# Patient Record
Sex: Male | Born: 1940 | Race: Black or African American | Hispanic: No | Marital: Married | Smoking: Former smoker
Health system: Southern US, Community
[De-identification: ages and names within clinical notes are randomized; demographics above are authoritative.]

## PROBLEM LIST (undated history)

## (undated) DIAGNOSIS — M1990 Unspecified osteoarthritis, unspecified site: Secondary | ICD-10-CM

## (undated) DIAGNOSIS — J189 Pneumonia, unspecified organism: Secondary | ICD-10-CM

## (undated) DIAGNOSIS — I10 Essential (primary) hypertension: Secondary | ICD-10-CM

## (undated) DIAGNOSIS — Z973 Presence of spectacles and contact lenses: Secondary | ICD-10-CM

## (undated) DIAGNOSIS — K219 Gastro-esophageal reflux disease without esophagitis: Secondary | ICD-10-CM

## (undated) DIAGNOSIS — E119 Type 2 diabetes mellitus without complications: Secondary | ICD-10-CM

## (undated) DIAGNOSIS — J342 Deviated nasal septum: Secondary | ICD-10-CM

## (undated) DIAGNOSIS — C801 Malignant (primary) neoplasm, unspecified: Secondary | ICD-10-CM

## (undated) DIAGNOSIS — S83206A Unspecified tear of unspecified meniscus, current injury, right knee, initial encounter: Secondary | ICD-10-CM

## (undated) DIAGNOSIS — G4730 Sleep apnea, unspecified: Secondary | ICD-10-CM

## (undated) HISTORY — PX: OTHER SURGICAL HISTORY: SHX169

## (undated) HISTORY — PX: BACK SURGERY: SHX140

---

## 1999-02-12 ENCOUNTER — Ambulatory Visit (HOSPITAL_COMMUNITY): Admission: RE | Admit: 1999-02-12 | Discharge: 1999-02-12 | Payer: Self-pay | Admitting: Gastroenterology

## 1999-02-12 ENCOUNTER — Encounter (INDEPENDENT_AMBULATORY_CARE_PROVIDER_SITE_OTHER): Payer: Self-pay | Admitting: Specialist

## 2001-04-21 ENCOUNTER — Ambulatory Visit (HOSPITAL_BASED_OUTPATIENT_CLINIC_OR_DEPARTMENT_OTHER): Admission: RE | Admit: 2001-04-21 | Discharge: 2001-04-21 | Payer: Self-pay | Admitting: Geriatric Medicine

## 2001-12-28 ENCOUNTER — Encounter: Payer: Self-pay | Admitting: Geriatric Medicine

## 2001-12-28 ENCOUNTER — Encounter: Admission: RE | Admit: 2001-12-28 | Discharge: 2001-12-28 | Payer: Self-pay | Admitting: Geriatric Medicine

## 2002-10-24 ENCOUNTER — Encounter (INDEPENDENT_AMBULATORY_CARE_PROVIDER_SITE_OTHER): Payer: Self-pay | Admitting: *Deleted

## 2002-10-24 ENCOUNTER — Ambulatory Visit (HOSPITAL_BASED_OUTPATIENT_CLINIC_OR_DEPARTMENT_OTHER): Admission: RE | Admit: 2002-10-24 | Discharge: 2002-10-24 | Payer: Self-pay | Admitting: Otolaryngology

## 2005-12-22 ENCOUNTER — Encounter: Admission: RE | Admit: 2005-12-22 | Discharge: 2005-12-22 | Payer: Self-pay | Admitting: Geriatric Medicine

## 2006-02-22 ENCOUNTER — Encounter: Admission: RE | Admit: 2006-02-22 | Discharge: 2006-02-22 | Payer: Self-pay | Admitting: Geriatric Medicine

## 2008-07-20 DIAGNOSIS — C801 Malignant (primary) neoplasm, unspecified: Secondary | ICD-10-CM

## 2008-07-20 HISTORY — PX: PROSTATECTOMY: SHX69

## 2008-07-20 HISTORY — DX: Malignant (primary) neoplasm, unspecified: C80.1

## 2008-10-29 ENCOUNTER — Encounter (INDEPENDENT_AMBULATORY_CARE_PROVIDER_SITE_OTHER): Payer: Self-pay | Admitting: Urology

## 2008-10-29 ENCOUNTER — Inpatient Hospital Stay (HOSPITAL_COMMUNITY): Admission: RE | Admit: 2008-10-29 | Discharge: 2008-10-30 | Payer: Self-pay | Admitting: Urology

## 2010-03-24 ENCOUNTER — Emergency Department (HOSPITAL_COMMUNITY): Admission: EM | Admit: 2010-03-24 | Discharge: 2010-03-25 | Payer: Self-pay | Admitting: Emergency Medicine

## 2010-03-27 ENCOUNTER — Encounter: Admission: RE | Admit: 2010-03-27 | Discharge: 2010-03-27 | Payer: Self-pay | Admitting: Orthopedic Surgery

## 2010-09-29 ENCOUNTER — Other Ambulatory Visit: Payer: Self-pay | Admitting: Urology

## 2010-09-29 ENCOUNTER — Encounter (HOSPITAL_COMMUNITY): Payer: Medicare Other

## 2010-09-29 ENCOUNTER — Other Ambulatory Visit (HOSPITAL_COMMUNITY): Payer: Self-pay | Admitting: Urology

## 2010-09-29 ENCOUNTER — Ambulatory Visit (HOSPITAL_COMMUNITY)
Admission: RE | Admit: 2010-09-29 | Discharge: 2010-09-29 | Disposition: A | Discharge status: Home / Self Care | Payer: Medicare Other | Source: Ambulatory Visit | Attending: Urology | Admitting: Urology

## 2010-09-29 DIAGNOSIS — Z01818 Encounter for other preprocedural examination: Secondary | ICD-10-CM

## 2010-09-29 DIAGNOSIS — R9431 Abnormal electrocardiogram [ECG] [EKG]: Secondary | ICD-10-CM | POA: Insufficient documentation

## 2010-09-29 DIAGNOSIS — Z01810 Encounter for preprocedural cardiovascular examination: Secondary | ICD-10-CM | POA: Insufficient documentation

## 2010-09-29 DIAGNOSIS — Z01812 Encounter for preprocedural laboratory examination: Secondary | ICD-10-CM | POA: Insufficient documentation

## 2010-09-29 LAB — BASIC METABOLIC PANEL
CO2: 27 mEq/L (ref 19–32)
Chloride: 103 mEq/L (ref 96–112)
Creatinine, Ser: 1.24 mg/dL (ref 0.4–1.5)
GFR calc Af Amer: >60 mL/min (ref >60)
Glucose, Bld: 121 mg/dL — ABNORMAL HIGH (ref 70–99)
Sodium: 138 mEq/L (ref 135–145)

## 2010-09-29 LAB — CBC
MCH: 31.3 pg (ref 26.0–34.0)
MCHC: 34 g/dL (ref 30.0–36.0)
MCV: 91.9 fL (ref 78.0–100.0)
Platelets: 143 10*3/uL — ABNORMAL LOW (ref 150–400)
RBC: 4.8 MIL/uL (ref 4.22–5.81)

## 2010-09-29 LAB — SURGICAL PCR SCREEN: MRSA, PCR: NEGATIVE

## 2010-10-07 ENCOUNTER — Ambulatory Visit: Admission: RE | Admit: 2010-10-07 | Payer: Self-pay | Source: Home / Self Care | Admitting: Urology

## 2010-10-07 ENCOUNTER — Observation Stay (HOSPITAL_COMMUNITY)
Admission: RE | Admit: 2010-10-07 | Discharge: 2010-10-08 | Disposition: A | Discharge status: Home / Self Care | Payer: Medicare Other | Source: Ambulatory Visit | Attending: Urology | Admitting: Urology

## 2010-10-07 DIAGNOSIS — Z01811 Encounter for preprocedural respiratory examination: Secondary | ICD-10-CM | POA: Insufficient documentation

## 2010-10-07 DIAGNOSIS — Z79899 Other long term (current) drug therapy: Secondary | ICD-10-CM | POA: Insufficient documentation

## 2010-10-07 DIAGNOSIS — I10 Essential (primary) hypertension: Secondary | ICD-10-CM | POA: Insufficient documentation

## 2010-10-07 DIAGNOSIS — E119 Type 2 diabetes mellitus without complications: Secondary | ICD-10-CM | POA: Insufficient documentation

## 2010-10-07 DIAGNOSIS — G4733 Obstructive sleep apnea (adult) (pediatric): Secondary | ICD-10-CM | POA: Insufficient documentation

## 2010-10-07 DIAGNOSIS — N393 Stress incontinence (female) (male): Principal | ICD-10-CM | POA: Insufficient documentation

## 2010-10-07 DIAGNOSIS — Z01812 Encounter for preprocedural laboratory examination: Secondary | ICD-10-CM | POA: Insufficient documentation

## 2010-10-07 LAB — TYPE AND SCREEN: ABO/RH(D): B POS

## 2010-10-07 LAB — BASIC METABOLIC PANEL
BUN: 14 mg/dL (ref 6–23)
GFR calc non Af Amer: >60 mL/min (ref >60)
Glucose, Bld: 128 mg/dL — ABNORMAL HIGH (ref 70–99)
Potassium: 3.9 mEq/L (ref 3.5–5.1)

## 2010-10-07 LAB — GLUCOSE, CAPILLARY: Glucose-Capillary: 147 mg/dL — ABNORMAL HIGH (ref 70–99)

## 2010-10-07 LAB — HEMOGLOBIN AND HEMATOCRIT, BLOOD: Hemoglobin: 13.4 g/dL (ref 13.0–17.0)

## 2010-10-08 LAB — GLUCOSE, CAPILLARY
Glucose-Capillary: 171 mg/dL — ABNORMAL HIGH (ref 70–99)
Glucose-Capillary: 165 mg/dL — ABNORMAL HIGH (ref 70–99)

## 2010-10-08 LAB — BASIC METABOLIC PANEL
Calcium: 8.7 mg/dL (ref 8.4–10.5)
Chloride: 102 mEq/L (ref 96–112)
Creatinine, Ser: 1.21 mg/dL (ref 0.4–1.5)
GFR calc Af Amer: >60 mL/min (ref >60)
GFR calc non Af Amer: 59 mL/min — ABNORMAL LOW (ref >60)

## 2010-10-08 LAB — HEMOGLOBIN AND HEMATOCRIT, BLOOD
HCT: 36.7 % — ABNORMAL LOW (ref 39.0–52.0)
Hemoglobin: 12.5 g/dL — ABNORMAL LOW (ref 13.0–17.0)

## 2010-10-14 NOTE — Op Note (Signed)
NAME:  SHAFIQ, LARCH NO.:  [id number]  MEDICAL RECORD NO.:  [id number]           PATIENT TYPE:  O  LOCATION:  DAYL                         FACILITY:  [facility]  PHYSICIAN:  Martina Sinner, MD DATE OF BIRTH:  November 01, 1940  DATE OF PROCEDURE:  10/07/2010 DATE OF DISCHARGE: Surgical Assistant: Delia Chimes                              OPERATIVE REPORT   PREOPERATIVE DIAGNOSIS:  Stress urinary incontinence.  POSTOPERATIVE DIAGNOSIS:  Stress urinary incontinence.  SURGERY:  Male sling plus cystoscopy.  INDICATIONS FOR PROCEDURE:  Mr. Sofranko has the above diagnosis.  He consented to the above procedure.  DESCRIPTION OF PROCEDURE:  The patient is prepped and draped in usual fashion.  Preoperative laboratory tests were normal.  Preoperative antibiotics were given.  Extra care was taken with leg positioning to minimize the risk of compartment syndrome, neuropathy, and DVT.  He had a short perineum.  I felt his adductor tendon easily, especially on the left and planned my incisions.  I made approximately 6 cm perineal incision that was extended 2 cm into the scrotal area due to the short perineum.  A 14-French Foley catheter had been inserted.  I dissected sharply and with cautery down through the subcutaneous tissue, mobilized the soft tissue from the underlying bulbous spongiosis muscle. The usual retractors were utilized.  I split the bulbous spongiosis muscle in the midline and mobilized it modestly from the overlying muscle and could feel the tendon and the perineum.  I was very diligent in marking the perineal body with the 3-0 Vicryl relative to the rectum. Appropriate attachments were taken down.  A 3-0 Vicryl was placed through the tendon at this point.  I then spent a lot of time marking the upper aspect of the obturator foramen below the adductor tendon.  I could feel the tendon bilaterally. I made an appropriate incision after using marking pen.   I used a trocar needle to hit the bone medially and passed it straight through the obturator foramen.  Visually, I placed the pulp my left index finger in the perineum at the triangle between the inferior rami and the urethra.  I then passed the AdVance needle with a double pop through the obturator foramen staying at an angle to stay away from the adductor tendon.  I rotated medially on to the pulp of my index finger.  This went very nicely.  I passed the sling in an anatomic position and brought it up through some of its length.  I did the exact same procedure on the other side.  I then brought the 3-0 Vicryl suture through the middle aspect of the sling.  I used my folding technique to place the sling right down on the perineal tendon and gently pulled on both straps causing cephalad rotation of the bulbar urethra.  This went very nicely.  I had cystoscoped the patient after marking the perineal tendon early in the case and with the appropriate maneuver, there was nice winking of the bladder neck and sphincter.  The rest of the penile urethra was normal.  Bladder mucosa and trigone were also normal.  After gently pulling down on  the sling, I re-cystoscoped the patient. There was nice coaptation of the sphincter.  There is no question the sling was acting at the level of the sphincter and not distally.  There was appropriate relative straightening of the bulbar urethra.  Copious irrigation was utilized.  I did an anatomic closure with 3 layers of 3-0 Vicryl followed by 4-0 subcuticularly.  I cut the sling straps distal to the blue dot and then brought them out through separate stab incisions made 2 cm posterior to the initial incisions.  With a hemostat, I brought the straps through the second incision.  I cut below the blue dots and removed the sheath.  I cut the sling at the skin level.  Four inguinal incisions were closed with interrupted 4-0 Vicryl.  Dermabond was applied  to all incisions.  Perineal fluff dressing was applied to maximize hemostasis post procedure.  Leg position was good at the end of the case.  Foley catheter was draining clear urine at the end of the case.  I was very pleased with Mr. Moravek operation, and hopefully he reaches his treatment goal.          ______________________________ Martina Sinner, MD     SAM/MEDQ  D:  10/07/2010  T:  10/07/2010  Job:  [id number]  Electronically Signed by Alfredo Martinez MD on 10/14/2010 12:55:58 PM

## 2010-10-29 LAB — COMPREHENSIVE METABOLIC PANEL
ALT: 28 U/L (ref 0–53)
AST: 22 U/L (ref 0–37)
Albumin: 4 g/dL (ref 3.5–5.2)
Alkaline Phosphatase: 61 U/L (ref 39–117)
BUN: 10 mg/dL (ref 6–23)
CO2: 27 mEq/L (ref 19–32)
Calcium: 9.7 mg/dL (ref 8.4–10.5)
Chloride: 110 mEq/L (ref 96–112)
Creatinine, Ser: 1.13 mg/dL (ref 0.4–1.5)
GFR calc Af Amer: >60 mL/min (ref >60)
GFR calc non Af Amer: >60 mL/min (ref >60)
Glucose, Bld: 142 mg/dL — ABNORMAL HIGH (ref 70–99)
Potassium: 4.3 mEq/L (ref 3.5–5.1)
Sodium: 143 mEq/L (ref 135–145)
Total Bilirubin: 0.8 mg/dL (ref 0.3–1.2)
Total Protein: 6.9 g/dL (ref 6.0–8.3)

## 2010-10-29 LAB — DIFFERENTIAL
Basophils Absolute: 0 10*3/uL (ref 0.0–0.1)
Basophils Absolute: 0 10*3/uL (ref 0.0–0.1)
Basophils Absolute: 0.1 10*3/uL (ref 0.0–0.1)
Basophils Relative: 0 % (ref 0–1)
Basophils Relative: 0 % (ref 0–1)
Basophils Relative: 1 % (ref 0–1)
Eosinophils Absolute: 0 10*3/uL (ref 0.0–0.7)
Eosinophils Absolute: 0 10*3/uL (ref 0.0–0.7)
Eosinophils Absolute: 0.1 10*3/uL (ref 0.0–0.7)
Eosinophils Relative: 0 % (ref 0–5)
Eosinophils Relative: 0 % (ref 0–5)
Eosinophils Relative: 2 % (ref 0–5)
Lymphocytes Relative: 17 % (ref 12–46)
Lymphocytes Relative: 27 % (ref 12–46)
Lymphocytes Relative: 8 % — ABNORMAL LOW (ref 12–46)
Lymphs Abs: 1.1 10*3/uL (ref 0.7–4.0)
Lymphs Abs: 1.5 10*3/uL (ref 0.7–4.0)
Lymphs Abs: 1.7 10*3/uL (ref 0.7–4.0)
Monocytes Absolute: 0.5 10*3/uL (ref 0.1–1.0)
Monocytes Absolute: 0.9 10*3/uL (ref 0.1–1.0)
Monocytes Absolute: 1.2 10*3/uL — ABNORMAL HIGH (ref 0.1–1.0)
Monocytes Relative: 13 % — ABNORMAL HIGH (ref 3–12)
Monocytes Relative: 6 % (ref 3–12)
Monocytes Relative: 8 % (ref 3–12)
Neutro Abs: 12.4 10*3/uL — ABNORMAL HIGH (ref 1.7–7.7)
Neutro Abs: 3.8 10*3/uL (ref 1.7–7.7)
Neutro Abs: 6.2 10*3/uL (ref 1.7–7.7)
Neutrophils Relative %: 62 % (ref 43–77)
Neutrophils Relative %: 70 % (ref 43–77)
Neutrophils Relative %: 86 % — ABNORMAL HIGH (ref 43–77)

## 2010-10-29 LAB — CBC
HCT: 38.4 % — ABNORMAL LOW (ref 39.0–52.0)
HCT: 41.5 % (ref 39.0–52.0)
HCT: 42.2 % (ref 39.0–52.0)
Hemoglobin: 13.1 g/dL (ref 13.0–17.0)
Hemoglobin: 13.9 g/dL (ref 13.0–17.0)
Hemoglobin: 14.1 g/dL (ref 13.0–17.0)
MCHC: 33.3 g/dL (ref 30.0–36.0)
MCHC: 33.5 g/dL (ref 30.0–36.0)
MCHC: 34.2 g/dL (ref 30.0–36.0)
MCV: 95.5 fL (ref 78.0–100.0)
MCV: 96.4 fL (ref 78.0–100.0)
MCV: 97.5 fL (ref 78.0–100.0)
Platelets: 119 10*3/uL — ABNORMAL LOW (ref 150–400)
Platelets: 132 10*3/uL — ABNORMAL LOW (ref 150–400)
Platelets: 136 10*3/uL — ABNORMAL LOW (ref 150–400)
RBC: 3.98 MIL/uL — ABNORMAL LOW (ref 4.22–5.81)
RBC: 4.33 MIL/uL (ref 4.22–5.81)
RBC: 4.35 MIL/uL (ref 4.22–5.81)
RDW: 14.1 % (ref 11.5–15.5)
RDW: 14.1 % (ref 11.5–15.5)
RDW: 14.8 % (ref 11.5–15.5)
WBC: 14.5 10*3/uL — ABNORMAL HIGH (ref 4.0–10.5)
WBC: 6.2 10*3/uL (ref 4.0–10.5)
WBC: 8.9 10*3/uL (ref 4.0–10.5)

## 2010-10-29 LAB — BASIC METABOLIC PANEL
BUN: 5 mg/dL — ABNORMAL LOW (ref 6–23)
BUN: 8 mg/dL (ref 6–23)
CO2: 27 mEq/L (ref 19–32)
CO2: 27 mEq/L (ref 19–32)
Calcium: 8 mg/dL — ABNORMAL LOW (ref 8.4–10.5)
Calcium: 8.5 mg/dL (ref 8.4–10.5)
Chloride: 109 mEq/L (ref 96–112)
Chloride: 109 mEq/L (ref 96–112)
Creatinine, Ser: 1.05 mg/dL (ref 0.4–1.5)
Creatinine, Ser: 1.07 mg/dL (ref 0.4–1.5)
GFR calc Af Amer: >60 mL/min (ref >60)
GFR calc Af Amer: >60 mL/min (ref >60)
GFR calc non Af Amer: >60 mL/min (ref >60)
GFR calc non Af Amer: >60 mL/min (ref >60)
Glucose, Bld: 168 mg/dL — ABNORMAL HIGH (ref 70–99)
Glucose, Bld: 182 mg/dL — ABNORMAL HIGH (ref 70–99)
Potassium: 4 mEq/L (ref 3.5–5.1)
Potassium: 4.4 mEq/L (ref 3.5–5.1)
Sodium: 139 mEq/L (ref 135–145)
Sodium: 140 mEq/L (ref 135–145)

## 2010-10-29 LAB — TYPE AND SCREEN
ABO/RH(D): B POS
Antibody Screen: NEGATIVE

## 2010-10-29 LAB — ABO/RH: ABO/RH(D): B POS

## 2010-12-02 NOTE — Op Note (Signed)
[name], [name]           ACCOUNT NO.:  [id number]   MEDICAL RECORD NO.:  [id number]          PATIENT TYPE:  INP   LOCATION:  1443                         [facility]ACILITY:  [facility]   PHYSICIAN:  Valetta Fuller, M.D.  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   PROCEDURE PERFORMED:  Robotic-assisted laparoscopic radical retropubic  prostatectomy with bilateral pelvic lymph node dissections.   SURGEON:  Valetta Fuller, M.D.   ASSISTANT:  Delia Chimes, nurse practitioner.   ANESTHESIA:  General endotracheal   INDICATIONS:  [name] is currently 70 years of age.  The patient  was seen and evaluated several times over the last year or two with a  family history of prostate cancer.  Prior PSA has been within normal  limits.  Recently, the patient's PSA increased.  His PSA went up to 5.4.  Ultrasound revealed a 30-g prostate.  All left-sided biopsies were  negative, but on the right, the patient had every core involved with  Gleason's  6-8 adenocarcinoma of the prostate.  Primary Gleason  component was score 3 with secondary components of the 3 through 5.  The  patient underwent extensive counseling with regard to treatment options.  The patient elected to proceed with a robotic prostatectomy.  The  patient appeared to understand the advantages and disadvantages of this  procedure as well as the complications.  We specifically spent  considerable time discussing incontinence and sexual dysfunction issues.  He was felt to be a candidate only for unilateral nerve spare given the  situation.  The patient now presents for the procedure.  He has done a  mechanical bowel prep and has received perioperative  Unasyn with  placement of compression boots.   TECHNIQUE AND FINDINGS:  The patient was brought to the operating room.  There, he was placed in the mid-lithotomy position.  All extremities  were carefully padded.  The patient had successful induction of general  endotracheal anesthesia.  He was then secured carefully to the operative  table, placed in steep Trendelenburg position and prepped and draped in  the usual manner.  Appropriate surgical time-out was then performed.   Foley catheter was placed sterilely on the field.  A camera port  incision was chosen 18 cm above the pubic symphysis just to the left of  the umbilicus.  A standard open Hassan technique was utilized and  abdominal entry as well as initial insufflation occurred without  incident.  All other trocars were placed with direct visual guidance  including three 8-mm robotic trocars and a 12 and 5-mm assist port.  Once all trocars were positioned, the surgical cart was docked.  The  bladder was filled to help identify it, and the space of Retzius was  developed with electrocautery scissors and blunt dissecting technique.  Superficial fascia over the bladder neck and endopelvic fascia was taken  with electrocautery scissors.  Endopelvic fascia was then incised from  base to apex.  Levator musculature was swept off the apex of the  prostate, isolating the dorsal venous complex which was then stapled.  Hemostasis was excellent.  Anterior bladder neck was identified with the  aid of a Foley balloon and transected with electrocautery scissors.  Indigo carmine was given, and we were well away from the ureteral  orifices.  There was no evidence of middle lobe and the posterior  bladder neck was then transected.  The vas deferens and seminal vesicles  were then found posteriorly and individually dissected free.  The plane  between the rectum and prostate was then developed with a combination of  sharp and blunt dissecting  technique.   The patient was felt to be a candidate for unilateral nerve spare.  Superficial fascia on the prostate was incised anterior laterally and  then swept posteriorly.  The neurovascular plane was easily established  and then developed from apex to the base of the prostate isolating the  neurovascular bundle on that side.  The pedicle of prostate was then  taken down with locking clips.  On the right side of the prostate, we  took the tissue widely incorporating the pedicle along with the  neurovascular bundle tissue as laterally as possible up to the apex of  the prostate.  The anterior urethra was then transected leaving a nice  urethral stump, and the posterior urethra was then transected with cold  scissors.  A small amount of recti urethralis tissue was incised, and  the prostate specimen was removed from the pelvis.  Hemostasis remained  excellent.  The pelvis was irrigated and rectal insufflation revealed no  evidence of rectal injury.   Bilateral pelvic lymph node dissection was then performed.  The  obturator packets were taken up to the bifurcation of the iliac artery.  Obturator nerves were identified bilaterally and preserved.  Of note, we  also sent some of the superficial fat off the prostate and bladder neck  for analysis for any possible lymph node tissue.  Clips were used for  small lymphatic channels and small venous vessels.   Attention was then turned towards reconstruction.  The posterior bladder  neck and posterior urethra were reapproximated with interrupted Vicryl  suture to reapproximate those structures and to reduce tension.  The  bladder neck did not require any reconstruction.  The rest of the  anastomosis was done with a double-armed 3-0 Monocryl suture and in a  360-degree running manner.  New catheter was placed, and irrigation  revealed clear blue urine without evidence of extravasation.  A pelvic  drain was placed through one of the trocar  ports and secured to the  skin.  This was placed in the retropubic space in the area of  anastomosis.  The specimen was placed an Endopouch retrieval bag.  The  12-mm trocar site was closed with a Vicryl suture with the aid of a  suture passer.  All other trocars were taken with direct visual  guidance.  The camera port incision was extended slightly to allow for  removal of the specimen.  That fascia was then closed with a running #1  Vicryl suture.  All incisions were infiltrated with Marcaine and closed  with clips.  Estimated blood loss 100 mL.  The patient tolerated things  well.  There were no obvious complications or difficulties, and he was  brought to the recovery room in stable condition.  Valetta Fuller, M.D.  Electronically Signed     DSG/MEDQ  D:  10/29/2008  T:  10/29/2008  Job:  [id number]

## 2010-12-02 NOTE — Discharge Summary (Signed)
[name], [name]           ACCOUNT NO.:  [id number]   MEDICAL RECORD NO.:  [id number]          PATIENT TYPE:  INP   LOCATION:  1443                         [facility]ACILITY:  [facility]   PHYSICIAN:  Valetta Fuller, M.D.  DATE OF BIRTH:  1940-09-04   DATE OF ADMISSION:  10/29/2008  DATE OF DISCHARGE:  10/30/2008                               DISCHARGE SUMMARY   ADMISSION DIAGNOSIS:  Intermediate risk clinical T1c adenocarcinoma of  the prostate.   DISCHARGE DIAGNOSIS:  Intermediate risk clinical T1c adenocarcinoma of  the prostate.   PROCEDURES:  1. Robotic-assisted laparoscopic radical prostatectomy (left side      nerve sparing).  2. Bilateral pelvic lymphadenectomy.   HISTORY AND PHYSICAL:  For full details, please see admission history  and physical.  Briefly, [name] is a 70 year old gentleman who  was found to have clinically localized adenocarcinoma of the prostate.  After careful consideration regarding management options for treatment,  he elected to proceed with surgical therapy and a robotic-assisted  radical prostatectomy.   HOSPITAL COURSE:  On October 29, 2008, he was taken to the operating room  and underwent the above-named procedure which he tolerated well and  without complications.  Postoperatively, he was able to be transferred  to a regular hospital room following recovery from anesthesia.  He was  able to begin ambulation that evening.  He remained hemodynamically  stable.  His postoperative hematocrit was 42.2.  On the morning of  postoperative day #1, his hematocrit was also found to be stable at  38.4.  he maintained excellent urine output with minimal output from his  pelvic drain, therefore, the pelvic drain was removed.  He was placed on  a clear liquid diet and continued to ambulate.  Upon reevaluation in the  afternoon of postoperative day #1, his urine output, blood pressure and  pulse all remained stable.  He was also able to tolerate his  clear  liquid diet.  Therefore, he was felt to be stable for discharge as he  met all discharge criteria.   DISPOSITION:  Home.   DISCHARGE MEDICATIONS:  He was instructed to resume his regular home  medications including amlodipine, Lipitor, fosinopril, and metoprolol.  He was instructed to hold all multivitamins, aspirins and herbal  supplements for 10 days postoperatively.  In addition, he was provided a  prescription for Vicodin to take as needed for pain and told to use  Colace as a stool softener.  He was also given a prescription for Cipro  to begin 2 days prior to his return for removal of a Foley catheter.   DISCHARGE INSTRUCTIONS:  He was instructed to be ambulatory but  specifically told to refrain from any heavy lifting, strenuous activity  or driving.  He was instructed on routine Foley catheter care and told  to gradually advance his diet over the course of the next few days.   FOLLOW UP:  He will follow up in 7 to 10 days for Foley catheter removal  and skin staple removal.      Delia Chimes, NP      Valetta Fuller, M.D.  Electronically Signed    MA/MEDQ  D:  10/30/2008  T:  10/30/2008  Job:  [id number]

## 2010-12-05 NOTE — Op Note (Signed)
   NAME:  [name], [name]                     ACCOUNT NO.:  [id number]   MEDICAL RECORD NO.:  [id number]                   PATIENT TYPE:  AMB   LOCATION:  DSC                                  FACILITY:  [facility]   PHYSICIAN:  Kristine Garbe. Ezzard Standing, M.D.         DATE OF BIRTH:  [birth year]   DATE OF PROCEDURE:  10/24/2002  DATE OF DISCHARGE:                                 OPERATIVE REPORT   PREOPERATIVE DIAGNOSIS:  Left neck node.   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   OPERATION PERFORMED:  Excision of posterior left neck node.   SURGEON:  Kristine Garbe. Ezzard Standing, M.D.   ANESTHESIA:  General endotracheal,   COMPLICATIONS:  None.   INDICATIONS FOR PROCEDURE:  The patient is a 70 year old gentleman who has  noticed a nodule in the left posterior neck for about two months now.  He is  otherwise asymptomatic.  On examination he has an approximately 0.5 to 2 cm  soft node lying just posterior to the sternocleidomastoid muscle of the left  upper neck.  The patient was taken to the operating room at this time for  excisional biopsy.   DESCRIPTION OF PROCEDURE:  The left neck was prepped with Betadine solution,  draped out with sterile towels.  The proposed incision site was marked and  injected with Xylocaine with epinephrine.  An incision was made directly  over the node.  Dissection was carried down through the subcutaneous tissue  down to the posterior aspect of the sternocleidomastoid muscle.  There is a  mass consistent with a probable lipoma within the posterior aspect of the  left sternocleidomastoid muscle.  This was dissected out and sent to  pathology.  There is no other palpable nodules.  Hemostasis was obtained  with the cautery.  The wound was closed with 3-0 chromic suture  subcutaneously and a running 5-0 Vicryl subcuticular stitch.  Steri-Strips  were placed.  The patient was awakened from anesthesia and transferred to  the  recovery room postoperatively doing well.   DISPOSITION:  The patient will be discharged to home later this morning.  Will have him follow up in my office in one week for a recheck and review  pathology.  He was given Tylenol #3 one to two every four hours as needed  for pain.                                               Kristine Garbe. Ezzard Standing, M.D.   CEN/MEDQ  D:  10/24/2002  T:  10/24/2002  Job:  [id number]   cc:   Hal T. Stoneking, M.D.  [address] [place], [place] [address]  Fax: [phone]

## 2010-12-09 ENCOUNTER — Other Ambulatory Visit: Payer: Self-pay | Admitting: Geriatric Medicine

## 2010-12-09 DIAGNOSIS — M6281 Muscle weakness (generalized): Secondary | ICD-10-CM

## 2010-12-17 ENCOUNTER — Ambulatory Visit
Admission: RE | Admit: 2010-12-17 | Discharge: 2010-12-17 | Disposition: A | Discharge status: Home / Self Care | Payer: Medicare Other | Source: Ambulatory Visit | Attending: Geriatric Medicine | Admitting: Geriatric Medicine

## 2010-12-17 DIAGNOSIS — M6281 Muscle weakness (generalized): Secondary | ICD-10-CM

## 2010-12-17 MED ORDER — GADOBENATE DIMEGLUMINE 529 MG/ML IV SOLN
20.0000 mL | Freq: Once | INTRAVENOUS | Status: AC | PRN
  Administered 2010-12-17: 20 mL via INTRAVENOUS

## 2011-02-12 ENCOUNTER — Ambulatory Visit: Payer: Medicare Other | Attending: Geriatric Medicine | Admitting: Rehabilitative and Restorative Service Providers"

## 2011-02-12 DIAGNOSIS — IMO0001 Reserved for inherently not codable concepts without codable children: Secondary | ICD-10-CM | POA: Insufficient documentation

## 2011-02-12 DIAGNOSIS — M6281 Muscle weakness (generalized): Secondary | ICD-10-CM | POA: Insufficient documentation

## 2011-02-25 ENCOUNTER — Ambulatory Visit: Payer: Medicare Other | Attending: Geriatric Medicine | Admitting: Rehabilitative and Restorative Service Providers"

## 2011-02-25 DIAGNOSIS — IMO0001 Reserved for inherently not codable concepts without codable children: Secondary | ICD-10-CM | POA: Insufficient documentation

## 2011-02-25 DIAGNOSIS — M6281 Muscle weakness (generalized): Secondary | ICD-10-CM | POA: Insufficient documentation

## 2011-02-27 ENCOUNTER — Ambulatory Visit: Payer: Medicare Other | Admitting: Rehabilitative and Restorative Service Providers"

## 2011-03-03 ENCOUNTER — Ambulatory Visit: Payer: Medicare Other | Admitting: Rehabilitative and Restorative Service Providers"

## 2011-03-05 ENCOUNTER — Ambulatory Visit: Payer: Medicare Other | Admitting: Rehabilitative and Restorative Service Providers"

## 2011-03-10 ENCOUNTER — Ambulatory Visit: Payer: Medicare Other

## 2011-03-12 ENCOUNTER — Ambulatory Visit: Payer: Medicare Other

## 2011-03-17 ENCOUNTER — Ambulatory Visit: Payer: Medicare Other | Admitting: Rehabilitative and Restorative Service Providers"

## 2011-03-18 ENCOUNTER — Ambulatory Visit: Payer: Medicare Other | Admitting: Rehabilitative and Restorative Service Providers"

## 2011-03-31 ENCOUNTER — Ambulatory Visit: Payer: Medicare Other | Attending: Geriatric Medicine | Admitting: Rehabilitative and Restorative Service Providers"

## 2011-03-31 DIAGNOSIS — M6281 Muscle weakness (generalized): Secondary | ICD-10-CM | POA: Insufficient documentation

## 2011-03-31 DIAGNOSIS — IMO0001 Reserved for inherently not codable concepts without codable children: Secondary | ICD-10-CM | POA: Insufficient documentation

## 2011-04-02 ENCOUNTER — Ambulatory Visit: Payer: Medicare Other | Admitting: Rehabilitative and Restorative Service Providers"

## 2011-04-06 ENCOUNTER — Ambulatory Visit: Payer: Medicare Other

## 2011-04-08 ENCOUNTER — Ambulatory Visit: Payer: Medicare Other

## 2011-04-13 ENCOUNTER — Ambulatory Visit: Payer: Medicare Other | Admitting: Rehabilitative and Restorative Service Providers"

## 2011-04-15 ENCOUNTER — Ambulatory Visit: Payer: Medicare Other | Admitting: Rehabilitative and Restorative Service Providers"

## 2011-04-22 ENCOUNTER — Ambulatory Visit: Payer: Medicare Other | Attending: Geriatric Medicine | Admitting: Rehabilitative and Restorative Service Providers"

## 2011-04-22 DIAGNOSIS — M6281 Muscle weakness (generalized): Secondary | ICD-10-CM | POA: Insufficient documentation

## 2011-04-22 DIAGNOSIS — IMO0001 Reserved for inherently not codable concepts without codable children: Secondary | ICD-10-CM | POA: Insufficient documentation

## 2011-04-24 ENCOUNTER — Ambulatory Visit: Payer: Medicare Other | Admitting: Rehabilitative and Restorative Service Providers"

## 2011-04-27 ENCOUNTER — Ambulatory Visit: Payer: Medicare Other | Admitting: Rehabilitation

## 2011-04-29 ENCOUNTER — Ambulatory Visit: Payer: Medicare Other | Admitting: Rehabilitative and Restorative Service Providers"

## 2011-05-04 ENCOUNTER — Ambulatory Visit: Payer: Medicare Other | Admitting: Rehabilitative and Restorative Service Providers"

## 2011-05-06 ENCOUNTER — Ambulatory Visit: Payer: Medicare Other | Admitting: Rehabilitative and Restorative Service Providers"

## 2011-08-24 DIAGNOSIS — E119 Type 2 diabetes mellitus without complications: Secondary | ICD-10-CM | POA: Diagnosis not present

## 2011-08-24 DIAGNOSIS — I10 Essential (primary) hypertension: Secondary | ICD-10-CM | POA: Diagnosis not present

## 2011-08-24 DIAGNOSIS — E7800 Pure hypercholesterolemia, unspecified: Secondary | ICD-10-CM | POA: Diagnosis not present

## 2011-08-24 DIAGNOSIS — Z79899 Other long term (current) drug therapy: Secondary | ICD-10-CM | POA: Diagnosis not present

## 2011-08-24 DIAGNOSIS — H40019 Open angle with borderline findings, low risk, unspecified eye: Secondary | ICD-10-CM | POA: Diagnosis not present

## 2011-08-25 DIAGNOSIS — I10 Essential (primary) hypertension: Secondary | ICD-10-CM | POA: Diagnosis not present

## 2011-08-25 DIAGNOSIS — E7800 Pure hypercholesterolemia, unspecified: Secondary | ICD-10-CM | POA: Diagnosis not present

## 2011-08-25 DIAGNOSIS — Z79899 Other long term (current) drug therapy: Secondary | ICD-10-CM | POA: Diagnosis not present

## 2011-08-25 DIAGNOSIS — E119 Type 2 diabetes mellitus without complications: Secondary | ICD-10-CM | POA: Diagnosis not present

## 2011-10-12 DIAGNOSIS — C61 Malignant neoplasm of prostate: Secondary | ICD-10-CM | POA: Diagnosis not present

## 2011-10-13 DIAGNOSIS — H4010X0 Unspecified open-angle glaucoma, stage unspecified: Secondary | ICD-10-CM | POA: Diagnosis not present

## 2011-10-13 DIAGNOSIS — H409 Unspecified glaucoma: Secondary | ICD-10-CM | POA: Diagnosis not present

## 2011-10-15 DIAGNOSIS — C61 Malignant neoplasm of prostate: Secondary | ICD-10-CM | POA: Diagnosis not present

## 2011-10-15 DIAGNOSIS — N529 Male erectile dysfunction, unspecified: Secondary | ICD-10-CM | POA: Diagnosis not present

## 2011-10-15 DIAGNOSIS — N393 Stress incontinence (female) (male): Secondary | ICD-10-CM | POA: Diagnosis not present

## 2012-02-23 DIAGNOSIS — Z79899 Other long term (current) drug therapy: Secondary | ICD-10-CM | POA: Diagnosis not present

## 2012-02-23 DIAGNOSIS — Z0000 Encounter for general adult medical examination without abnormal findings: Secondary | ICD-10-CM | POA: Diagnosis not present

## 2012-02-23 DIAGNOSIS — Z23 Encounter for immunization: Secondary | ICD-10-CM | POA: Diagnosis not present

## 2012-02-23 DIAGNOSIS — I10 Essential (primary) hypertension: Secondary | ICD-10-CM | POA: Diagnosis not present

## 2012-02-23 DIAGNOSIS — E7800 Pure hypercholesterolemia, unspecified: Secondary | ICD-10-CM | POA: Diagnosis not present

## 2012-02-23 DIAGNOSIS — Z1331 Encounter for screening for depression: Secondary | ICD-10-CM | POA: Diagnosis not present

## 2012-02-23 DIAGNOSIS — E119 Type 2 diabetes mellitus without complications: Secondary | ICD-10-CM | POA: Diagnosis not present

## 2012-02-24 DIAGNOSIS — M722 Plantar fascial fibromatosis: Secondary | ICD-10-CM | POA: Diagnosis not present

## 2012-03-03 DIAGNOSIS — M722 Plantar fascial fibromatosis: Secondary | ICD-10-CM | POA: Diagnosis not present

## 2012-03-10 ENCOUNTER — Other Ambulatory Visit: Payer: Self-pay | Admitting: Gastroenterology

## 2012-03-10 DIAGNOSIS — D128 Benign neoplasm of rectum: Secondary | ICD-10-CM | POA: Diagnosis not present

## 2012-03-10 DIAGNOSIS — D129 Benign neoplasm of anus and anal canal: Secondary | ICD-10-CM | POA: Diagnosis not present

## 2012-03-10 DIAGNOSIS — D126 Benign neoplasm of colon, unspecified: Secondary | ICD-10-CM | POA: Diagnosis not present

## 2012-03-10 DIAGNOSIS — Z09 Encounter for follow-up examination after completed treatment for conditions other than malignant neoplasm: Secondary | ICD-10-CM | POA: Diagnosis not present

## 2012-03-10 DIAGNOSIS — Z86010 Personal history of colonic polyps: Secondary | ICD-10-CM | POA: Diagnosis not present

## 2012-03-31 DIAGNOSIS — M722 Plantar fascial fibromatosis: Secondary | ICD-10-CM | POA: Diagnosis not present

## 2012-04-07 DIAGNOSIS — C61 Malignant neoplasm of prostate: Secondary | ICD-10-CM | POA: Diagnosis not present

## 2012-04-12 DIAGNOSIS — H409 Unspecified glaucoma: Secondary | ICD-10-CM | POA: Diagnosis not present

## 2012-04-12 DIAGNOSIS — H4011X0 Primary open-angle glaucoma, stage unspecified: Secondary | ICD-10-CM | POA: Diagnosis not present

## 2012-04-14 DIAGNOSIS — N529 Male erectile dysfunction, unspecified: Secondary | ICD-10-CM | POA: Diagnosis not present

## 2012-04-14 DIAGNOSIS — C61 Malignant neoplasm of prostate: Secondary | ICD-10-CM | POA: Diagnosis not present

## 2012-06-02 DIAGNOSIS — N529 Male erectile dysfunction, unspecified: Secondary | ICD-10-CM | POA: Diagnosis not present

## 2012-06-02 DIAGNOSIS — C61 Malignant neoplasm of prostate: Secondary | ICD-10-CM | POA: Diagnosis not present

## 2012-08-04 DIAGNOSIS — M722 Plantar fascial fibromatosis: Secondary | ICD-10-CM | POA: Diagnosis not present

## 2012-08-26 DIAGNOSIS — B354 Tinea corporis: Secondary | ICD-10-CM | POA: Diagnosis not present

## 2012-08-26 DIAGNOSIS — I10 Essential (primary) hypertension: Secondary | ICD-10-CM | POA: Diagnosis not present

## 2012-08-26 DIAGNOSIS — L989 Disorder of the skin and subcutaneous tissue, unspecified: Secondary | ICD-10-CM | POA: Diagnosis not present

## 2012-08-26 DIAGNOSIS — E119 Type 2 diabetes mellitus without complications: Secondary | ICD-10-CM | POA: Diagnosis not present

## 2012-08-26 DIAGNOSIS — Z79899 Other long term (current) drug therapy: Secondary | ICD-10-CM | POA: Diagnosis not present

## 2012-10-06 DIAGNOSIS — M722 Plantar fascial fibromatosis: Secondary | ICD-10-CM | POA: Diagnosis not present

## 2012-10-07 DIAGNOSIS — G479 Sleep disorder, unspecified: Secondary | ICD-10-CM | POA: Diagnosis not present

## 2012-10-07 DIAGNOSIS — G4730 Sleep apnea, unspecified: Secondary | ICD-10-CM | POA: Diagnosis not present

## 2012-10-13 DIAGNOSIS — H25019 Cortical age-related cataract, unspecified eye: Secondary | ICD-10-CM | POA: Diagnosis not present

## 2012-10-13 DIAGNOSIS — H4011X0 Primary open-angle glaucoma, stage unspecified: Secondary | ICD-10-CM | POA: Diagnosis not present

## 2012-10-13 DIAGNOSIS — H409 Unspecified glaucoma: Secondary | ICD-10-CM | POA: Diagnosis not present

## 2012-10-13 DIAGNOSIS — E119 Type 2 diabetes mellitus without complications: Secondary | ICD-10-CM | POA: Diagnosis not present

## 2012-11-14 DIAGNOSIS — G4733 Obstructive sleep apnea (adult) (pediatric): Secondary | ICD-10-CM | POA: Diagnosis not present

## 2013-01-19 DIAGNOSIS — C61 Malignant neoplasm of prostate: Secondary | ICD-10-CM | POA: Diagnosis not present

## 2013-01-23 DIAGNOSIS — E119 Type 2 diabetes mellitus without complications: Secondary | ICD-10-CM | POA: Diagnosis not present

## 2013-01-23 DIAGNOSIS — I10 Essential (primary) hypertension: Secondary | ICD-10-CM | POA: Diagnosis not present

## 2013-01-23 DIAGNOSIS — G4730 Sleep apnea, unspecified: Secondary | ICD-10-CM | POA: Diagnosis not present

## 2013-01-24 DIAGNOSIS — N529 Male erectile dysfunction, unspecified: Secondary | ICD-10-CM | POA: Diagnosis not present

## 2013-01-24 DIAGNOSIS — C61 Malignant neoplasm of prostate: Secondary | ICD-10-CM | POA: Diagnosis not present

## 2013-03-06 DIAGNOSIS — E119 Type 2 diabetes mellitus without complications: Secondary | ICD-10-CM | POA: Diagnosis not present

## 2013-03-06 DIAGNOSIS — Z79899 Other long term (current) drug therapy: Secondary | ICD-10-CM | POA: Diagnosis not present

## 2013-03-06 DIAGNOSIS — Z1331 Encounter for screening for depression: Secondary | ICD-10-CM | POA: Diagnosis not present

## 2013-03-06 DIAGNOSIS — I10 Essential (primary) hypertension: Secondary | ICD-10-CM | POA: Diagnosis not present

## 2013-03-06 DIAGNOSIS — G5600 Carpal tunnel syndrome, unspecified upper limb: Secondary | ICD-10-CM | POA: Diagnosis not present

## 2013-03-06 DIAGNOSIS — Z0000 Encounter for general adult medical examination without abnormal findings: Secondary | ICD-10-CM | POA: Diagnosis not present

## 2013-04-11 DIAGNOSIS — H4011X0 Primary open-angle glaucoma, stage unspecified: Secondary | ICD-10-CM | POA: Diagnosis not present

## 2013-04-11 DIAGNOSIS — H409 Unspecified glaucoma: Secondary | ICD-10-CM | POA: Diagnosis not present

## 2013-04-11 DIAGNOSIS — H25019 Cortical age-related cataract, unspecified eye: Secondary | ICD-10-CM | POA: Diagnosis not present

## 2013-04-11 DIAGNOSIS — E119 Type 2 diabetes mellitus without complications: Secondary | ICD-10-CM | POA: Diagnosis not present

## 2013-10-09 DIAGNOSIS — I10 Essential (primary) hypertension: Secondary | ICD-10-CM | POA: Diagnosis not present

## 2013-10-09 DIAGNOSIS — E119 Type 2 diabetes mellitus without complications: Secondary | ICD-10-CM | POA: Diagnosis not present

## 2013-10-09 DIAGNOSIS — E7800 Pure hypercholesterolemia, unspecified: Secondary | ICD-10-CM | POA: Diagnosis not present

## 2013-10-09 DIAGNOSIS — Z79899 Other long term (current) drug therapy: Secondary | ICD-10-CM | POA: Diagnosis not present

## 2013-10-09 DIAGNOSIS — G479 Sleep disorder, unspecified: Secondary | ICD-10-CM | POA: Diagnosis not present

## 2013-10-10 DIAGNOSIS — H25019 Cortical age-related cataract, unspecified eye: Secondary | ICD-10-CM | POA: Diagnosis not present

## 2013-10-10 DIAGNOSIS — E119 Type 2 diabetes mellitus without complications: Secondary | ICD-10-CM | POA: Diagnosis not present

## 2013-10-10 DIAGNOSIS — H2510 Age-related nuclear cataract, unspecified eye: Secondary | ICD-10-CM | POA: Diagnosis not present

## 2013-11-06 DIAGNOSIS — C61 Malignant neoplasm of prostate: Secondary | ICD-10-CM | POA: Diagnosis not present

## 2013-11-06 DIAGNOSIS — R972 Elevated prostate specific antigen [PSA]: Secondary | ICD-10-CM | POA: Diagnosis not present

## 2013-11-09 DIAGNOSIS — N529 Male erectile dysfunction, unspecified: Secondary | ICD-10-CM | POA: Diagnosis not present

## 2013-11-09 DIAGNOSIS — C61 Malignant neoplasm of prostate: Secondary | ICD-10-CM | POA: Diagnosis not present

## 2013-11-09 DIAGNOSIS — N393 Stress incontinence (female) (male): Secondary | ICD-10-CM | POA: Diagnosis not present

## 2013-11-10 DIAGNOSIS — H04539 Neonatal obstruction of unspecified nasolacrimal duct: Secondary | ICD-10-CM | POA: Diagnosis not present

## 2013-11-10 DIAGNOSIS — H04229 Epiphora due to insufficient drainage, unspecified lacrimal gland: Secondary | ICD-10-CM | POA: Diagnosis not present

## 2013-12-29 DIAGNOSIS — IMO0002 Reserved for concepts with insufficient information to code with codable children: Secondary | ICD-10-CM | POA: Diagnosis not present

## 2014-01-08 ENCOUNTER — Other Ambulatory Visit: Payer: Self-pay | Admitting: Internal Medicine

## 2014-01-08 ENCOUNTER — Other Ambulatory Visit: Payer: Self-pay | Admitting: Geriatric Medicine

## 2014-01-08 DIAGNOSIS — N5089 Other specified disorders of the male genital organs: Secondary | ICD-10-CM

## 2014-01-08 DIAGNOSIS — N508 Other specified disorders of male genital organs: Secondary | ICD-10-CM | POA: Diagnosis not present

## 2014-01-08 DIAGNOSIS — R1031 Right lower quadrant pain: Secondary | ICD-10-CM

## 2014-01-08 DIAGNOSIS — R109 Unspecified abdominal pain: Secondary | ICD-10-CM | POA: Diagnosis not present

## 2014-01-10 ENCOUNTER — Ambulatory Visit
Admission: RE | Admit: 2014-01-10 | Discharge: 2014-01-10 | Disposition: A | Discharge status: Home / Self Care | Payer: Medicare Other | Source: Ambulatory Visit | Attending: Internal Medicine | Admitting: Internal Medicine

## 2014-01-10 DIAGNOSIS — N433 Hydrocele, unspecified: Secondary | ICD-10-CM | POA: Diagnosis not present

## 2014-01-10 DIAGNOSIS — N5089 Other specified disorders of the male genital organs: Secondary | ICD-10-CM

## 2014-01-10 DIAGNOSIS — N508 Other specified disorders of male genital organs: Secondary | ICD-10-CM | POA: Diagnosis not present

## 2014-01-13 ENCOUNTER — Ambulatory Visit
Admission: RE | Admit: 2014-01-13 | Discharge: 2014-01-13 | Disposition: A | Discharge status: Home / Self Care | Payer: Medicare Other | Source: Ambulatory Visit | Attending: Geriatric Medicine | Admitting: Geriatric Medicine

## 2014-01-13 DIAGNOSIS — M5126 Other intervertebral disc displacement, lumbar region: Secondary | ICD-10-CM | POA: Diagnosis not present

## 2014-01-13 DIAGNOSIS — R1031 Right lower quadrant pain: Secondary | ICD-10-CM

## 2014-01-13 DIAGNOSIS — M48061 Spinal stenosis, lumbar region without neurogenic claudication: Secondary | ICD-10-CM | POA: Diagnosis not present

## 2014-01-15 DIAGNOSIS — M549 Dorsalgia, unspecified: Secondary | ICD-10-CM | POA: Diagnosis not present

## 2014-01-31 DIAGNOSIS — Z6832 Body mass index (BMI) 32.0-32.9, adult: Secondary | ICD-10-CM | POA: Diagnosis not present

## 2014-01-31 DIAGNOSIS — M5126 Other intervertebral disc displacement, lumbar region: Secondary | ICD-10-CM | POA: Diagnosis not present

## 2014-01-31 DIAGNOSIS — I10 Essential (primary) hypertension: Secondary | ICD-10-CM | POA: Diagnosis not present

## 2014-02-22 DIAGNOSIS — M5126 Other intervertebral disc displacement, lumbar region: Secondary | ICD-10-CM | POA: Diagnosis not present

## 2014-02-22 DIAGNOSIS — Z6832 Body mass index (BMI) 32.0-32.9, adult: Secondary | ICD-10-CM | POA: Diagnosis not present

## 2014-03-19 DIAGNOSIS — H5000 Unspecified esotropia: Secondary | ICD-10-CM | POA: Diagnosis not present

## 2014-03-19 DIAGNOSIS — H25019 Cortical age-related cataract, unspecified eye: Secondary | ICD-10-CM | POA: Diagnosis not present

## 2014-03-19 DIAGNOSIS — H02839 Dermatochalasis of unspecified eye, unspecified eyelid: Secondary | ICD-10-CM | POA: Diagnosis not present

## 2014-03-20 DIAGNOSIS — E119 Type 2 diabetes mellitus without complications: Secondary | ICD-10-CM | POA: Diagnosis not present

## 2014-03-20 DIAGNOSIS — I10 Essential (primary) hypertension: Secondary | ICD-10-CM | POA: Diagnosis not present

## 2014-03-20 DIAGNOSIS — Z79899 Other long term (current) drug therapy: Secondary | ICD-10-CM | POA: Diagnosis not present

## 2014-03-20 DIAGNOSIS — Z0000 Encounter for general adult medical examination without abnormal findings: Secondary | ICD-10-CM | POA: Diagnosis not present

## 2014-03-20 DIAGNOSIS — H4920 Sixth [abducent] nerve palsy, unspecified eye: Secondary | ICD-10-CM | POA: Diagnosis not present

## 2014-03-20 DIAGNOSIS — E7800 Pure hypercholesterolemia, unspecified: Secondary | ICD-10-CM | POA: Diagnosis not present

## 2014-03-20 DIAGNOSIS — Z23 Encounter for immunization: Secondary | ICD-10-CM | POA: Diagnosis not present

## 2014-03-20 DIAGNOSIS — Z1331 Encounter for screening for depression: Secondary | ICD-10-CM | POA: Diagnosis not present

## 2014-03-20 DIAGNOSIS — H5000 Unspecified esotropia: Secondary | ICD-10-CM | POA: Diagnosis not present

## 2014-03-30 ENCOUNTER — Other Ambulatory Visit: Payer: Self-pay | Admitting: Dermatology

## 2014-03-30 DIAGNOSIS — C8589 Other specified types of non-Hodgkin lymphoma, extranodal and solid organ sites: Secondary | ICD-10-CM | POA: Diagnosis not present

## 2014-03-30 DIAGNOSIS — D485 Neoplasm of uncertain behavior of skin: Secondary | ICD-10-CM | POA: Diagnosis not present

## 2014-04-10 DIAGNOSIS — L819 Disorder of pigmentation, unspecified: Secondary | ICD-10-CM | POA: Diagnosis not present

## 2014-04-10 DIAGNOSIS — D1801 Hemangioma of skin and subcutaneous tissue: Secondary | ICD-10-CM | POA: Diagnosis not present

## 2014-04-10 DIAGNOSIS — C8589 Other specified types of non-Hodgkin lymphoma, extranodal and solid organ sites: Secondary | ICD-10-CM | POA: Diagnosis not present

## 2014-04-10 DIAGNOSIS — L821 Other seborrheic keratosis: Secondary | ICD-10-CM | POA: Diagnosis not present

## 2014-04-12 ENCOUNTER — Telehealth: Payer: Self-pay | Admitting: Oncology

## 2014-04-12 NOTE — Telephone Encounter (Signed)
CALLED REFERRING OFFICE IN REF TO NP APPT. JESSICA WILL CALL PT IN REF TO APPT. [place]

## 2014-04-13 ENCOUNTER — Telehealth: Payer: Self-pay | Admitting: Oncology

## 2014-04-13 NOTE — Telephone Encounter (Signed)
C/D 04/13/14 for appt. 04/30/14

## 2014-04-18 DIAGNOSIS — H4920 Sixth [abducent] nerve palsy, unspecified eye: Secondary | ICD-10-CM | POA: Diagnosis not present

## 2014-04-18 DIAGNOSIS — H532 Diplopia: Secondary | ICD-10-CM | POA: Diagnosis not present

## 2014-04-18 DIAGNOSIS — H25019 Cortical age-related cataract, unspecified eye: Secondary | ICD-10-CM | POA: Diagnosis not present

## 2014-04-30 ENCOUNTER — Ambulatory Visit: Payer: Medicare Other

## 2014-04-30 ENCOUNTER — Other Ambulatory Visit (HOSPITAL_BASED_OUTPATIENT_CLINIC_OR_DEPARTMENT_OTHER): Payer: Medicare Other

## 2014-04-30 ENCOUNTER — Encounter (INDEPENDENT_AMBULATORY_CARE_PROVIDER_SITE_OTHER): Payer: Self-pay

## 2014-04-30 ENCOUNTER — Encounter: Payer: Self-pay | Admitting: Oncology

## 2014-04-30 ENCOUNTER — Telehealth: Payer: Self-pay | Admitting: Oncology

## 2014-04-30 ENCOUNTER — Ambulatory Visit (HOSPITAL_BASED_OUTPATIENT_CLINIC_OR_DEPARTMENT_OTHER): Payer: Medicare Other | Admitting: Oncology

## 2014-04-30 VITALS — BP 142/62 | HR 48 | Temp 97.8°F | Resp 18 | Ht 71.0 in | Wt 236.7 lb

## 2014-04-30 DIAGNOSIS — C884 Extranodal marginal zone B-cell lymphoma of mucosa-associated lymphoid tissue [MALT-lymphoma]: Secondary | ICD-10-CM | POA: Insufficient documentation

## 2014-04-30 DIAGNOSIS — C8510 Unspecified B-cell lymphoma, unspecified site: Secondary | ICD-10-CM | POA: Diagnosis not present

## 2014-04-30 DIAGNOSIS — C8380 Other non-follicular lymphoma, unspecified site: Secondary | ICD-10-CM | POA: Diagnosis not present

## 2014-04-30 LAB — CBC WITH DIFFERENTIAL/PLATELET
BASO%: 1 % (ref 0.0–2.0)
BASOS ABS: 0.1 10*3/uL (ref 0.0–0.1)
EOS ABS: 0.2 10*3/uL (ref 0.0–0.5)
EOS%: 2.4 % (ref 0.0–7.0)
HCT: 43.3 % (ref 38.4–49.9)
HGB: 14.4 g/dL (ref 13.0–17.1)
LYMPH#: 2.7 10*3/uL (ref 0.9–3.3)
LYMPH%: 33 % (ref 14.0–49.0)
MCH: 30.5 pg (ref 27.2–33.4)
MCHC: 33.2 g/dL (ref 32.0–36.0)
MCV: 91.8 fL (ref 79.3–98.0)
MONO#: 0.7 10*3/uL (ref 0.1–0.9)
MONO%: 8.8 % (ref 0.0–14.0)
NEUT%: 54.8 % (ref 39.0–75.0)
NEUTROS ABS: 4.4 10*3/uL (ref 1.5–6.5)
Platelets: 160 10*3/uL (ref 140–400)
RBC: 4.71 10*6/uL (ref 4.20–5.82)
RDW: 14.7 % — AB (ref 11.0–14.6)
WBC: 8.1 10*3/uL (ref 4.0–10.3)

## 2014-04-30 LAB — COMPREHENSIVE METABOLIC PANEL (CC13)
ALBUMIN: 4.2 g/dL (ref 3.5–5.0)
ALT: 22 U/L (ref 0–55)
AST: 18 U/L (ref 5–34)
Alkaline Phosphatase: 58 U/L (ref 40–150)
Anion Gap: 9 mEq/L (ref 3–11)
BUN: 14.9 mg/dL (ref 7.0–26.0)
CALCIUM: 10.3 mg/dL (ref 8.4–10.4)
CHLORIDE: 106 meq/L (ref 98–109)
CO2: 26 mEq/L (ref 22–29)
Creatinine: 1.3 mg/dL (ref 0.7–1.3)
Glucose: 113 mg/dl (ref 70–140)
POTASSIUM: 4.2 meq/L (ref 3.5–5.1)
Sodium: 141 mEq/L (ref 136–145)
Total Bilirubin: 0.35 mg/dL (ref 0.20–1.20)
Total Protein: 8.3 g/dL (ref 6.4–8.3)

## 2014-04-30 LAB — LACTATE DEHYDROGENASE (CC13): LDH: 180 U/L (ref 125–245)

## 2014-04-30 NOTE — Progress Notes (Signed)
[facility] New Patient Consult   Referring MD: Burnell Hurta 73 y.o.  06-20-1941    Reason for Referral: Non-Hodgkin's lymphoma   HPI: Mr. [name] reports the presence of a cutaneous nodule in the right retroperitoneal or area for approximately 2 years. He saw Dr. Elvera Lennox. A "fleshy "nodule was located in the right retroauricular region. A shave biopsy was performed. The pathology [id number]) confirmed an atypical lymphoproliferative infiltrate consistent with a marginal zone lymphoma. The infiltrate involving the dermis with aggregates of small lymphocytes and expansile areas of plasmacytoid cells. The immunohistochemical profile and histology were felt to be consistent with a marginal zone lymphoma.  He reports feeling completely well. He had a similar lesion excised from the right retroauricular area approximately 9 years ago and reports being told this was a "fatty "lesion.   Past medical history: 1. Hypertension 2. Diabetes 3. Hyperlipidemia 4. spontaneous pneumothorax at age 34 5. prostate cancer in 2009  Past surgical history: 1. prostatectomy in 2009 2. lumbar laminectomy   Medications: Reviewed   Family history: A paternal uncle died of prostate cancer in his 66s. No other family history of cancer.  Social History:   He lives with his wife in [place]. He is retired from [employer]. He worked on automobiles as a hobby. He quit smoking cigarettes 25 years ago. No alcohol use. No transfusion history. No risk factor for HIV or hepatitis.   ROS:   Positives include: None  A complete ROS was otherwise negative.  Physical Exam:  Blood pressure 142/62, pulse 48, temperature 97.8 F (36.6 C), temperature source Oral, resp. rate 18, height 5\' 11"  (1.803 m), weight 236 lb 11.2 oz (107.366 kg), SpO2 100.00%.  HEENT: Oropharynx without visible mass, neck without mass Lungs: Bronchial sounds at the upper  posterior chest bilaterally, no respiratory distress Cardiac: Regular rate and rhythm Abdomen: No hepatosplenomegaly, nontender, no mass GU: Testes without mass, right testicle is slightly larger than the left side  Vascular: No leg edema Lymph nodes: No cervical, supra-clavicular, axillary, or inguinal nodes Neurologic: Alert and oriented, the motor exam appears intact in the upper and lower extremities Skin: Multiple benign appearing moles over the trunk and extremities. Right retroauricular scar without evidence of recurrent tumor Musculoskeletal: No spine tenderness   LAB:  CBC  Lab Results  Component Value Date   WBC 8.1 04/30/2014   HGB 14.4 04/30/2014   HCT 43.3 04/30/2014   MCV 91.8 04/30/2014   PLT 160 04/30/2014   NEUTROABS 4.4 04/30/2014        Assessment/Plan:   1. 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 Prostate cancer diagnosed in 2009, status post prostatectomy  3.   Diabetes  4.   Hypertension  5.   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 The mass has been grossly excised. There is no clinical evidence of additional disease. He has no symptoms related to lymphoma.  I discussed the diagnosis and treatment options with [name] and is wife. He will undergo laboratory studies to look for evidence of systemic disease including a chemistry panel, LDH, serum protein electrophoresis, and beta-2 microglobulin level. We discussed the indication for staging CT scans. He is asymptomatic and the presence of a low-grade lymphoma involving other sites would likely not change our current management. He is comfortable with observation and no  CT scans for now.  [name] understands there is a chance of developing recurrent marginal zone lymphoma in the right retroauricular region or other sites.  I discussed the case with radiation oncology and involved field radiation is not recommended at present given the gross excision.  We decided to follow him with observation. He will return for an office visit in 6 months. He will contact us sooner if he develops regrowth of the retroauricular lesion or new lesions.  [place], [place] 04/30/2014, 5:25 PM

## 2014-04-30 NOTE — Telephone Encounter (Signed)
Pt confirmed labs/ov per 10/12 POF, gave pt AVS.... KJ °

## 2014-04-30 NOTE — Progress Notes (Signed)
Checked in new patient with no financial issues prior to seeing the dr. He has appt card and has not been out of the country. He has primar and second insurance.

## 2014-05-02 LAB — BETA 2 MICROGLOBULIN, SERUM: Beta-2 Microglobulin: 2.16 mg/L (ref <=2.51)

## 2014-05-03 LAB — PROTEIN ELECTROPHORESIS, SERUM, WITH REFLEX
ALPHA-2-GLOBULIN: 9.1 % (ref 7.1–11.8)
Albumin ELP: 55.9 % (ref 55.8–66.1)
Alpha-1-Globulin: 3.9 % (ref 2.9–4.9)
BETA 2: 7.9 % — AB (ref 3.2–6.5)
BETA GLOBULIN: 6.6 % (ref 4.7–7.2)
GAMMA GLOBULIN: 16.6 % (ref 11.1–18.8)
Total Protein, Serum Electrophoresis: 7.9 g/dL (ref 6.0–8.3)

## 2014-05-04 ENCOUNTER — Other Ambulatory Visit: Payer: Self-pay

## 2014-05-23 DIAGNOSIS — R202 Paresthesia of skin: Secondary | ICD-10-CM | POA: Diagnosis not present

## 2014-05-23 DIAGNOSIS — C8590 Non-Hodgkin lymphoma, unspecified, unspecified site: Secondary | ICD-10-CM | POA: Diagnosis not present

## 2014-05-23 DIAGNOSIS — I10 Essential (primary) hypertension: Secondary | ICD-10-CM | POA: Diagnosis not present

## 2014-05-23 DIAGNOSIS — G4733 Obstructive sleep apnea (adult) (pediatric): Secondary | ICD-10-CM | POA: Diagnosis not present

## 2014-06-08 DIAGNOSIS — Z23 Encounter for immunization: Secondary | ICD-10-CM | POA: Diagnosis not present

## 2014-06-08 DIAGNOSIS — M5412 Radiculopathy, cervical region: Secondary | ICD-10-CM | POA: Diagnosis not present

## 2014-09-19 DIAGNOSIS — C8590 Non-Hodgkin lymphoma, unspecified, unspecified site: Secondary | ICD-10-CM | POA: Diagnosis not present

## 2014-09-19 DIAGNOSIS — E669 Obesity, unspecified: Secondary | ICD-10-CM | POA: Diagnosis not present

## 2014-09-19 DIAGNOSIS — E780 Pure hypercholesterolemia: Secondary | ICD-10-CM | POA: Diagnosis not present

## 2014-09-19 DIAGNOSIS — Z6832 Body mass index (BMI) 32.0-32.9, adult: Secondary | ICD-10-CM | POA: Diagnosis not present

## 2014-09-19 DIAGNOSIS — K219 Gastro-esophageal reflux disease without esophagitis: Secondary | ICD-10-CM | POA: Diagnosis not present

## 2014-09-19 DIAGNOSIS — I10 Essential (primary) hypertension: Secondary | ICD-10-CM | POA: Diagnosis not present

## 2014-09-19 DIAGNOSIS — Z79899 Other long term (current) drug therapy: Secondary | ICD-10-CM | POA: Diagnosis not present

## 2014-10-19 DIAGNOSIS — H4011X1 Primary open-angle glaucoma, mild stage: Secondary | ICD-10-CM | POA: Diagnosis not present

## 2014-10-30 ENCOUNTER — Telehealth: Payer: Self-pay | Admitting: Oncology

## 2014-10-30 ENCOUNTER — Ambulatory Visit (HOSPITAL_BASED_OUTPATIENT_CLINIC_OR_DEPARTMENT_OTHER): Payer: Medicare Other | Admitting: Oncology

## 2014-10-30 VITALS — BP 137/57 | HR 56 | Temp 98.4°F | Resp 18 | Ht 71.0 in | Wt 241.5 lb

## 2014-10-30 DIAGNOSIS — C884 Extranodal marginal zone B-cell lymphoma of mucosa-associated lymphoid tissue [MALT-lymphoma]: Secondary | ICD-10-CM

## 2014-10-30 DIAGNOSIS — C8589 Other specified types of non-Hodgkin lymphoma, extranodal and solid organ sites: Secondary | ICD-10-CM | POA: Diagnosis not present

## 2014-10-30 NOTE — Telephone Encounter (Signed)
Pt lft msg to schedule for next visit, sent pt msg through my chart with updated schedule also called pt to confirm.... KJ

## 2014-10-30 NOTE — Progress Notes (Signed)
  [facility] OFFICE PROGRESS NOTE   Diagnosis: Non-Hodgkin's lymphoma  INTERVAL HISTORY:   [name] returns as scheduled. He feels well. No palpable skin nodules. Good appetite and energy level. His only complaint is stiffness in the joints.  Objective:  Vital signs in last 24 hours:  Blood pressure 137/57, pulse 56, temperature 98.4 F (36.9 C), temperature source Oral, resp. rate 18, height 5\' 11"  (1.803 m), weight 241 lb 8 oz (109.544 kg), SpO2 100 %.    HEENT: Oropharynx without visible mass, neck without mass Lymphatics: No cervical, supraclavicular, axillary, or inguinal nodes Resp: Lungs clear bilaterally Cardio: Regular rate and rhythm GI: No hepatosplenomegaly Vascular: No leg edema  Skin: Right retroauricular scar is without evidence of recurrent tumor    Lab Results:  Lab Results  Component Value Date   WBC 8.1 04/30/2014   HGB 14.4 04/30/2014   HCT 43.3 04/30/2014   MCV 91.8 04/30/2014   PLT 160 04/30/2014   NEUTROABS 4.4 04/30/2014    04/30/2014: LDH 180, beta-2 microglobulin 2.16, serum M spike not detected  Medications: I have reviewed the patient's current medications.  Assessment/Plan: 1. 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 Prostate cancer diagnosed in 2009, status post prostatectomy  3. Diabetes  4. Hypertension  5. hyperlipidemia    Disposition:  [name] remains in clinical remission from the marginal zone lymphoma. He will return for an office visit in 8 months.  Betsy Coder, MD  10/30/2014  9:50 AM

## 2014-10-30 NOTE — Telephone Encounter (Signed)
Patient confirmed appointment for Dec. Did not want calendar mailed.

## 2014-11-05 DIAGNOSIS — C61 Malignant neoplasm of prostate: Secondary | ICD-10-CM | POA: Diagnosis not present

## 2014-11-13 DIAGNOSIS — Z8546 Personal history of malignant neoplasm of prostate: Secondary | ICD-10-CM | POA: Diagnosis not present

## 2014-11-13 DIAGNOSIS — N5201 Erectile dysfunction due to arterial insufficiency: Secondary | ICD-10-CM | POA: Diagnosis not present

## 2014-11-26 DIAGNOSIS — L309 Dermatitis, unspecified: Secondary | ICD-10-CM | POA: Diagnosis not present

## 2014-11-26 DIAGNOSIS — D225 Melanocytic nevi of trunk: Secondary | ICD-10-CM | POA: Diagnosis not present

## 2014-11-26 DIAGNOSIS — L720 Epidermal cyst: Secondary | ICD-10-CM | POA: Diagnosis not present

## 2014-11-26 DIAGNOSIS — D224 Melanocytic nevi of scalp and neck: Secondary | ICD-10-CM | POA: Diagnosis not present

## 2014-12-14 DIAGNOSIS — M25461 Effusion, right knee: Secondary | ICD-10-CM | POA: Diagnosis not present

## 2014-12-14 DIAGNOSIS — I10 Essential (primary) hypertension: Secondary | ICD-10-CM | POA: Diagnosis not present

## 2015-01-08 DIAGNOSIS — M1711 Unilateral primary osteoarthritis, right knee: Secondary | ICD-10-CM | POA: Diagnosis not present

## 2015-01-14 ENCOUNTER — Other Ambulatory Visit: Payer: Self-pay

## 2015-02-08 DIAGNOSIS — M1711 Unilateral primary osteoarthritis, right knee: Secondary | ICD-10-CM | POA: Diagnosis not present

## 2015-02-11 DIAGNOSIS — I10 Essential (primary) hypertension: Secondary | ICD-10-CM | POA: Diagnosis not present

## 2015-02-11 DIAGNOSIS — E119 Type 2 diabetes mellitus without complications: Secondary | ICD-10-CM | POA: Diagnosis not present

## 2015-02-11 DIAGNOSIS — J309 Allergic rhinitis, unspecified: Secondary | ICD-10-CM | POA: Diagnosis not present

## 2015-02-11 DIAGNOSIS — M5412 Radiculopathy, cervical region: Secondary | ICD-10-CM | POA: Diagnosis not present

## 2015-02-12 DIAGNOSIS — H40053 Ocular hypertension, bilateral: Secondary | ICD-10-CM | POA: Diagnosis not present

## 2015-02-21 DIAGNOSIS — M23351 Other meniscus derangements, posterior horn of lateral meniscus, right knee: Secondary | ICD-10-CM | POA: Diagnosis not present

## 2015-02-21 DIAGNOSIS — M1711 Unilateral primary osteoarthritis, right knee: Secondary | ICD-10-CM | POA: Diagnosis not present

## 2015-02-25 DIAGNOSIS — M5412 Radiculopathy, cervical region: Secondary | ICD-10-CM | POA: Diagnosis not present

## 2015-02-26 ENCOUNTER — Other Ambulatory Visit: Payer: Self-pay | Admitting: Physical Medicine and Rehabilitation

## 2015-02-26 DIAGNOSIS — M5412 Radiculopathy, cervical region: Secondary | ICD-10-CM

## 2015-02-28 ENCOUNTER — Ambulatory Visit
Admission: RE | Admit: 2015-02-28 | Discharge: 2015-02-28 | Disposition: A | Discharge status: Home / Self Care | Payer: Medicare Other | Source: Ambulatory Visit | Attending: Physical Medicine and Rehabilitation | Admitting: Physical Medicine and Rehabilitation

## 2015-02-28 DIAGNOSIS — M5021 Other cervical disc displacement,  high cervical region: Secondary | ICD-10-CM | POA: Diagnosis not present

## 2015-02-28 DIAGNOSIS — M5412 Radiculopathy, cervical region: Secondary | ICD-10-CM

## 2015-02-28 DIAGNOSIS — M9971 Connective tissue and disc stenosis of intervertebral foramina of cervical region: Secondary | ICD-10-CM | POA: Diagnosis not present

## 2015-02-28 DIAGNOSIS — M47813 Spondylosis without myelopathy or radiculopathy, cervicothoracic region: Secondary | ICD-10-CM | POA: Diagnosis not present

## 2015-03-12 ENCOUNTER — Ambulatory Visit: Payer: Self-pay | Admitting: Orthopedic Surgery

## 2015-03-12 NOTE — Progress Notes (Signed)
Preoperative surgical orders have been place into the Epic hospital system for [name] on 03/12/2015, 12:21 PM  by Mickel Crow for surgery on 03/27/2015.  Preop Knee Scope orders including IV Tylenol and IV Decadron as long as there are no contraindications to the above medications. Arlee Muslim, PA-C

## 2015-03-19 ENCOUNTER — Other Ambulatory Visit (HOSPITAL_COMMUNITY): Payer: Self-pay | Admitting: *Deleted

## 2015-03-19 NOTE — Patient Instructions (Addendum)
[name]  03/19/2015   Your procedure is scheduled on: Wednesday September 7th, 2016  Report to [facility] Main  Entrance take [place]  elevators to 3rd floor to  [place] at 830 AM.  Call this number if you have problems the morning of surgery [phone]   Remember: ONLY 1 PERSON MAY GO WITH YOU TO SHORT STAY TO GET  READY MORNING OF [place].  Do not eat food or drink liquids :After Midnight.  BRING CPAP MASK AND TUBING   Take these medicines the morning of surgery with A SIP OF WATER:  Amlodipine (norvasc), Atenolol (tenormin)                               You may not have any metal on your body including hair pins and              piercings  Do not wear jewelry, make-up, lotions, powders or perfumes, deodorant             Do not wear nail polish.  Do not shave  48 hours prior to surgery.              Men may shave face and neck.   Do not bring valuables to the hospital. [place].  Contacts, dentures or bridgework may not be worn into surgery.  Leave suitcase in the car. After surgery it may be brought to your room.     Patients discharged the day of surgery will not be allowed to drive home.  Name and phone number of your driver: [name] CELL [phone]  Special Instructions: N/A              Please read over the following fact sheets you were given: _____________________________________________________________________             [facility] - Preparing for Surgery Before surgery, you can play an important role.  Because skin is not sterile, your skin needs to be as free of germs as possible.  You can reduce the number of germs on your skin by washing with CHG (chlorahexidine gluconate) soap before surgery.  CHG is an antiseptic cleaner which kills germs and bonds with the skin to continue killing germs even after washing. Please DO NOT use if you have an allergy to CHG or  antibacterial soaps.  If your skin becomes reddened/irritated stop using the CHG and inform your nurse when you arrive at Short Stay. Do not shave (including legs and underarms) for at least 48 hours prior to the first CHG shower.  You may shave your face/neck. Please follow these instructions carefully:  1.  Shower with CHG Soap the night before surgery and the  morning of Surgery.  2.  If you choose to wash your hair, wash your hair first as usual with your  normal  shampoo.  3.  After you shampoo, rinse your hair and body thoroughly to remove the  shampoo.                           4.  Use CHG as you would any other liquid soap.  You can apply chg directly  to the skin and  wash                       Gently with a scrungie or clean washcloth.  5.  Apply the CHG Soap to your body ONLY FROM THE NECK DOWN.   Do not use on face/ open                           Wound or open sores. Avoid contact with eyes, ears mouth and genitals (private parts).                       Wash face,  Genitals (private parts) with your normal soap.             6.  Wash thoroughly, paying special attention to the area where your surgery  will be performed.  7.  Thoroughly rinse your body with warm water from the neck down.  8.  DO NOT shower/wash with your normal soap after using and rinsing off  the CHG Soap.                9.  Pat yourself dry with a clean towel.            10.  Wear clean pajamas.            11.  Place clean sheets on your bed the night of your first shower and do not  sleep with pets. Day of Surgery : Do not apply any lotions/deodorants the morning of surgery.  Please wear clean clothes to the hospital/surgery center.  FAILURE TO FOLLOW THESE INSTRUCTIONS MAY RESULT IN THE CANCELLATION OF YOUR SURGERY PATIENT SIGNATURE_________________________________  NURSE SIGNATURE__________________________________  ________________________________________________________________________   [name]  An incentive spirometer is a tool that can help keep your lungs clear and active. This tool measures how well you are filling your lungs with each breath. Taking long deep breaths may help reverse or decrease the chance of developing breathing (pulmonary) problems (especially infection) following:  A long period of time when you are unable to move or be active. BEFORE THE PROCEDURE   If the spirometer includes an indicator to show your best effort, your nurse or respiratory therapist will set it to a desired goal.  If possible, sit up straight or lean slightly forward. Try not to slouch.  Hold the incentive spirometer in an upright position. INSTRUCTIONS FOR USE  1. Sit on the edge of your bed if possible, or sit up as far as you can in bed or on a chair. 2. Hold the incentive spirometer in an upright position. 3. Breathe out normally. 4. Place the mouthpiece in your mouth and seal your lips tightly around it. 5. Breathe in slowly and as deeply as possible, raising the piston or the ball toward the top of the column. 6. Hold your breath for 3-5 seconds or for as long as possible. Allow the piston or ball to fall to the bottom of the column. 7. Remove the mouthpiece from your mouth and breathe out normally. 8. Rest for a few seconds and repeat Steps 1 through 7 at least 10 times every 1-2 hours when you are awake. Take your time and take a few normal breaths between deep breaths. 9. The spirometer may include an indicator to show your best effort. Use the indicator as a goal to work toward during each repetition. 10. After each set of 10 deep  breaths, practice coughing to be sure your lungs are clear. If you have an incision (the cut made at the time of surgery), support your incision when coughing by placing a pillow or rolled up towels firmly against it. Once you are able to get out of bed, walk around indoors and cough well. You may stop using the incentive spirometer when  instructed by your caregiver.  RISKS AND COMPLICATIONS  Take your time so you do not get dizzy or light-headed.  If you are in pain, you may need to take or ask for pain medication before doing incentive spirometry. It is harder to take a deep breath if you are having pain. AFTER USE  Rest and breathe slowly and easily.  It can be helpful to keep track of a log of your progress. Your caregiver can provide you with a simple table to help with this. If you are using the spirometer at home, follow these instructions: [place] IF:   You are having difficultly using the spirometer.  You have trouble using the spirometer as often as instructed.  Your pain medication is not giving enough relief while using the spirometer.  You develop fever of 100.5 F (38.1 C) or higher. SEEK IMMEDIATE MEDICAL CARE IF:   You cough up bloody sputum that had not been present before.  You develop fever of 102 F (38.9 C) or greater.  You develop worsening pain at or near the incision site. MAKE SURE YOU:   Understand these instructions.  Will watch your condition.  Will get help right away if you are not doing well or get worse. Document Released: 11/16/2006 Document Revised: 09/28/2011 Document Reviewed: 01/17/2007 [facility] Patient Information 2014 [place], [place].   ________________________________________________________________________

## 2015-03-20 ENCOUNTER — Encounter (HOSPITAL_COMMUNITY): Payer: Self-pay

## 2015-03-20 ENCOUNTER — Encounter (HOSPITAL_COMMUNITY)
Admission: RE | Admit: 2015-03-20 | Discharge: 2015-03-20 | Disposition: A | Discharge status: Home / Self Care | Payer: Medicare Other | Source: Ambulatory Visit | Attending: Orthopedic Surgery | Admitting: Orthopedic Surgery

## 2015-03-20 DIAGNOSIS — S83289D Other tear of lateral meniscus, current injury, unspecified knee, subsequent encounter: Secondary | ICD-10-CM | POA: Insufficient documentation

## 2015-03-20 DIAGNOSIS — Z01818 Encounter for other preprocedural examination: Secondary | ICD-10-CM | POA: Insufficient documentation

## 2015-03-20 HISTORY — DX: Gastro-esophageal reflux disease without esophagitis: K21.9

## 2015-03-20 HISTORY — DX: Malignant (primary) neoplasm, unspecified: C80.1

## 2015-03-20 HISTORY — DX: Type 2 diabetes mellitus without complications: E11.9

## 2015-03-20 HISTORY — DX: Unspecified osteoarthritis, unspecified site: M19.90

## 2015-03-20 HISTORY — DX: Unspecified tear of unspecified meniscus, current injury, right knee, initial encounter: S83.206A

## 2015-03-20 HISTORY — DX: Essential (primary) hypertension: I10

## 2015-03-20 HISTORY — DX: Sleep apnea, unspecified: G47.30

## 2015-03-20 LAB — CBC
HCT: 39.8 % (ref 39.0–52.0)
HEMOGLOBIN: 13.7 g/dL (ref 13.0–17.0)
MCH: 30.9 pg (ref 26.0–34.0)
MCHC: 34.4 g/dL (ref 30.0–36.0)
MCV: 89.8 fL (ref 78.0–100.0)
Platelets: 163 10*3/uL (ref 150–400)
RBC: 4.43 MIL/uL (ref 4.22–5.81)
RDW: 14.8 % (ref 11.5–15.5)
WBC: 7.5 10*3/uL (ref 4.0–10.5)

## 2015-03-20 LAB — BASIC METABOLIC PANEL
ANION GAP: 8 (ref 5–15)
BUN: 20 mg/dL (ref 6–20)
CALCIUM: 9.8 mg/dL (ref 8.9–10.3)
CHLORIDE: 104 mmol/L (ref 101–111)
CO2: 26 mmol/L (ref 22–32)
Creatinine, Ser: 1.42 mg/dL — ABNORMAL HIGH (ref 0.61–1.24)
GFR calc non Af Amer: 47 mL/min — ABNORMAL LOW (ref >60)
GFR, EST AFRICAN AMERICAN: 54 mL/min — AB (ref >60)
GLUCOSE: 193 mg/dL — AB (ref 65–99)
Potassium: 3.9 mmol/L (ref 3.5–5.1)
Sodium: 138 mmol/L (ref 135–145)

## 2015-03-20 LAB — PROTIME-INR
INR: 1.01 (ref 0.00–1.49)
PROTHROMBIN TIME: 13.5 s (ref 11.6–15.2)

## 2015-03-20 LAB — APTT: aPTT: 31 seconds (ref 24–37)

## 2015-03-20 NOTE — Progress Notes (Signed)
bmet results faxed to dr aluisio by epic 

## 2015-03-21 NOTE — Progress Notes (Signed)
Fax received and placed on chart, no action on cmet results faxed per dr Wynelle Link

## 2015-03-22 DIAGNOSIS — M5412 Radiculopathy, cervical region: Secondary | ICD-10-CM | POA: Diagnosis not present

## 2015-03-22 DIAGNOSIS — Z6833 Body mass index (BMI) 33.0-33.9, adult: Secondary | ICD-10-CM | POA: Diagnosis not present

## 2015-03-26 NOTE — Anesthesia Preprocedure Evaluation (Addendum)
Anesthesia Evaluation  Patient identified by MRN, date of birth, ID band Patient awake    Reviewed: Allergy & Precautions, NPO status , Patient's Chart, lab work & pertinent test results  History of Anesthesia Complications Negative for: history of anesthetic complications  Airway Mallampati: III  TM Distance: >3 FB Neck ROM: Full    Dental no notable dental hx. (+) Dental Advisory Given   Pulmonary sleep apnea and Continuous Positive Airway Pressure Ventilation , former smoker,  breath sounds clear to auscultation  Pulmonary exam normal       Cardiovascular hypertension, Pt. on medications and Pt. on home beta blockers Normal cardiovascular examRhythm:Regular Rate:Normal     Neuro/Psych negative neurological ROS  negative psych ROS   GI/Hepatic Neg liver ROS, GERD-  Medicated and Controlled,  Endo/Other  negative endocrine ROSdiabetes  Renal/GU negative Renal ROS  negative genitourinary   Musculoskeletal  (+) Arthritis -,   Abdominal   Peds negative pediatric ROS (+)  Hematology negative hematology ROS (+)   Anesthesia Other Findings   Reproductive/Obstetrics negative OB ROS                            Anesthesia Physical Anesthesia Plan  ASA: II  Anesthesia Plan: General   Post-op Pain Management:    Induction: Intravenous  Airway Management Planned: LMA  Additional Equipment:   Intra-op Plan:   Post-operative Plan: Extubation in OR  Informed Consent: I have reviewed the patients History and Physical, chart, labs and discussed the procedure including the risks, benefits and alternatives for the proposed anesthesia with the patient or authorized representative who has indicated his/her understanding and acceptance.   Dental advisory given  Plan Discussed with:   Anesthesia Plan Comments:        Anesthesia Quick Evaluation

## 2015-03-27 ENCOUNTER — Encounter (HOSPITAL_COMMUNITY): Payer: Self-pay | Admitting: *Deleted

## 2015-03-27 ENCOUNTER — Ambulatory Visit (HOSPITAL_COMMUNITY): Payer: Medicare Other | Admitting: Anesthesiology

## 2015-03-27 ENCOUNTER — Encounter (HOSPITAL_COMMUNITY)
Admission: RE | Disposition: A | Discharge status: Home / Self Care | Payer: Self-pay | Source: Ambulatory Visit | Attending: Orthopedic Surgery

## 2015-03-27 ENCOUNTER — Ambulatory Visit (HOSPITAL_COMMUNITY)
Admission: RE | Admit: 2015-03-27 | Discharge: 2015-03-27 | Disposition: A | Discharge status: Home / Self Care | Payer: Medicare Other | Source: Ambulatory Visit | Attending: Orthopedic Surgery | Admitting: Orthopedic Surgery

## 2015-03-27 DIAGNOSIS — Z7982 Long term (current) use of aspirin: Secondary | ICD-10-CM | POA: Diagnosis not present

## 2015-03-27 DIAGNOSIS — Z8572 Personal history of non-Hodgkin lymphomas: Secondary | ICD-10-CM | POA: Diagnosis not present

## 2015-03-27 DIAGNOSIS — G4730 Sleep apnea, unspecified: Secondary | ICD-10-CM | POA: Diagnosis not present

## 2015-03-27 DIAGNOSIS — K219 Gastro-esophageal reflux disease without esophagitis: Secondary | ICD-10-CM | POA: Diagnosis not present

## 2015-03-27 DIAGNOSIS — I10 Essential (primary) hypertension: Secondary | ICD-10-CM | POA: Diagnosis not present

## 2015-03-27 DIAGNOSIS — Z8546 Personal history of malignant neoplasm of prostate: Secondary | ICD-10-CM | POA: Diagnosis not present

## 2015-03-27 DIAGNOSIS — S83289A Other tear of lateral meniscus, current injury, unspecified knee, initial encounter: Secondary | ICD-10-CM | POA: Diagnosis present

## 2015-03-27 DIAGNOSIS — E119 Type 2 diabetes mellitus without complications: Secondary | ICD-10-CM | POA: Diagnosis not present

## 2015-03-27 DIAGNOSIS — M1990 Unspecified osteoarthritis, unspecified site: Secondary | ICD-10-CM | POA: Insufficient documentation

## 2015-03-27 DIAGNOSIS — M23261 Derangement of other lateral meniscus due to old tear or injury, right knee: Secondary | ICD-10-CM | POA: Insufficient documentation

## 2015-03-27 DIAGNOSIS — M94261 Chondromalacia, right knee: Secondary | ICD-10-CM | POA: Insufficient documentation

## 2015-03-27 DIAGNOSIS — S83281A Other tear of lateral meniscus, current injury, right knee, initial encounter: Secondary | ICD-10-CM | POA: Diagnosis not present

## 2015-03-27 DIAGNOSIS — M23351 Other meniscus derangements, posterior horn of lateral meniscus, right knee: Secondary | ICD-10-CM | POA: Diagnosis not present

## 2015-03-27 HISTORY — PX: KNEE ARTHROSCOPY: SHX127

## 2015-03-27 LAB — GLUCOSE, CAPILLARY
Glucose-Capillary: 165 mg/dL — ABNORMAL HIGH (ref 65–99)
Glucose-Capillary: 170 mg/dL — ABNORMAL HIGH (ref 65–99)

## 2015-03-27 SURGERY — ARTHROSCOPY, KNEE
Anesthesia: General | Site: Knee | Laterality: Right

## 2015-03-27 MED ORDER — CEFAZOLIN SODIUM-DEXTROSE 2-3 GM-% IV SOLR
2.0000 g | INTRAVENOUS | Status: AC
  Administered 2015-03-27: 2 g via INTRAVENOUS

## 2015-03-27 MED ORDER — MIDAZOLAM HCL 2 MG/2ML IJ SOLN
INTRAMUSCULAR | Status: AC
  Filled 2015-03-27: qty 4

## 2015-03-27 MED ORDER — PROPOFOL 10 MG/ML IV BOLUS
INTRAVENOUS | Status: AC
  Filled 2015-03-27: qty 20

## 2015-03-27 MED ORDER — HYDROMORPHONE HCL 1 MG/ML IJ SOLN
INTRAMUSCULAR | Status: AC
  Administered 2015-03-27: 0.5 mg via INTRAVENOUS
  Filled 2015-03-27: qty 1

## 2015-03-27 MED ORDER — OXYCODONE HCL 5 MG PO TABS
ORAL_TABLET | ORAL | Status: AC
  Administered 2015-03-27: 5 mg
  Filled 2015-03-27: qty 1

## 2015-03-27 MED ORDER — FENTANYL CITRATE (PF) 100 MCG/2ML IJ SOLN
INTRAMUSCULAR | Status: AC
  Filled 2015-03-27: qty 4

## 2015-03-27 MED ORDER — HYDROCODONE-ACETAMINOPHEN 5-325 MG PO TABS
1.0000 | ORAL_TABLET | ORAL | Status: DC | PRN
  Administered 2015-03-27: 1 via ORAL
  Filled 2015-03-27: qty 1

## 2015-03-27 MED ORDER — LACTATED RINGERS IV SOLN
INTRAVENOUS | Status: DC
  Administered 2015-03-27: 1000 mL via INTRAVENOUS
  Administered 2015-03-27: 12:00:00 via INTRAVENOUS

## 2015-03-27 MED ORDER — LIDOCAINE HCL (CARDIAC) 20 MG/ML IV SOLN
INTRAVENOUS | Status: AC
  Filled 2015-03-27: qty 5

## 2015-03-27 MED ORDER — BUPIVACAINE-EPINEPHRINE (PF) 0.25% -1:200000 IJ SOLN
INTRAMUSCULAR | Status: AC
  Filled 2015-03-27: qty 30

## 2015-03-27 MED ORDER — HYDROCODONE-ACETAMINOPHEN 5-325 MG PO TABS: 1.0000 | ORAL_TABLET | Freq: Four times a day (QID) | ORAL | Status: DC | PRN

## 2015-03-27 MED ORDER — MIDAZOLAM HCL 5 MG/5ML IJ SOLN
INTRAMUSCULAR | Status: DC | PRN
  Administered 2015-03-27 (×2): 1 mg via INTRAVENOUS

## 2015-03-27 MED ORDER — DEXAMETHASONE SODIUM PHOSPHATE 10 MG/ML IJ SOLN
INTRAMUSCULAR | Status: DC | PRN
  Administered 2015-03-27: 10 mg via INTRAVENOUS

## 2015-03-27 MED ORDER — ACETAMINOPHEN 10 MG/ML IV SOLN
1000.0000 mg | Freq: Once | INTRAVENOUS | Status: AC
  Administered 2015-03-27: 1000 mg via INTRAVENOUS
  Filled 2015-03-27: qty 100

## 2015-03-27 MED ORDER — ONDANSETRON HCL 4 MG/2ML IJ SOLN
INTRAMUSCULAR | Status: AC
  Filled 2015-03-27: qty 2

## 2015-03-27 MED ORDER — HYDROMORPHONE HCL 1 MG/ML IJ SOLN
0.2500 mg | INTRAMUSCULAR | Status: DC | PRN
  Administered 2015-03-27 (×4): 0.5 mg via INTRAVENOUS

## 2015-03-27 MED ORDER — SODIUM CHLORIDE 0.9 % IV SOLN: INTRAVENOUS | Status: DC

## 2015-03-27 MED ORDER — ONDANSETRON HCL 4 MG/2ML IJ SOLN
4.0000 mg | Freq: Once | INTRAMUSCULAR | Status: DC | PRN
Start: 2015-03-27 — End: 2015-03-27

## 2015-03-27 MED ORDER — DEXAMETHASONE SODIUM PHOSPHATE 10 MG/ML IJ SOLN
INTRAMUSCULAR | Status: AC
  Filled 2015-03-27: qty 1

## 2015-03-27 MED ORDER — ACETAMINOPHEN 10 MG/ML IV SOLN
INTRAVENOUS | Status: AC
  Filled 2015-03-27: qty 100

## 2015-03-27 MED ORDER — FENTANYL CITRATE (PF) 250 MCG/5ML IJ SOLN
INTRAMUSCULAR | Status: AC
  Filled 2015-03-27: qty 25

## 2015-03-27 MED ORDER — BUPIVACAINE-EPINEPHRINE 0.25% -1:200000 IJ SOLN
INTRAMUSCULAR | Status: DC | PRN
  Administered 2015-03-27: 20 mL

## 2015-03-27 MED ORDER — CEFAZOLIN SODIUM-DEXTROSE 2-3 GM-% IV SOLR
INTRAVENOUS | Status: AC
  Filled 2015-03-27: qty 50

## 2015-03-27 MED ORDER — OXYCODONE HCL 5 MG PO TABS: 5.0000 mg | ORAL_TABLET | Freq: Once | ORAL | Status: DC

## 2015-03-27 MED ORDER — LIDOCAINE HCL 1 % IJ SOLN
INTRAMUSCULAR | Status: DC | PRN
  Administered 2015-03-27: 75 mg via INTRADERMAL

## 2015-03-27 MED ORDER — LACTATED RINGERS IR SOLN
Status: DC | PRN
Start: 2015-03-27 — End: 2015-03-27
  Administered 2015-03-27: 9000 mL

## 2015-03-27 MED ORDER — LIDOCAINE HCL (CARDIAC) 20 MG/ML IV SOLN
INTRAVENOUS | Status: AC
Start: 2015-03-27 — End: 2015-03-27
  Filled 2015-03-27: qty 5

## 2015-03-27 MED ORDER — PROPOFOL 10 MG/ML IV BOLUS
INTRAVENOUS | Status: DC | PRN
  Administered 2015-03-27: 180 mg via INTRAVENOUS

## 2015-03-27 MED ORDER — FENTANYL CITRATE (PF) 100 MCG/2ML IJ SOLN
INTRAMUSCULAR | Status: DC | PRN
  Administered 2015-03-27 (×2): 50 ug via INTRAVENOUS

## 2015-03-27 MED ORDER — DEXAMETHASONE SODIUM PHOSPHATE 10 MG/ML IJ SOLN: 10.0000 mg | Freq: Once | INTRAMUSCULAR | Status: DC

## 2015-03-27 MED ORDER — METHOCARBAMOL 500 MG PO TABS: 500.0000 mg | ORAL_TABLET | Freq: Four times a day (QID) | ORAL | Status: DC

## 2015-03-27 MED ORDER — CHLORHEXIDINE GLUCONATE 4 % EX LIQD: 60.0000 mL | Freq: Once | CUTANEOUS | Status: DC

## 2015-03-27 MED ORDER — ONDANSETRON HCL 4 MG/2ML IJ SOLN
INTRAMUSCULAR | Status: DC | PRN
  Administered 2015-03-27: 4 mg via INTRAVENOUS

## 2015-03-27 SURGICAL SUPPLY — 29 items
BANDAGE ELASTIC 6 VELCRO ST LF (GAUZE/BANDAGES/DRESSINGS) ×3 IMPLANT
BLADE 4.2CUDA (BLADE) ×3 IMPLANT
CUFF TOURN SGL QUICK 34 (TOURNIQUET CUFF) ×3
CUFF TRNQT CYL 34X4X40X1 (TOURNIQUET CUFF) ×1 IMPLANT
DRAPE U-SHAPE 47X51 STRL (DRAPES) ×3 IMPLANT
DRSG EMULSION OIL 3X3 NADH (GAUZE/BANDAGES/DRESSINGS) ×3 IMPLANT
DRSG PAD ABDOMINAL 8X10 ST (GAUZE/BANDAGES/DRESSINGS) ×3 IMPLANT
DURAPREP 26ML APPLICATOR (WOUND CARE) ×3 IMPLANT
GAUZE SPONGE 4X4 12PLY STRL (GAUZE/BANDAGES/DRESSINGS) ×3 IMPLANT
GLOVE BIO SURGEON STRL SZ7 (GLOVE) ×2 IMPLANT
GLOVE BIO SURGEON STRL SZ8 (GLOVE) ×3 IMPLANT
GLOVE BIOGEL PI IND STRL 7.5 (GLOVE) IMPLANT
GLOVE BIOGEL PI IND STRL 8 (GLOVE) ×1 IMPLANT
GLOVE BIOGEL PI INDICATOR 7.5 (GLOVE) ×2
GLOVE BIOGEL PI INDICATOR 8 (GLOVE) ×2
GOWN STRL REUS W/TWL LRG LVL3 (GOWN DISPOSABLE) ×5 IMPLANT
KIT BASIN OR (CUSTOM PROCEDURE TRAY) ×3 IMPLANT
MANIFOLD NEPTUNE II (INSTRUMENTS) ×3 IMPLANT
PACK ARTHROSCOPY WL (CUSTOM PROCEDURE TRAY) ×3 IMPLANT
PACK ICE MAXI GEL EZY WRAP (MISCELLANEOUS) ×9 IMPLANT
PADDING CAST COTTON 6X4 STRL (CAST SUPPLIES) ×4 IMPLANT
PEN SKIN MARKING BROAD (MISCELLANEOUS) ×3 IMPLANT
POSITIONER SURGICAL ARM (MISCELLANEOUS) ×3 IMPLANT
SET ARTHROSCOPY TUBING (MISCELLANEOUS) ×3
SET ARTHROSCOPY TUBING LN (MISCELLANEOUS) ×1 IMPLANT
SUT ETHILON 4 0 PS 2 18 (SUTURE) ×3 IMPLANT
TOWEL OR 17X26 10 PK STRL BLUE (TOWEL DISPOSABLE) ×3 IMPLANT
WAND 90 DEG TURBOVAC W/CORD (SURGICAL WAND) ×2 IMPLANT
WRAP KNEE MAXI GEL POST OP (GAUZE/BANDAGES/DRESSINGS) ×3 IMPLANT

## 2015-03-27 NOTE — Anesthesia Procedure Notes (Signed)
Procedure Name: LMA Insertion Date/Time: 03/27/2015 10:11 AM Performed by: Lajuana Carry E Pre-anesthesia Checklist: Patient identified, Emergency Drugs available, Suction available and Patient being monitored Patient Re-evaluated:Patient Re-evaluated prior to inductionOxygen Delivery Method: Circle system utilized Preoxygenation: Pre-oxygenation with 100% oxygen Intubation Type: IV induction LMA: LMA inserted LMA Size: 4.0 Number of attempts: 1 Placement Confirmation: positive ETCO2 and breath sounds checked- equal and bilateral Tube secured with: Tape Dental Injury: Teeth and Oropharynx as per pre-operative assessment

## 2015-03-27 NOTE — H&P (Signed)
  CC- [name] is a 74 y.o. male who presents with right knee pain.  HPI- . Knee Pain: Patient presents with knee pain involving the  right knee. Onset of the symptoms was several months ago. Inciting event: none known. Current symptoms include giving out, pain located laterally and stiffness. Pain is aggravated by lateral movements, pivoting, rising after sitting and walking.  Patient has had no prior knee problems. Evaluation to date: MRI: abnormal lateral meniscal tear. Treatment to date: rest.  Past Medical History  Diagnosis Date  . Hypertension   . Sleep apnea   . Diabetes mellitus without complication     on metformin  . GERD (gastroesophageal reflux disease)   . Arthritis   . Acute meniscal tear of right knee   . 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 Cancer 2010    prostate    Past Surgical History  Procedure Laterality Date  . Back surgery      lower back  . Back injection      to lower back  . Surgery for collapsed lung Left 35 to 40 years ago  . Prostatectomy  2010    Prior to Admission medications   Medication Sig Start Date End Date Taking? Authorizing Provider  amLODipine (NORVASC) 10 MG tablet Take 10 mg by mouth every morning.    Yes Historical Provider, MD  aspirin 81 MG tablet Take 81 mg by mouth every morning.    Yes Historical Provider, MD  atenolol (TENORMIN) 50 MG tablet Take 25 mg by mouth every morning.    Yes Historical Provider, MD  hydrochlorothiazide (HYDRODIURIL) 25 MG tablet Take 12.5 mg by mouth every morning.    Yes Historical Provider, MD  lisinopril (PRINIVIL,ZESTRIL) 20 MG tablet Take 20 mg by mouth every morning.    Yes Historical Provider, MD  metFORMIN (GLUCOPHAGE) 500 MG tablet Take 500 mg by mouth every morning.    Yes Historical Provider, MD  pantoprazole (PROTONIX) 40 MG tablet Take 40 mg by mouth daily as needed (heart burn).  10/09/14  Yes Historical Provider, MD  rosuvastatin (CRESTOR) 10 MG tablet  Take 10 mg by mouth at bedtime.   Yes Historical Provider, MD  traMADol (ULTRAM) 50 MG tablet Take 50 mg by mouth every 6 (six) hours as needed for moderate pain.  02/13/15  Yes Historical Provider, MD  travoprost, benzalkonium, (TRAVATAN) 0.004 % ophthalmic solution Place 1 drop into both eyes at bedtime.   Yes Historical Provider, MD   KNEE EXAM antalgic gait, soft tissue tenderness over lateral joint line, no  effusion, negative pivot-shift, collateral ligaments intact  Physical Examination: General appearance - alert, well appearing, and in no distress Mental status - alert, oriented to person, place, and time Chest - clear to auscultation, no wheezes, rales or rhonchi, symmetric air entry Heart - normal rate, regular rhythm, normal S1, S2, no murmurs, rubs, clicks or gallops Abdomen - soft, nontender, nondistended, no masses or organomegaly Neurological - alert, oriented, normal speech, no focal findings or movement disorder noted    Asessment/Plan--- Right knee lateral meniscal tear- - Plan right knee arthroscopy with meniscal debridement. Procedure risks and potential comps discussed with patient who elects to proceed. Goals are decreased pain and increased function with a high likelihood of achieving both

## 2015-03-27 NOTE — Discharge Instructions (Signed)
° °Dr. Shatia Sindoni °Total Joint Specialist °[facility] °[address] °[place], [place] [address] °[phone] ° ° °Arthroscopic Procedure, Knee °An arthroscopic procedure can find what is wrong with your knee. °PROCEDURE °Arthroscopy is a surgical technique that allows your orthopedic surgeon to diagnose and treat your knee injury with accuracy. They will look into your knee through a small instrument. This is almost like a small (pencil sized) telescope. Because arthroscopy affects your knee less than open knee surgery, you can anticipate a more rapid recovery. Taking an active role by following your caregiver's instructions will help with rapid and complete recovery. Use crutches, rest, elevation, ice, and knee exercises as instructed. The length of recovery depends on various factors including type of injury, age, physical condition, medical conditions, and your rehabilitation. °Your knee is the joint between the large bones (femur and tibia) in your leg. Cartilage covers these bone ends which are smooth and slippery and allow your knee to bend and move smoothly. Two menisci, thick, semi-lunar shaped pads of cartilage which form a rim inside the joint, help absorb shock and stabilize your knee. Ligaments bind the bones together and support your knee joint. Muscles move the joint, help support your knee, and take stress off the joint itself. Because of this all programs and physical therapy to rehabilitate an injured or repaired knee require rebuilding and strengthening your muscles. °AFTER THE PROCEDURE °· After the procedure, you will be moved to a recovery area until most of the effects of the medication have worn off. Your caregiver will discuss the test results with you.  °· Only take over-the-counter or prescription medicines for pain, discomfort, or fever as directed by your caregiver.  °SEEK MEDICAL CARE IF:  °· You have increased bleeding from your wounds.  °· You see  redness, swelling, or have increasing pain in your wounds.  °· You have pus coming from your wound.  °· You have an oral temperature above 102° F (38.9° C).  °· You notice a bad smell coming from the wound or dressing.  °· You have severe pain with any motion of your knee.  °SEEK IMMEDIATE MEDICAL CARE IF:  °· You develop a rash.  °· You have difficulty breathing.  °· You have any allergic problems.  °FURTHER INSTRUCTIONS:  °· ICE to the affected knee every three hours for 30 minutes at a time and then as needed for pain and swelling.  Continue to use ice on the knee for pain and swelling from surgery. You may notice swelling that will progress down to the foot and ankle.  This is normal after surgery.  Elevate the leg when you are not up walking on it.   ° °DIET °You may resume your previous home diet once your are discharged from the hospital. ° °DRESSING / WOUND CARE / SHOWERING °You may start showering two days after being discharged home but do not submerge the incisions under water.  °Change dressing 48 hours after the procedure and then cover the small incisions with band aids until your follow up visit. °Change the surgical dressings daily and reapply a dry dressing each time.  ° °ACTIVITY °Walk with your walker as instructed. °Use walker as long as suggested by your caregivers. °Avoid periods of inactivity such as sitting longer than an hour when not asleep. This helps prevent blood clots.  °You may resume a sexual relationship in one month or when given the OK by your doctor.  °You may return to   to work once you are cleared by your doctor.  °Do not drive a car for 6 weeks or until released by you surgeon.  °Do not drive while taking narcotics. ° ° °POSTOPERATIVE CONSTIPATION PROTOCOL °Constipation - defined medically as fewer than three stools per week and severe constipation as less than one stool per week. ° °One of the most common issues patients have following surgery is constipation.  Even if you have  a regular bowel pattern at home, your normal regimen is likely to be disrupted due to multiple reasons following surgery.  Combination of anesthesia, postoperative narcotics, change in appetite and fluid intake all can affect your bowels.  In order to avoid complications following surgery, here are some recommendations in order to help you during your recovery period. ° °Colace (docusate) - Pick up an over-the-counter form of Colace or another stool softener and take twice a day as long as you are requiring postoperative pain medications.  Take with a full glass of water daily.  If you experience loose stools or diarrhea, hold the colace until you stool forms back up.  If your symptoms do not get better within 1 week or if they get worse, check with your doctor. ° °Dulcolax (bisacodyl) - Pick up over-the-counter and take as directed by the product packaging as needed to assist with the movement of your bowels.  Take with a full glass of water.  Use this product as needed if not relieved by Colace only.  ° °MiraLax (polyethylene glycol) - Pick up over-the-counter to have on hand.  MiraLax is a solution that will increase the amount of water in your bowels to assist with bowel movements.  Take as directed and can mix with a glass of water, juice, soda, coffee, or tea.  Take if you go more than two days without a movement. °Do not use MiraLax more than once per day. Call your doctor if you are still constipated or irregular after using this medication for 7 days in a row. ° °If you continue to have problems with postoperative constipation, please contact the office for further assistance and recommendations.  If you experience "the worst abdominal pain ever" or develop nausea or vomiting, please contact the office immediatly for further recommendations for treatment. ° °ITCHING ° If you experience itching with your medications, try taking only a single pain pill, or even half a pain pill at a time.  You can also use  Benadryl over the counter for itching or also to help with sleep.  ° °TED HOSE STOCKINGS °Wear the elastic stockings on both legs for three weeks following surgery during the day but you may remove then at night for sleeping. ° °MEDICATIONS °See your medication summary on the “After Visit Summary” that the nursing staff will review with you prior to discharge.  You may have some home medications which will be placed on hold until you complete the course of blood thinner medication.  It is important for you to complete the blood thinner medication as prescribed by your surgeon.  Continue your approved medications as instructed at time of discharge. °Do not drive while taking narcotics.  ° °PRECAUTIONS °If you experience chest pain or shortness of breath - call 911 immediately for transfer to the hospital emergency department.  °If you develop a fever greater that 101 F, purulent drainage from wound, increased redness or drainage from wound, foul odor from the wound/dressing, or calf pain - CONTACT YOUR SURGEON.   °                                                °  FOLLOW-UP APPOINTMENTS °Make sure you keep all of your appointments after your operation with your surgeon and caregivers. You should call the office at [phone]  and make an appointment for approximately one week after the date of your surgery or on the date instructed by your surgeon outlined in the "After Visit Summary". ° °RANGE OF MOTION AND STRENGTHENING EXERCISES  °Rehabilitation of the knee is important following a knee injury or an operation. After just a few days of immobilization, the muscles of the thigh which control the knee become weakened and shrink (atrophy). Knee exercises are designed to build up the tone and strength of the thigh muscles and to improve knee motion. Often times heat used for twenty to thirty minutes before working out will loosen up your tissues and help with improving the range of motion but do not use heat for the  first two weeks following surgery. These exercises can be done on a training (exercise) mat, on the floor, on a table or on a bed. Use what ever works the best and is most comfortable for you Knee exercises include: ° °QUAD STRENGTHENING EXERCISES °Strengthening Quadriceps Sets ° °Tighten muscles on top of thigh by pushing knees down into floor or table. °Hold for 20 seconds. Repeat 10 times. °Do 2 sessions per day. ° ° ° ° °Strengthening Terminal Knee Extension ° °With knee bent over bolster, straighten knee by tightening muscle on top of thigh. Be sure to keep bottom of knee on bolster. °Hold for 20 seconds. Repeat 10 times. °Do 2 sessions per day. ° ° °Straight Leg with Bent Knee ° °Lie on back with opposite leg bent. Keep involved knee slightly bent at knee and raise leg 4-6". Hold for 10 seconds. °Repeat 20 times per set. °Do 2 sets per session. °Do 2 sessions per day. ° °

## 2015-03-27 NOTE — Op Note (Signed)
Preoperative diagnosis-  Right knee lateral meniscal tear  Postoperative diagnosis Right- knee lateral meniscal tear   Procedure- Right knee arthroscopy with lateral  meniscal debridement    Surgeon- Dione Plover. Karenna Romanoff, MD  Anesthesia-General  EBL-  Minimal  Complications- None  Condition- PACU - hemodynamically stable.  Brief clinical note- -[name] is a 74 y.o.  male with a several month history of right knee pain and mechanical symptoms. Exam and history suggested lateral meniscal tear confirmed by MRI. The patient presents now for arthroscopy and debridement  Procedure in detail -       After successful administration of General anesthetic, a tourmiquet is placed high on the Right  thigh and the Right lower extremity is prepped and draped in the usual sterile fashion. Time out is performed by the surgical team. Standard superomedial and inferolateral portal sites are marked and incisions made with an 11 blade. The inflow cannula is passed through the superomedial portal and camera through the inferolateral portal and inflow is initiated. Arthroscopic visualization proceeds.      The undersurface of the patella and trochlea are visualized and they appear normal. The medial and lateral gutters are visualized and there are  no loose bodies. Flexion and valgus force is applied to the knee and the medial compartment is entered. A spinal needle is passed into the joint through the site marked for the inferomedial portal. A small incision is made and the dilator passed into the joint. The findings for the medial compartment are normal .     The intercondylar notch is visualized and the ACL appears normal . The lateral compartment is entered and the findings are grossly unstable tear body and posterior horn lateral meniscus with grade II chondromalacia but no unstable chondral defect. . The tear is debrided to a stable base with baskets and a shaver and sealed off with the Arthrocare.The  meniscal remnant is probed and found to be stable.     The joint is again inspected and there are no other tears, defects or loose bodies identified. The arthroscopic equipment is then removed from the inferior portals which are closed with interrupted 4-0 nylon. 20 ml of .25% Marcaine with epinephrine are injected through the inflow cannula and the cannula is then removed and the portal closed with nylon. The incisions are cleaned and dried and a bulky sterile dressing is applied. The patient is then awakened and transported to recovery in stable condition.   03/27/2015, 10:59 AM

## 2015-03-27 NOTE — Transfer of Care (Signed)
Immediate Anesthesia Transfer of Care Note  Patient: [name]  Procedure(s) Performed: Procedure(s): RIGHT ARTHROSCOPY KNEE WITH DEBRIDEMENT, PARTIAL LATERAL MENISCECTOMY (Right)  Patient Location: PACU  Anesthesia Type:General  Level of Consciousness:  sedated, patient cooperative and responds to stimulation  Airway & Oxygen Therapy:Patient Spontanous Breathing and Patient connected to face mask oxgen  Post-op Assessment:  Report given to PACU RN and Post -op Vital signs reviewed and stable  Post vital signs:  Reviewed and stable  Last Vitals:  Filed Vitals:   03/27/15 0818  BP: 130/64  Pulse: 50  Temp: 36.8 C  Resp: 18    Complications: No apparent anesthesia complications

## 2015-03-27 NOTE — Anesthesia Postprocedure Evaluation (Signed)
  Anesthesia Post-op Note  Patient: [name]  Procedure(s) Performed: Procedure(s) (LRB): RIGHT ARTHROSCOPY KNEE WITH DEBRIDEMENT, PARTIAL LATERAL MENISCECTOMY (Right)  Patient Location: PACU  Anesthesia Type: General  Level of Consciousness: awake and alert   Airway and Oxygen Therapy: Patient Spontanous Breathing  Post-op Pain: mild  Post-op Assessment: Post-op Vital signs reviewed, Patient's Cardiovascular Status Stable, Respiratory Function Stable, Patent Airway and No signs of Nausea or vomiting  Last Vitals:  Filed Vitals:   03/27/15 1237  BP: 138/80  Pulse: 63  Temp: 36.4 C  Resp: 16    Post-op Vital Signs: stable   Complications: No apparent anesthesia complications

## 2015-03-27 NOTE — Interval H&P Note (Signed)
History and Physical Interval Note:  03/27/2015 9:45 AM  [name]  has presented today for surgery, with the diagnosis of RIGHT KNEE LATERAL MENISCUS TEAR  The various methods of treatment have been discussed with the patient and family. After consideration of risks, benefits and other options for treatment, the patient has consented to  Procedure(s): RIGHT ARTHROSCOPY KNEE WITH DEBRIDEMENT (Right) as a surgical intervention .  The patient's history has been reviewed, patient examined, no change in status, stable for surgery.  I have reviewed the patient's chart and labs.  Questions were answered to the patient's satisfaction.     Gearlean Alf

## 2015-03-28 ENCOUNTER — Encounter (HOSPITAL_COMMUNITY): Payer: Self-pay | Admitting: Orthopedic Surgery

## 2015-04-02 DIAGNOSIS — M23351 Other meniscus derangements, posterior horn of lateral meniscus, right knee: Secondary | ICD-10-CM | POA: Diagnosis not present

## 2015-04-02 DIAGNOSIS — Z4789 Encounter for other orthopedic aftercare: Secondary | ICD-10-CM | POA: Diagnosis not present

## 2015-04-15 DIAGNOSIS — M5412 Radiculopathy, cervical region: Secondary | ICD-10-CM | POA: Diagnosis not present

## 2015-04-15 DIAGNOSIS — M9981 Other biomechanical lesions of cervical region: Secondary | ICD-10-CM | POA: Diagnosis not present

## 2015-04-15 DIAGNOSIS — M4802 Spinal stenosis, cervical region: Secondary | ICD-10-CM | POA: Insufficient documentation

## 2015-04-15 DIAGNOSIS — E119 Type 2 diabetes mellitus without complications: Secondary | ICD-10-CM | POA: Diagnosis not present

## 2015-04-15 DIAGNOSIS — Z6833 Body mass index (BMI) 33.0-33.9, adult: Secondary | ICD-10-CM | POA: Diagnosis not present

## 2015-04-17 DIAGNOSIS — M5412 Radiculopathy, cervical region: Secondary | ICD-10-CM | POA: Diagnosis not present

## 2015-04-17 DIAGNOSIS — Z6833 Body mass index (BMI) 33.0-33.9, adult: Secondary | ICD-10-CM | POA: Diagnosis not present

## 2015-04-17 DIAGNOSIS — M9981 Other biomechanical lesions of cervical region: Secondary | ICD-10-CM | POA: Diagnosis not present

## 2015-04-19 DIAGNOSIS — E669 Obesity, unspecified: Secondary | ICD-10-CM | POA: Diagnosis not present

## 2015-04-19 DIAGNOSIS — E119 Type 2 diabetes mellitus without complications: Secondary | ICD-10-CM | POA: Diagnosis not present

## 2015-04-19 DIAGNOSIS — Z0000 Encounter for general adult medical examination without abnormal findings: Secondary | ICD-10-CM | POA: Diagnosis not present

## 2015-04-19 DIAGNOSIS — Z1389 Encounter for screening for other disorder: Secondary | ICD-10-CM | POA: Diagnosis not present

## 2015-04-19 DIAGNOSIS — Z79899 Other long term (current) drug therapy: Secondary | ICD-10-CM | POA: Diagnosis not present

## 2015-04-19 DIAGNOSIS — Z6833 Body mass index (BMI) 33.0-33.9, adult: Secondary | ICD-10-CM | POA: Diagnosis not present

## 2015-04-19 DIAGNOSIS — I10 Essential (primary) hypertension: Secondary | ICD-10-CM | POA: Diagnosis not present

## 2015-04-19 DIAGNOSIS — D696 Thrombocytopenia, unspecified: Secondary | ICD-10-CM | POA: Diagnosis not present

## 2015-04-23 DIAGNOSIS — M25351 Other instability, right hip: Secondary | ICD-10-CM | POA: Diagnosis not present

## 2015-04-23 DIAGNOSIS — M1711 Unilateral primary osteoarthritis, right knee: Secondary | ICD-10-CM | POA: Diagnosis not present

## 2015-04-23 DIAGNOSIS — Z4789 Encounter for other orthopedic aftercare: Secondary | ICD-10-CM | POA: Diagnosis not present

## 2015-04-29 DIAGNOSIS — H40052 Ocular hypertension, left eye: Secondary | ICD-10-CM | POA: Diagnosis not present

## 2015-05-21 DIAGNOSIS — Z4789 Encounter for other orthopedic aftercare: Secondary | ICD-10-CM | POA: Diagnosis not present

## 2015-06-26 DIAGNOSIS — J329 Chronic sinusitis, unspecified: Secondary | ICD-10-CM | POA: Diagnosis not present

## 2015-06-26 DIAGNOSIS — J309 Allergic rhinitis, unspecified: Secondary | ICD-10-CM | POA: Diagnosis not present

## 2015-06-27 DIAGNOSIS — D225 Melanocytic nevi of trunk: Secondary | ICD-10-CM | POA: Diagnosis not present

## 2015-06-27 DIAGNOSIS — L986 Other infiltrative disorders of the skin and subcutaneous tissue: Secondary | ICD-10-CM | POA: Diagnosis not present

## 2015-06-27 DIAGNOSIS — C8380 Other non-follicular lymphoma, unspecified site: Secondary | ICD-10-CM | POA: Diagnosis not present

## 2015-06-27 DIAGNOSIS — L821 Other seborrheic keratosis: Secondary | ICD-10-CM | POA: Diagnosis not present

## 2015-07-02 ENCOUNTER — Telehealth: Payer: Self-pay | Admitting: *Deleted

## 2015-07-02 ENCOUNTER — Ambulatory Visit (HOSPITAL_BASED_OUTPATIENT_CLINIC_OR_DEPARTMENT_OTHER): Payer: Medicare Other | Admitting: Oncology

## 2015-07-02 ENCOUNTER — Telehealth: Payer: Self-pay | Admitting: Oncology

## 2015-07-02 VITALS — BP 144/57 | HR 50 | Temp 98.4°F | Resp 18 | Ht 71.5 in | Wt 237.9 lb

## 2015-07-02 DIAGNOSIS — C884 Extranodal marginal zone B-cell lymphoma of mucosa-associated lymphoid tissue [MALT-lymphoma]: Secondary | ICD-10-CM

## 2015-07-02 DIAGNOSIS — Z8546 Personal history of malignant neoplasm of prostate: Secondary | ICD-10-CM | POA: Diagnosis not present

## 2015-07-02 DIAGNOSIS — Z8579 Personal history of other malignant neoplasms of lymphoid, hematopoietic and related tissues: Secondary | ICD-10-CM

## 2015-07-02 NOTE — Progress Notes (Signed)
  [place] OFFICE PROGRESS NOTE   Diagnosis: MALT lymphoma  INTERVAL HISTORY:   [name] returns as scheduled. He feels well. Good appetite. No fever or night sweats. He noted the right postauricular scar was slightly raised. He saw Dr. Pablo Moon and had a biopsy last week (the pathology report is not yet available)  Objective:  Vital signs in last 24 hours:  Blood pressure 144/57, pulse 50, temperature 98.4 F (36.9 C), temperature source Oral, resp. rate 18, height 5' 11.5" (1.816 m), weight 237 lb 14.4 oz (107.911 kg), SpO2 99 %.    HEENT: Neck without mass Lymphatics: No cervical, supra-clavicular, axillary, or inguinal nodes Resp: Lungs clear bilaterally  Cardio: Regular rate and rhythm GI: No hepatosplenomegaly, no mass, nontender Vascular: No leg edema  Skin: Healing biopsy site in the right postauricular area, no suspicious nodularity     Lab Results:  Lab Results  Component Value Date   WBC 7.5 03/20/2015   HGB 13.7 03/20/2015   HCT 39.8 03/20/2015   MCV 89.8 03/20/2015   PLT 163 03/20/2015   NEUTROABS 4.4 04/30/2014     Medications: I have reviewed the patient's current medications.  Assessment/Plan: 1. 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 Prostate cancer diagnosed in 2009, status post prostatectomy  3. Diabetes  4. Hypertension  5. hyperlipidemia    Disposition:  Mr. [name] remains in clinical remission from non-Hodgkin's lymphoma. We will follow-up on the skin biopsy from last week.  He will return for an office visit in one year. He will contact us in the interim for new symptoms.  Betsy Coder, MD  07/02/2015  9:50 AM

## 2015-07-02 NOTE — Telephone Encounter (Signed)
Requested derm pathology report from 12/8 biopsy. Spoke with Stanton Kidney at Dr. Juanna Cao office. It is not resulted yet.

## 2015-07-02 NOTE — Telephone Encounter (Signed)
Gave and printed appt sched and avs for pt for DEC 2017 °

## 2015-07-05 NOTE — Telephone Encounter (Signed)
[facility] for path report. Received report, placed on Dr. Gearldine Shown desk for review, sent copy to be scanned.

## 2015-07-08 ENCOUNTER — Other Ambulatory Visit: Payer: Self-pay | Admitting: *Deleted

## 2015-07-08 ENCOUNTER — Telehealth: Payer: Self-pay | Admitting: Oncology

## 2015-07-08 NOTE — Telephone Encounter (Signed)
per pof to sch pt appt-cld & spoke to pt and gave appt time & date-pt understood °

## 2015-07-09 ENCOUNTER — Ambulatory Visit (HOSPITAL_BASED_OUTPATIENT_CLINIC_OR_DEPARTMENT_OTHER): Payer: Medicare Other | Admitting: Oncology

## 2015-07-09 ENCOUNTER — Telehealth: Payer: Self-pay | Admitting: Oncology

## 2015-07-09 VITALS — BP 131/56 | HR 46 | Temp 98.0°F | Resp 18 | Ht 71.5 in | Wt 237.1 lb

## 2015-07-09 DIAGNOSIS — C884 Extranodal marginal zone B-cell lymphoma of mucosa-associated lymphoid tissue [MALT-lymphoma]: Secondary | ICD-10-CM

## 2015-07-09 NOTE — Progress Notes (Signed)
  [facility] OFFICE PROGRESS NOTE   Diagnosis: Marginal zone lymphoma  INTERVAL HISTORY:   [name] returns prior to a scheduled visit. The biopsy from the right retroauricular area 06/27/2015 revealed a lymphoproliferative infiltrate consistent with marginal zone lymphoma. The biopsy appears similar to the biopsy from 2015. He feels well. No complaint.  Objective:  Vital signs in last 24 hours:  Blood pressure 131/56, pulse 46, temperature 98 F (36.7 C), temperature source Oral, resp. rate 18, height 5' 11.5" (1.816 m), weight 237 lb 1.6 oz (107.548 kg), SpO2 100 %.    HEENT: Oropharynx without visible mass, neck without mass Lymphatics: No cervical, supra-clavicular, axillary, or inguinal nodes Resp: Lungs clear bilaterally Cardio: Regular rate and rhythm GI: No hepatosplenomegaly Vascular: No leg edema  Skin: Scar at the right retro-auricular area is slightly raised. A few millimeters inferior to the scar the skin is slightly raised without a discrete mass     Lab Results:  Lab Results  Component Value Date   WBC 7.5 03/20/2015   HGB 13.7 03/20/2015   HCT 39.8 03/20/2015   MCV 89.8 03/20/2015   PLT 163 03/20/2015   NEUTROABS 4.4 04/30/2014     Medications: I have reviewed the patient's current medications.  Assessment/Plan: 1. 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 Prostate cancer diagnosed in 2009, status post prostatectomy  3. Diabetes  4. Hypertension  5. hyperlipidemia    Disposition:  [name] has been diagnosed with recurrent MALT lymphoma at a right retroauricular scar. There is no clinical evidence of systemic disease. I do not recommend staging CT scans.  Treatment options include observation, reexcision of the scar, and radiation. I favor  radiation. I discussed the case with a radiation oncologist and they are in agreement. We made a referral to radiation oncology today. I will discuss the case with Dr. Elvera Lennox to be sure he is in agreement.  [name] will return for an office visit in 6 months. I am available to see him sooner as needed.  Betsy Coder, MD  07/09/2015  10:13 AM

## 2015-07-09 NOTE — Telephone Encounter (Signed)
Gave patient avs report and appointment for 07/24/15 with Dr. Pablo Ledger and June with Dr. Benay Spice.

## 2015-07-24 ENCOUNTER — Ambulatory Visit
Admission: RE | Admit: 2015-07-24 | Discharge: 2015-07-24 | Disposition: A | Discharge status: Home / Self Care | Payer: Medicare Other | Source: Ambulatory Visit | Attending: Radiation Oncology | Admitting: Radiation Oncology

## 2015-07-24 ENCOUNTER — Encounter: Payer: Self-pay | Admitting: Radiation Oncology

## 2015-07-24 VITALS — BP 132/62 | HR 50 | Temp 97.9°F | Ht 71.5 in | Wt 238.2 lb

## 2015-07-24 DIAGNOSIS — Z510 Encounter for antineoplastic radiation therapy: Secondary | ICD-10-CM | POA: Diagnosis not present

## 2015-07-24 DIAGNOSIS — C884 Extranodal marginal zone B-cell lymphoma of mucosa-associated lymphoid tissue [MALT-lymphoma]: Secondary | ICD-10-CM

## 2015-07-24 DIAGNOSIS — C8590 Non-Hodgkin lymphoma, unspecified, unspecified site: Secondary | ICD-10-CM | POA: Insufficient documentation

## 2015-07-24 NOTE — Addendum Note (Signed)
Encounter addended by: Malena Edman, RN on: 07/24/2015  4:29 PM<BR>     Documentation filed: Charges VN

## 2015-07-24 NOTE — Progress Notes (Addendum)
  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 male who presented with a retroauricular lesion 9 years ago which was excised and he was told this was benign. This lesion reappeared and was evaluated by dermatology. Shave biopsy on 03/30/14 showed a marginal zone lymphoma. This was felt to be grossly excised so no adjuvant treatment was recommended. He has been followed by Dr.Sherrill. He was noted to have recurrent disease in December of last year and this showed a similar appearing marginal zone lymphoma. He reports no pain with this mass and no other areas of swelling. No fevers or chills. No weight loss, no night sweats. He is not interested in further surgery and therefore was referred for consideration of definitive radiation in the management of his disease. He has not had any recent imaging related to this area.   PREVIOUS RADIATION THERAPY: No  Past medical, social and family history were reviewed in the electronic chart. Review of symptoms was reviewed in the electronic chart. Medications were reviewed in the electronic chart.   PHYSICAL EXAM:  Filed Vitals:   07/24/15 1314  BP: 132/62  Pulse: 50  Temp: 97.9 F (36.6 C)  .238 lb 3.2 oz (108.047 kg). Small subcutaneous nodule measuring 5 mm in the posterior auricular area of the right ear. No palpable cervical, surpaclavicular or submental/mandibular adenopathy.   IMPRESSION: T1N0 Stage I MALT lymphoma of the right posterior ear.  PLAN: Since he has  failed local excision he would benefit from radiation to the local area with minimal side effects. I plan for a dosage of 30 Gy using electrons. We will set him up for simulation when I return the week of the 16th. He can be simulated on the treatment machine. Informed consent was signed.  Mr. Stromquist received a sim appointment for 1/17 at 2pm.   Alternative treatments including surgical excision and chemotherapy were discussed. Systemic failures were discussed.   I spent 30 minutes  face to face with the patient and more than 50% of that time was spent in counseling and/or coordination of care.   ------------------------------------------------  Thea Silversmith, MD  This document serves as a record of services personally performed by Thea Silversmith, MD. It was created on her behalf by Derek Mound, a trained medical scribe. The creation of this record is based on the scribe's personal observations and the provider's statements to them. This document has been checked and approved by the attending provider.

## 2015-08-02 ENCOUNTER — Encounter: Payer: Self-pay | Admitting: *Deleted

## 2015-08-02 NOTE — Progress Notes (Signed)
[facility] Psychosocial Distress Screening Clinical Social Work  Clinical Social Work was referred by distress screening protocol.  The patient scored a 8 on the Psychosocial Distress Thermometer which indicates severe distress. Clinical Social Worker phoned pt to assess for distress and other psychosocial needs. Pt reports he got all of his questions answered the day of his visit and he feels very reassured about his plan. CSW reviewed resources to assist and explained role of CSW/Pt and Family Support Team. Pt agrees to reach out as needs arise.   ONCBCN DISTRESS SCREENING 07/24/2015  Screening Type Initial Screening  Distress experienced in past week (1-10) 8  Information Concerns Type Lack of info about treatment    Clinical Social Worker follow up needed: No.  If yes, follow up plan: Loren Racer, [place]  [facility] Phone: [phone] Fax: [phone]

## 2015-08-06 ENCOUNTER — Ambulatory Visit: Payer: Medicare Other | Admitting: Radiation Oncology

## 2015-08-07 ENCOUNTER — Ambulatory Visit
Admission: RE | Admit: 2015-08-07 | Discharge: 2015-08-07 | Disposition: A | Discharge status: Home / Self Care | Payer: Medicare Other | Source: Ambulatory Visit | Attending: Radiation Oncology | Admitting: Radiation Oncology

## 2015-08-07 DIAGNOSIS — C8590 Non-Hodgkin lymphoma, unspecified, unspecified site: Secondary | ICD-10-CM | POA: Diagnosis not present

## 2015-08-07 DIAGNOSIS — C884 Extranodal marginal zone B-cell lymphoma of mucosa-associated lymphoid tissue [MALT-lymphoma]: Secondary | ICD-10-CM

## 2015-08-07 DIAGNOSIS — Z510 Encounter for antineoplastic radiation therapy: Secondary | ICD-10-CM | POA: Diagnosis not present

## 2015-08-07 NOTE — Addendum Note (Signed)
Encounter addended by: Thea Silversmith, MD on: 08/07/2015  5:23 PM<BR>     Documentation filed: Notes Section

## 2015-08-07 NOTE — Progress Notes (Signed)
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    NARRATIVE: The patient was brought to the [facility].  Identity was confirmed.  All relevant records and images related to the planned course of therapy were reviewed.  Then, the patient was positioned in a stable reproducible clinical set-up for radiation therapy. I marked out the area to be treated around his lesion. Custom bolus was made to filling the gap between the pinna and the scalp. This was secured. 5 mm bolus was then placed over this and secured to his head. I confirmed that the cone would effectively treat the lesion at depth.  The radiation prescription was entered and confirmed.   TREATMENT PLANNING NOTE:  Treatment planning then occurred. I have requested a special port plan with 2 complex treatment devices used in the form of custom bolus and a block.

## 2015-08-08 ENCOUNTER — Ambulatory Visit
Admission: RE | Admit: 2015-08-08 | Discharge: 2015-08-08 | Disposition: A | Discharge status: Home / Self Care | Payer: Medicare Other | Source: Ambulatory Visit | Attending: Radiation Oncology | Admitting: Radiation Oncology

## 2015-08-08 DIAGNOSIS — C8590 Non-Hodgkin lymphoma, unspecified, unspecified site: Secondary | ICD-10-CM | POA: Diagnosis not present

## 2015-08-08 DIAGNOSIS — Z510 Encounter for antineoplastic radiation therapy: Secondary | ICD-10-CM | POA: Diagnosis not present

## 2015-08-08 DIAGNOSIS — C884 Extranodal marginal zone B-cell lymphoma of mucosa-associated lymphoid tissue [MALT-lymphoma]: Secondary | ICD-10-CM | POA: Diagnosis not present

## 2015-08-09 ENCOUNTER — Ambulatory Visit
Admission: RE | Admit: 2015-08-09 | Discharge: 2015-08-09 | Disposition: A | Discharge status: Home / Self Care | Payer: Medicare Other | Source: Ambulatory Visit | Attending: Radiation Oncology | Admitting: Radiation Oncology

## 2015-08-09 DIAGNOSIS — Z510 Encounter for antineoplastic radiation therapy: Secondary | ICD-10-CM | POA: Diagnosis not present

## 2015-08-09 DIAGNOSIS — C8590 Non-Hodgkin lymphoma, unspecified, unspecified site: Secondary | ICD-10-CM | POA: Diagnosis not present

## 2015-08-12 ENCOUNTER — Ambulatory Visit
Admission: RE | Admit: 2015-08-12 | Discharge: 2015-08-12 | Disposition: A | Discharge status: Home / Self Care | Payer: Medicare Other | Source: Ambulatory Visit | Attending: Radiation Oncology | Admitting: Radiation Oncology

## 2015-08-12 DIAGNOSIS — Z510 Encounter for antineoplastic radiation therapy: Secondary | ICD-10-CM | POA: Diagnosis not present

## 2015-08-12 DIAGNOSIS — C8590 Non-Hodgkin lymphoma, unspecified, unspecified site: Secondary | ICD-10-CM | POA: Diagnosis not present

## 2015-08-13 ENCOUNTER — Ambulatory Visit
Admission: RE | Admit: 2015-08-13 | Discharge: 2015-08-13 | Disposition: A | Discharge status: Home / Self Care | Payer: Medicare Other | Source: Ambulatory Visit | Attending: Radiation Oncology | Admitting: Radiation Oncology

## 2015-08-13 ENCOUNTER — Encounter: Payer: Self-pay | Admitting: Radiation Oncology

## 2015-08-13 VITALS — BP 136/63 | HR 52 | Temp 98.3°F | Ht 71.5 in | Wt 239.3 lb

## 2015-08-13 DIAGNOSIS — Z510 Encounter for antineoplastic radiation therapy: Secondary | ICD-10-CM | POA: Diagnosis not present

## 2015-08-13 DIAGNOSIS — H40053 Ocular hypertension, bilateral: Secondary | ICD-10-CM | POA: Diagnosis not present

## 2015-08-13 DIAGNOSIS — C884 Extranodal marginal zone B-cell lymphoma of mucosa-associated lymphoid tissue [MALT-lymphoma]: Secondary | ICD-10-CM

## 2015-08-13 DIAGNOSIS — C8590 Non-Hodgkin lymphoma, unspecified, unspecified site: Secondary | ICD-10-CM | POA: Diagnosis not present

## 2015-08-13 NOTE — Progress Notes (Signed)
Weekly Management Note Current Dose: 8  Gy  Projected Dose: 30 Gy   Narrative:  The patient presents for routine under treatment assessment.  CBCT/MVCT images/Port film x-rays were reviewed.  The chart was checked. Doing well. No complaints.   Physical Findings: Weight: 239 lb 4.8 oz (108.546 kg). Unchanged  Impression:  The patient is tolerating radiation.  Plan:  Continue treatment as planned.

## 2015-08-13 NOTE — Progress Notes (Signed)
[name] has received 4 fractions to his right ear.  Denies any pain to his right ear.  Skin looks good to the right ear today.  Appetite is good.  Energy level good, goes to the gym to work out. Saw eye doctor for a regular eye exam today. BP 136/63 mmHg  Pulse 52  Temp(Src) 98.3 F (36.8 C) (Oral)  Ht 5' 11.5" (1.816 m)  Wt 239 lb 4.8 oz (108.546 kg)  BMI 32.91 kg/m2  SpO2 100%  Wt Readings from Last 3 Encounters:  08/13/15 239 lb 4.8 oz (108.546 kg)  07/24/15 238 lb 3.2 oz (108.047 kg)  07/09/15 237 lb 1.6 oz (107.548 kg)

## 2015-08-14 ENCOUNTER — Ambulatory Visit
Admission: RE | Admit: 2015-08-14 | Discharge: 2015-08-14 | Disposition: A | Discharge status: Home / Self Care | Payer: Medicare Other | Source: Ambulatory Visit | Attending: Radiation Oncology | Admitting: Radiation Oncology

## 2015-08-14 DIAGNOSIS — Z510 Encounter for antineoplastic radiation therapy: Secondary | ICD-10-CM | POA: Diagnosis not present

## 2015-08-14 DIAGNOSIS — C884 Extranodal marginal zone B-cell lymphoma of mucosa-associated lymphoid tissue [MALT-lymphoma]: Secondary | ICD-10-CM | POA: Diagnosis not present

## 2015-08-14 DIAGNOSIS — C8590 Non-Hodgkin lymphoma, unspecified, unspecified site: Secondary | ICD-10-CM | POA: Diagnosis not present

## 2015-08-15 ENCOUNTER — Ambulatory Visit
Admission: RE | Admit: 2015-08-15 | Discharge: 2015-08-15 | Disposition: A | Discharge status: Home / Self Care | Payer: Medicare Other | Source: Ambulatory Visit | Attending: Radiation Oncology | Admitting: Radiation Oncology

## 2015-08-15 DIAGNOSIS — Z510 Encounter for antineoplastic radiation therapy: Secondary | ICD-10-CM | POA: Diagnosis not present

## 2015-08-15 DIAGNOSIS — C8590 Non-Hodgkin lymphoma, unspecified, unspecified site: Secondary | ICD-10-CM | POA: Diagnosis not present

## 2015-08-16 ENCOUNTER — Ambulatory Visit
Admission: RE | Admit: 2015-08-16 | Discharge: 2015-08-16 | Disposition: A | Discharge status: Home / Self Care | Payer: Medicare Other | Source: Ambulatory Visit | Attending: Radiation Oncology | Admitting: Radiation Oncology

## 2015-08-16 DIAGNOSIS — Z510 Encounter for antineoplastic radiation therapy: Secondary | ICD-10-CM | POA: Diagnosis not present

## 2015-08-16 DIAGNOSIS — C8590 Non-Hodgkin lymphoma, unspecified, unspecified site: Secondary | ICD-10-CM | POA: Diagnosis not present

## 2015-08-19 ENCOUNTER — Ambulatory Visit
Admission: RE | Admit: 2015-08-19 | Discharge: 2015-08-19 | Disposition: A | Discharge status: Home / Self Care | Payer: Medicare Other | Source: Ambulatory Visit | Attending: Radiation Oncology | Admitting: Radiation Oncology

## 2015-08-19 DIAGNOSIS — C8590 Non-Hodgkin lymphoma, unspecified, unspecified site: Secondary | ICD-10-CM | POA: Diagnosis not present

## 2015-08-19 DIAGNOSIS — Z510 Encounter for antineoplastic radiation therapy: Secondary | ICD-10-CM | POA: Diagnosis not present

## 2015-08-20 ENCOUNTER — Ambulatory Visit
Admission: RE | Admit: 2015-08-20 | Discharge: 2015-08-20 | Disposition: A | Discharge status: Home / Self Care | Payer: Medicare Other | Source: Ambulatory Visit | Attending: Radiation Oncology | Admitting: Radiation Oncology

## 2015-08-20 DIAGNOSIS — Z510 Encounter for antineoplastic radiation therapy: Secondary | ICD-10-CM | POA: Diagnosis not present

## 2015-08-20 DIAGNOSIS — C8590 Non-Hodgkin lymphoma, unspecified, unspecified site: Secondary | ICD-10-CM | POA: Diagnosis not present

## 2015-08-21 ENCOUNTER — Ambulatory Visit
Admission: RE | Admit: 2015-08-21 | Discharge: 2015-08-21 | Disposition: A | Discharge status: Home / Self Care | Payer: Medicare Other | Source: Ambulatory Visit | Attending: Radiation Oncology | Admitting: Radiation Oncology

## 2015-08-21 ENCOUNTER — Encounter: Payer: Self-pay | Admitting: Radiation Oncology

## 2015-08-21 VITALS — BP 131/67 | HR 70 | Temp 97.7°F | Ht 71.5 in | Wt 239.2 lb

## 2015-08-21 DIAGNOSIS — Z510 Encounter for antineoplastic radiation therapy: Secondary | ICD-10-CM | POA: Diagnosis not present

## 2015-08-21 DIAGNOSIS — C884 Extranodal marginal zone B-cell lymphoma of mucosa-associated lymphoid tissue [MALT-lymphoma]: Secondary | ICD-10-CM | POA: Diagnosis not present

## 2015-08-21 DIAGNOSIS — C8590 Non-Hodgkin lymphoma, unspecified, unspecified site: Secondary | ICD-10-CM | POA: Diagnosis not present

## 2015-08-21 MED ORDER — RADIAPLEXRX EX GEL
Freq: Once | CUTANEOUS | Status: AC
  Administered 2015-08-21: 11:00:00 via TOPICAL

## 2015-08-21 NOTE — Progress Notes (Signed)
Weekly Management Note Current Dose: 20  Gy  Projected Dose: 30 Gy   Narrative:  The patient presents for routine under treatment assessment.  CBCT/MVCT images/Port film x-rays were reviewed. The chart was checked.  The patient has received 10 fractions. He denies pain, problems with hearing, or headaches. Good appetite. The right ear color has become darker and it itches. Radiaplex was given with instructions. Energy level is normal.  Physical Findings: Weight: 239 lb 3.2 oz (108.5 kg). Lesion behind the right ear is more flat and the skin around it is slightly dark.  Impression:  The patient is tolerating radiation.  Plan:  Continue treatment as planned. She will start radiaplex.   This document serves as a record of services personally performed by Thea Silversmith, MD. It was created on her behalf by Darcus Austin, a trained medical scribe. The creation of this record is based on the scribe's personal observations and the provider's statements to them. This document has been checked and approved by the attending provider.

## 2015-08-21 NOTE — Progress Notes (Signed)
[name] has received 10 fractions.  Denies pain today.  Appetite good.  Skin looks good to right ear color darker has some itching.  Radiaplex given with instructions.  Energy level normal during daily routine without any difficulty. No problems with his hearing or headaches. BP 131/67 mmHg  Pulse 70  Temp(Src) 97.7 F (36.5 C) (Oral)  Ht 5' 11.5" (1.816 m)  Wt 239 lb 3.2 oz (108.5 kg)  BMI 32.90 kg/m2  SpO2 100%

## 2015-08-22 ENCOUNTER — Ambulatory Visit
Admission: RE | Admit: 2015-08-22 | Discharge: 2015-08-22 | Disposition: A | Discharge status: Home / Self Care | Payer: Medicare Other | Source: Ambulatory Visit | Attending: Radiation Oncology | Admitting: Radiation Oncology

## 2015-08-22 DIAGNOSIS — C8590 Non-Hodgkin lymphoma, unspecified, unspecified site: Secondary | ICD-10-CM | POA: Diagnosis not present

## 2015-08-22 DIAGNOSIS — Z510 Encounter for antineoplastic radiation therapy: Secondary | ICD-10-CM | POA: Diagnosis not present

## 2015-08-23 ENCOUNTER — Ambulatory Visit
Admission: RE | Admit: 2015-08-23 | Discharge: 2015-08-23 | Disposition: A | Discharge status: Home / Self Care | Payer: Medicare Other | Source: Ambulatory Visit | Attending: Radiation Oncology | Admitting: Radiation Oncology

## 2015-08-23 DIAGNOSIS — Z510 Encounter for antineoplastic radiation therapy: Secondary | ICD-10-CM | POA: Diagnosis not present

## 2015-08-23 DIAGNOSIS — C8590 Non-Hodgkin lymphoma, unspecified, unspecified site: Secondary | ICD-10-CM | POA: Diagnosis not present

## 2015-08-26 ENCOUNTER — Ambulatory Visit
Admission: RE | Admit: 2015-08-26 | Discharge: 2015-08-26 | Disposition: A | Discharge status: Home / Self Care | Payer: Medicare Other | Source: Ambulatory Visit | Attending: Radiation Oncology | Admitting: Radiation Oncology

## 2015-08-26 DIAGNOSIS — C8590 Non-Hodgkin lymphoma, unspecified, unspecified site: Secondary | ICD-10-CM | POA: Diagnosis not present

## 2015-08-26 DIAGNOSIS — Z510 Encounter for antineoplastic radiation therapy: Secondary | ICD-10-CM | POA: Diagnosis not present

## 2015-08-27 ENCOUNTER — Ambulatory Visit
Admission: RE | Admit: 2015-08-27 | Discharge: 2015-08-27 | Disposition: A | Discharge status: Home / Self Care | Payer: Medicare Other | Source: Ambulatory Visit | Attending: Radiation Oncology | Admitting: Radiation Oncology

## 2015-08-27 DIAGNOSIS — C884 Extranodal marginal zone B-cell lymphoma of mucosa-associated lymphoid tissue [MALT-lymphoma]: Secondary | ICD-10-CM

## 2015-08-27 DIAGNOSIS — C8590 Non-Hodgkin lymphoma, unspecified, unspecified site: Secondary | ICD-10-CM | POA: Diagnosis not present

## 2015-08-27 DIAGNOSIS — Z510 Encounter for antineoplastic radiation therapy: Secondary | ICD-10-CM | POA: Diagnosis not present

## 2015-08-27 NOTE — Progress Notes (Signed)
Weekly Management Note Current Dose: 28  Gy  Projected Dose: 30 Gy   Narrative:  The patient presents for routine under treatment assessment.  CBCT/MVCT images/Port film x-rays were reviewed.  The chart was checked. Doing well. Skin in ear is slightly darker.   Physical Findings: Area behind ear is flat. Skin around ear and on pinna is dark/pink.   Impression:  The patient is tolerating radiation.  Plan:  Continue treatment as planned. Add radiaplex to ear. Follow up in 2 weeks.

## 2015-08-28 ENCOUNTER — Encounter: Payer: Self-pay | Admitting: Radiation Oncology

## 2015-08-28 ENCOUNTER — Ambulatory Visit
Admission: RE | Admit: 2015-08-28 | Discharge: 2015-08-28 | Disposition: A | Discharge status: Home / Self Care | Payer: Medicare Other | Source: Ambulatory Visit | Attending: Radiation Oncology | Admitting: Radiation Oncology

## 2015-08-28 DIAGNOSIS — C884 Extranodal marginal zone B-cell lymphoma of mucosa-associated lymphoid tissue [MALT-lymphoma]: Secondary | ICD-10-CM | POA: Diagnosis not present

## 2015-08-28 DIAGNOSIS — C8590 Non-Hodgkin lymphoma, unspecified, unspecified site: Secondary | ICD-10-CM | POA: Diagnosis not present

## 2015-08-28 DIAGNOSIS — Z510 Encounter for antineoplastic radiation therapy: Secondary | ICD-10-CM | POA: Diagnosis not present

## 2015-09-03 ENCOUNTER — Encounter: Payer: Self-pay | Admitting: Adult Health

## 2015-09-03 DIAGNOSIS — C61 Malignant neoplasm of prostate: Secondary | ICD-10-CM | POA: Insufficient documentation

## 2015-09-09 NOTE — Progress Notes (Signed)
  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[place])   Staging form: Lymphoid Neoplasms, AJCC 6th Edition     Clinical: Stage I - Signed by Ladell Pier, MD on 04/30/2014 Prostate cancer [facility])   Staging form: Prostate, AJCC 7th Edition     Pathologic stage from 10/29/2008: Stage IIB (T2c, N0, cM0) - Unsigned     Clinical: No stage assigned - Unsigned  Indication for treatment:  Curative       Radiation treatment dates:   08/08/2015-08/28/2015  Site/dose:   Right ear/ 30 Gy in 15 fractions  Beams/energy:   En face with 9 MeV electrons  Narrative: The patient tolerated radiation treatment relatively well.   He had minimal skin changes and little fatigue.   Plan: The patient has completed radiation treatment. The patient will return to radiation oncology clinic for routine followup in one month. I advised them to call or return sooner if they have any questions or concerns related to their recovery or treatment.  ------------------------------------------------  Thea Silversmith, MD

## 2015-09-11 NOTE — Progress Notes (Signed)
Fatigue:noneHearing: Skin: well healed, + Hyperpigmentation tight ear Appetite: too good Pain: NOne Swallowing Difficulty:None Weight:237.1lb BP 149/64 mmHg  Pulse 51  Temp(Src) 98 F (36.7 C) (Oral)  Resp 20  Ht 5' 11.5" (1.816 m)  Wt 237 lb 1.6 oz (107.548 kg)  BMI 32.61 kg/m2  SpO2 100%  Wt Readings from Last 3 Encounters:  09/12/15 237 lb 1.6 oz (107.548 kg)  08/21/15 239 lb 3.2 oz (108.5 kg)  08/13/15 239 lb 4.8 oz (108.546 kg)  1:08 PM

## 2015-09-12 ENCOUNTER — Encounter: Payer: Self-pay | Admitting: Radiation Oncology

## 2015-09-12 ENCOUNTER — Ambulatory Visit
Admission: RE | Admit: 2015-09-12 | Discharge: 2015-09-12 | Disposition: A | Discharge status: Home / Self Care | Payer: Medicare Other | Source: Ambulatory Visit | Attending: Radiation Oncology | Admitting: Radiation Oncology

## 2015-09-12 VITALS — BP 149/64 | HR 51 | Temp 98.0°F | Resp 20 | Ht 71.5 in | Wt 237.1 lb

## 2015-09-12 DIAGNOSIS — C884 Extranodal marginal zone B-cell lymphoma of mucosa-associated lymphoid tissue [MALT-lymphoma]: Secondary | ICD-10-CM

## 2015-09-12 NOTE — Progress Notes (Signed)
     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 He feels well, he has recovered well from radiation and with no ill effects. He has not seen Dr. Learta Codding He presents today for me to look at his lesion to see if he needs a few more fractions.   Fatigue:none Hearing: none  Skin: well healed, + Hyperpigmentation tight ear Appetite: too good Pain: None  Swallowing Difficulty:None Weight:237.1lb  Physical Exam:  Filed Vitals:   09/12/15 1305  BP: 149/64  Pulse: 51  Temp: 98 F (36.7 C)  TempSrc: Oral  Resp: 20  Height: 5' 11.5" (1.816 m)  Weight: 237 lb 1.6 oz (107.548 kg)  SpO2: 100%  Hyperpigmentation of the right ear. No palpaple abnormalities on his right ear, his scar is flat .  IMPRESSION: [name] is a 75 y.o. male who is recovering well from the effects of radiation.  PLAN: I think his skin looks great and he will not need additional radiation. He will continue to follow up with Dr.Sherrill.   ------------------------------------------------  Thea Silversmith, MD  This document serves as a record of services personally performed by Thea Silversmith, MD. It was created on her behalf by Derek Mound, a trained medical scribe. The creation of this record is based on the scribe's personal observations and the provider's statements  to them. This document has been checked and approved by the attending provider.

## 2015-10-15 DIAGNOSIS — I10 Essential (primary) hypertension: Secondary | ICD-10-CM | POA: Diagnosis not present

## 2015-10-15 DIAGNOSIS — E669 Obesity, unspecified: Secondary | ICD-10-CM | POA: Diagnosis not present

## 2015-10-15 DIAGNOSIS — Z79899 Other long term (current) drug therapy: Secondary | ICD-10-CM | POA: Diagnosis not present

## 2015-10-15 DIAGNOSIS — J309 Allergic rhinitis, unspecified: Secondary | ICD-10-CM | POA: Diagnosis not present

## 2015-10-15 DIAGNOSIS — C8590 Non-Hodgkin lymphoma, unspecified, unspecified site: Secondary | ICD-10-CM | POA: Diagnosis not present

## 2015-10-15 DIAGNOSIS — E7800 Pure hypercholesterolemia, unspecified: Secondary | ICD-10-CM | POA: Diagnosis not present

## 2015-10-15 DIAGNOSIS — Z7984 Long term (current) use of oral hypoglycemic drugs: Secondary | ICD-10-CM | POA: Diagnosis not present

## 2015-10-15 DIAGNOSIS — E119 Type 2 diabetes mellitus without complications: Secondary | ICD-10-CM | POA: Diagnosis not present

## 2015-10-15 DIAGNOSIS — Z6833 Body mass index (BMI) 33.0-33.9, adult: Secondary | ICD-10-CM | POA: Diagnosis not present

## 2015-10-25 DIAGNOSIS — H40053 Ocular hypertension, bilateral: Secondary | ICD-10-CM | POA: Diagnosis not present

## 2015-10-25 DIAGNOSIS — H25043 Posterior subcapsular polar age-related cataract, bilateral: Secondary | ICD-10-CM | POA: Diagnosis not present

## 2015-11-17 ENCOUNTER — Telehealth: Payer: Self-pay | Admitting: Oncology

## 2015-11-17 NOTE — Telephone Encounter (Signed)
s.w. pt wife and advised on 6.19 moved to 6.16...pt ok and aware

## 2015-11-18 DIAGNOSIS — Z8546 Personal history of malignant neoplasm of prostate: Secondary | ICD-10-CM | POA: Diagnosis not present

## 2015-11-25 DIAGNOSIS — Z85828 Personal history of other malignant neoplasm of skin: Secondary | ICD-10-CM | POA: Diagnosis not present

## 2015-11-25 DIAGNOSIS — L853 Xerosis cutis: Secondary | ICD-10-CM | POA: Diagnosis not present

## 2015-11-25 DIAGNOSIS — D2262 Melanocytic nevi of left upper limb, including shoulder: Secondary | ICD-10-CM | POA: Diagnosis not present

## 2015-11-25 DIAGNOSIS — D225 Melanocytic nevi of trunk: Secondary | ICD-10-CM | POA: Diagnosis not present

## 2015-11-25 DIAGNOSIS — L821 Other seborrheic keratosis: Secondary | ICD-10-CM | POA: Diagnosis not present

## 2015-11-25 DIAGNOSIS — L820 Inflamed seborrheic keratosis: Secondary | ICD-10-CM | POA: Diagnosis not present

## 2015-11-29 DIAGNOSIS — Z0000 Encounter for general adult medical examination without abnormal findings: Secondary | ICD-10-CM | POA: Diagnosis not present

## 2015-11-29 DIAGNOSIS — Z8546 Personal history of malignant neoplasm of prostate: Secondary | ICD-10-CM | POA: Diagnosis not present

## 2016-01-03 ENCOUNTER — Ambulatory Visit (HOSPITAL_BASED_OUTPATIENT_CLINIC_OR_DEPARTMENT_OTHER): Payer: Medicare Other | Admitting: Oncology

## 2016-01-03 ENCOUNTER — Telehealth: Payer: Self-pay | Admitting: Oncology

## 2016-01-03 VITALS — BP 138/63 | HR 54 | Temp 98.0°F | Resp 18 | Ht 71.5 in | Wt 240.2 lb

## 2016-01-03 DIAGNOSIS — Z8579 Personal history of other malignant neoplasms of lymphoid, hematopoietic and related tissues: Secondary | ICD-10-CM | POA: Diagnosis not present

## 2016-01-03 DIAGNOSIS — C884 Extranodal marginal zone B-cell lymphoma of mucosa-associated lymphoid tissue [MALT-lymphoma]: Secondary | ICD-10-CM

## 2016-01-03 DIAGNOSIS — Z8546 Personal history of malignant neoplasm of prostate: Secondary | ICD-10-CM

## 2016-01-03 NOTE — Progress Notes (Signed)
  [name] OFFICE PROGRESS NOTE   Diagnosis:  MALT lymphoma  INTERVAL HISTORY:   [name] returns as scheduled. He completed radiation to the right postauricular area on 08/28/2015. He reports the radiation hyperpigmentation has improved. No recurrent palpable abnormality in this area. No fever or night sweats. He reports sinus drainage for the past 6 months. He has a right sided "sinus "headache for the past week. He has been referred for an ENT evaluation.    Objective:  Vital signs in last 24 hours:  Blood pressure 138/63, pulse 54, temperature 98 F (36.7 C), temperature source Oral, resp. rate 18, height 5' 11.5" (1.816 m), weight 240 lb 3.2 oz (108.954 kg), SpO2 96 %.    HEENT: Oropharynx without visible mass, neck without mass, no sinus tenderness Lymphatics: No cervical, supraclavicular, axillary, or inguinal nodes Resp: Lungs with bronchial sounds on end inspiration at the upper posterior chest bilaterally Cardio: Regular rate and rhythm GI: No hepatosplenomegaly, no mass Vascular: No leg edema  Skin: Hyperpigmentation at the right ear, right post auricular area without a palpable mass     Lab Results:  Lab Results  Component Value Date   WBC 7.5 03/20/2015   HGB 13.7 03/20/2015   HCT 39.8 03/20/2015   MCV 89.8 03/20/2015   PLT 163 03/20/2015   NEUTROABS 4.4 04/30/2014     Medications: I have reviewed the patient's current medications.  Assessment/Plan: 1. 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 Prostate cancer diagnosed in 2009, status post prostatectomy  3. Diabetes  4. Hypertension  5. hyperlipidemia   Disposition:  [name] is in clinical remission from   non-Hodgkin's lymphoma. He saw Dr. Elvera Lennox last month and will see him again in 6 months. He will return for an office visit here in 9 months.  [name] is being referred to ENT to evaluate the sinus symptoms. I doubt the symptoms are related to the diagnosis of non-Hodgkin's lymphoma.  Betsy Coder, MD  01/03/2016  12:37 PM

## 2016-01-03 NOTE — Telephone Encounter (Signed)
Gave and printed appt sched and avs for pt for April 2018 °

## 2016-01-06 ENCOUNTER — Ambulatory Visit: Payer: Medicare Other | Admitting: Oncology

## 2016-02-04 DIAGNOSIS — G4733 Obstructive sleep apnea (adult) (pediatric): Secondary | ICD-10-CM | POA: Diagnosis not present

## 2016-02-04 DIAGNOSIS — J342 Deviated nasal septum: Secondary | ICD-10-CM | POA: Diagnosis not present

## 2016-02-04 DIAGNOSIS — J343 Hypertrophy of nasal turbinates: Secondary | ICD-10-CM | POA: Diagnosis not present

## 2016-02-04 DIAGNOSIS — J324 Chronic pansinusitis: Secondary | ICD-10-CM | POA: Diagnosis not present

## 2016-02-04 DIAGNOSIS — J321 Chronic frontal sinusitis: Secondary | ICD-10-CM | POA: Insufficient documentation

## 2016-03-17 DIAGNOSIS — G4733 Obstructive sleep apnea (adult) (pediatric): Secondary | ICD-10-CM | POA: Diagnosis not present

## 2016-03-17 DIAGNOSIS — J321 Chronic frontal sinusitis: Secondary | ICD-10-CM | POA: Diagnosis not present

## 2016-03-17 DIAGNOSIS — J342 Deviated nasal septum: Secondary | ICD-10-CM | POA: Diagnosis not present

## 2016-03-17 DIAGNOSIS — J324 Chronic pansinusitis: Secondary | ICD-10-CM | POA: Diagnosis not present

## 2016-03-17 DIAGNOSIS — J343 Hypertrophy of nasal turbinates: Secondary | ICD-10-CM | POA: Diagnosis not present

## 2016-03-26 ENCOUNTER — Other Ambulatory Visit: Payer: Self-pay | Admitting: Otolaryngology

## 2016-03-27 NOTE — Addendum Note (Signed)
Addended by: Wilburn Cornelia, Vieno Tarrant on: 03/27/2016 04:28 PM   Modules accepted: Orders

## 2016-03-30 ENCOUNTER — Encounter (HOSPITAL_COMMUNITY): Payer: Self-pay

## 2016-03-30 ENCOUNTER — Encounter (HOSPITAL_COMMUNITY)
Admission: RE | Admit: 2016-03-30 | Discharge: 2016-03-30 | Disposition: A | Discharge status: Home / Self Care | Payer: Medicare Other | Source: Ambulatory Visit | Attending: Otolaryngology | Admitting: Otolaryngology

## 2016-03-30 ENCOUNTER — Other Ambulatory Visit: Payer: Self-pay

## 2016-03-30 DIAGNOSIS — I10 Essential (primary) hypertension: Secondary | ICD-10-CM | POA: Diagnosis not present

## 2016-03-30 DIAGNOSIS — K219 Gastro-esophageal reflux disease without esophagitis: Secondary | ICD-10-CM | POA: Diagnosis not present

## 2016-03-30 DIAGNOSIS — E669 Obesity, unspecified: Secondary | ICD-10-CM | POA: Diagnosis not present

## 2016-03-30 DIAGNOSIS — Z87891 Personal history of nicotine dependence: Secondary | ICD-10-CM | POA: Diagnosis not present

## 2016-03-30 DIAGNOSIS — J988 Other specified respiratory disorders: Secondary | ICD-10-CM | POA: Diagnosis not present

## 2016-03-30 DIAGNOSIS — J343 Hypertrophy of nasal turbinates: Secondary | ICD-10-CM | POA: Diagnosis not present

## 2016-03-30 DIAGNOSIS — Z6833 Body mass index (BMI) 33.0-33.9, adult: Secondary | ICD-10-CM | POA: Diagnosis not present

## 2016-03-30 DIAGNOSIS — J329 Chronic sinusitis, unspecified: Secondary | ICD-10-CM | POA: Diagnosis not present

## 2016-03-30 DIAGNOSIS — G4730 Sleep apnea, unspecified: Secondary | ICD-10-CM | POA: Diagnosis not present

## 2016-03-30 DIAGNOSIS — Z79899 Other long term (current) drug therapy: Secondary | ICD-10-CM | POA: Diagnosis not present

## 2016-03-30 DIAGNOSIS — Z7984 Long term (current) use of oral hypoglycemic drugs: Secondary | ICD-10-CM | POA: Diagnosis not present

## 2016-03-30 DIAGNOSIS — E119 Type 2 diabetes mellitus without complications: Secondary | ICD-10-CM | POA: Diagnosis not present

## 2016-03-30 DIAGNOSIS — J342 Deviated nasal septum: Secondary | ICD-10-CM | POA: Diagnosis not present

## 2016-03-30 HISTORY — DX: Pneumonia, unspecified organism: J18.9

## 2016-03-30 HISTORY — DX: Deviated nasal septum: J34.2

## 2016-03-30 HISTORY — DX: Presence of spectacles and contact lenses: Z97.3

## 2016-03-30 LAB — BASIC METABOLIC PANEL
ANION GAP: 7 (ref 5–15)
BUN: 14 mg/dL (ref 6–20)
CHLORIDE: 103 mmol/L (ref 101–111)
CO2: 26 mmol/L (ref 22–32)
CREATININE: 1.19 mg/dL (ref 0.61–1.24)
Calcium: 9.5 mg/dL (ref 8.9–10.3)
GFR calc non Af Amer: 58 mL/min — ABNORMAL LOW (ref >60)
GLUCOSE: 208 mg/dL — AB (ref 65–99)
Potassium: 4.1 mmol/L (ref 3.5–5.1)
Sodium: 136 mmol/L (ref 135–145)

## 2016-03-30 LAB — CBC
HCT: 41.1 % (ref 39.0–52.0)
HEMOGLOBIN: 14.1 g/dL (ref 13.0–17.0)
MCH: 30.7 pg (ref 26.0–34.0)
MCHC: 34.3 g/dL (ref 30.0–36.0)
MCV: 89.3 fL (ref 78.0–100.0)
Platelets: 154 10*3/uL (ref 150–400)
RBC: 4.6 MIL/uL (ref 4.22–5.81)
RDW: 14.1 % (ref 11.5–15.5)
WBC: 7.2 10*3/uL (ref 4.0–10.5)

## 2016-03-30 LAB — GLUCOSE, CAPILLARY: Glucose-Capillary: 212 mg/dL — ABNORMAL HIGH (ref 65–99)

## 2016-03-30 NOTE — Progress Notes (Signed)
Pt denies SOB, chest pain, and being under the care of a cardiologist. Pt denies having an echo and cardiac cath but stated that a stress test was performed > 10 years ago.  Pt PCP is Dr. Lajean Manes. Pt stated that he does not own a glucometer and does not check his blood glucose. Pt denies having any recent labs or an A1c within the last 2 months. Pt denies having a chest x ray and EKG within the last year. Pt chart forwarded to anesthesia to review EKG. Support staff that performed EKG, denies pt C/O SOB, chest pain or being lightheaded during and after EKG.

## 2016-03-30 NOTE — Pre-Procedure Instructions (Addendum)
LAWERANCE GUTRIDGE  03/30/2016      [id number] [place] [address] [facility] [address] [place] [address] Phone: [phone] Fax: [phone]    Your procedure is scheduled on Wednesday, April 01, 2016  Report to [facility] Admitting at 6:30 A.M.  Call this number if you have problems the morning of surgery:  [phone]   Remember:  Do not eat food or drink liquids after midnight Tuesday, March 31, 2016  Take these medicines the morning of surgery with A SIP OF WATER : Amlodipine ( Norvasc),  Atenolol     (Tenormin),  if needed: Pantoprazole ( Protonix), for heartburn, Tramadol ( Ultram) for pain, Ocean nasal spray for congestion Stop taking Aspirin, vitamins, fish oil, and herbal medications such as Melatonin . Do not take any NSAIDs ie: Ibuprofen, Advil, Naproxen, BC and Goody Powder or any medication containing Aspirin; stop now.    How to Manage Your Diabetes Before and After Surgery  Why is it important to control my blood sugar before and after surgery? . Improving blood sugar levels before and after surgery helps healing and can limit problems. . A way of improving blood sugar control is eating a healthy diet by: o  Eating less sugar and carbohydrates o  Increasing activity/exercise o  Talking with your doctor about reaching your blood sugar goals . High blood sugars (greater than 180 mg/dL) can raise your risk of infections and slow your recovery, so you will need to focus on controlling your diabetes during the weeks before surgery. . Make sure that the doctor who takes care of your diabetes knows about your planned surgery including the date and location.  How do I manage my blood sugar before surgery? . Check your blood sugar at least 4 times a day, starting 2 days before surgery, to make sure that the level is not too high or low. o Check your blood sugar the morning of your surgery  when you wake up and every 2 hours until you get to the Short Stay unit. . If your blood sugar is less than 70 mg/dL, you will need to treat for low blood sugar: o Do not take insulin. o Treat a low blood sugar (less than 70 mg/dL) with  cup of clear juice (cranberry or apple), 4 glucose tablets, OR glucose gel. o Recheck blood sugar in 15 minutes after treatment (to make sure it is greater than 70 mg/dL). If your blood sugar is not greater than 70 mg/dL on recheck, call [phone] for further instructions. . Report your blood sugar to the short stay nurse when you get to Short Stay.  . If you are admitted to the hospital after surgery: o Your blood sugar will be checked by the staff and you will probably be given insulin after surgery (instead of oral diabetes medicines) to make sure you have good blood sugar levels. o The goal for blood sugar control after surgery is 80-180 mg/dL.  WHAT DO I DO ABOUT MY DIABETES MEDICATION?   Marland Kitchen Do not take oral diabetes medicines (pills) the morning of surgery such as Metformin ( Glucophage).  Patient Signature:  Date:   Nurse Signature:  Date:   Reviewed and Endorsed by [facility] Patient Education Committee, August 2015  Do not wear jewelry, make-up or nail polish.  Do not wear lotions, powders, or perfumes, or deoderant.  Do not shave 48 hours prior to  surgery.  Men may shave face and neck.  Do not bring valuables to the hospital.  [facility] is not responsible for any belongings or valuables.  Contacts, dentures or bridgework may not be worn into surgery.  Leave your suitcase in the car.  After surgery it may be brought to your room.  For patients admitted to the hospital, discharge time will be determined by your treatment team.  Patients discharged the day of surgery will not be allowed to drive home.   Name and phone number of your driver:  Special instructions:  [facility] - Preparing for Surgery  Before surgery, you can play an  important role.  Because skin is not sterile, your skin needs to be as free of germs as possible.  You can reduce the number of germs on you skin by washing with CHG (chlorahexidine gluconate) soap before surgery.  CHG is an antiseptic cleaner which kills germs and bonds with the skin to continue killing germs even after washing.  Please DO NOT use if you have an allergy to CHG or antibacterial soaps.  If your skin becomes reddened/irritated stop using the CHG and inform your nurse when you arrive at Short Stay.  Do not shave (including legs and underarms) for at least 48 hours prior to the first CHG shower.  You may shave your face.  Please follow these instructions carefully:   1.  Shower with CHG Soap the night before surgery and the morning of Surgery.  2.  If you choose to wash your hair, wash your hair first as usual with your normal shampoo.  3.  After you shampoo, rinse your hair and body thoroughly to remove the Shampoo.  4.  Use CHG as you would any other liquid soap.  You can apply chg directly  to the skin and wash gently with scrungie or a clean washcloth.  5.  Apply the CHG Soap to your body ONLY FROM THE NECK DOWN.  Do not use on open wounds or open sores.  Avoid contact with your eyes, ears, mouth and genitals (private parts).  Wash genitals (private parts) with your normal soap.  6.  Wash thoroughly, paying special attention to the area where your surgery will be performed.  7.  Thoroughly rinse your body with warm water from the neck down.  8.  DO NOT shower/wash with your normal soap after using and rinsing off the CHG Soap.  9.  Pat yourself dry with a clean towel.            10.  Wear clean pajamas.            11.  Place clean sheets on your bed the night of your first shower and do not sleep with pets.  Day of Surgery  Do not apply any lotions/deoderants the morning of surgery.  Please wear clean clothes to the hospital/surgery center.  Please read over the following  fact sheets that you were given. Pain Booklet, Coughing and Deep Breathing and Surgical Site Infection Prevention

## 2016-03-31 LAB — HEMOGLOBIN A1C
Hgb A1c MFr Bld: 8.2 % — ABNORMAL HIGH (ref 4.8–5.6)
Mean Plasma Glucose: 189 mg/dL

## 2016-03-31 MED ORDER — DEXAMETHASONE SODIUM PHOSPHATE 10 MG/ML IJ SOLN
10.0000 mg | INTRAMUSCULAR | Status: DC
  Filled 2016-03-31: qty 1

## 2016-03-31 NOTE — Progress Notes (Signed)
Anesthesia Note: Pre-operative chart reviewed. Labs acceptable, although DM2 not optimally controlled with A1c 8.2. EKG showed SB. HR on arrival 93. He will get vitals and fasting CBG on arrival. If results acceptable and otherwise no acute changes then I anticipate that he can proceed as planned.  George Hugh [facility] Short Stay Center/Anesthesiology Phone [phone] 03/31/2016 12:59 PM

## 2016-04-01 ENCOUNTER — Encounter (HOSPITAL_COMMUNITY): Payer: Self-pay | Admitting: Certified Registered Nurse Anesthetist

## 2016-04-01 ENCOUNTER — Encounter (HOSPITAL_COMMUNITY)
Admission: RE | Disposition: A | Discharge status: Home / Self Care | Payer: Self-pay | Source: Ambulatory Visit | Attending: Otolaryngology

## 2016-04-01 ENCOUNTER — Ambulatory Visit (HOSPITAL_COMMUNITY): Payer: Medicare Other | Admitting: Certified Registered Nurse Anesthetist

## 2016-04-01 ENCOUNTER — Ambulatory Visit (HOSPITAL_COMMUNITY)
Admission: RE | Admit: 2016-04-01 | Discharge: 2016-04-01 | Disposition: A | Discharge status: Home / Self Care | Payer: Medicare Other | Source: Ambulatory Visit | Attending: Otolaryngology | Admitting: Otolaryngology

## 2016-04-01 DIAGNOSIS — J343 Hypertrophy of nasal turbinates: Secondary | ICD-10-CM | POA: Diagnosis not present

## 2016-04-01 DIAGNOSIS — J342 Deviated nasal septum: Secondary | ICD-10-CM | POA: Diagnosis present

## 2016-04-01 DIAGNOSIS — I10 Essential (primary) hypertension: Secondary | ICD-10-CM | POA: Insufficient documentation

## 2016-04-01 DIAGNOSIS — Z87891 Personal history of nicotine dependence: Secondary | ICD-10-CM | POA: Insufficient documentation

## 2016-04-01 DIAGNOSIS — G4730 Sleep apnea, unspecified: Secondary | ICD-10-CM | POA: Insufficient documentation

## 2016-04-01 DIAGNOSIS — E119 Type 2 diabetes mellitus without complications: Secondary | ICD-10-CM | POA: Insufficient documentation

## 2016-04-01 DIAGNOSIS — K219 Gastro-esophageal reflux disease without esophagitis: Secondary | ICD-10-CM | POA: Insufficient documentation

## 2016-04-01 DIAGNOSIS — E669 Obesity, unspecified: Secondary | ICD-10-CM | POA: Diagnosis not present

## 2016-04-01 DIAGNOSIS — J0110 Acute frontal sinusitis, unspecified: Secondary | ICD-10-CM | POA: Diagnosis not present

## 2016-04-01 DIAGNOSIS — Z7984 Long term (current) use of oral hypoglycemic drugs: Secondary | ICD-10-CM | POA: Diagnosis not present

## 2016-04-01 DIAGNOSIS — J988 Other specified respiratory disorders: Secondary | ICD-10-CM | POA: Diagnosis not present

## 2016-04-01 DIAGNOSIS — J329 Chronic sinusitis, unspecified: Secondary | ICD-10-CM | POA: Diagnosis not present

## 2016-04-01 DIAGNOSIS — J322 Chronic ethmoidal sinusitis: Secondary | ICD-10-CM | POA: Diagnosis not present

## 2016-04-01 DIAGNOSIS — J321 Chronic frontal sinusitis: Secondary | ICD-10-CM | POA: Diagnosis not present

## 2016-04-01 DIAGNOSIS — Z79899 Other long term (current) drug therapy: Secondary | ICD-10-CM | POA: Insufficient documentation

## 2016-04-01 DIAGNOSIS — J320 Chronic maxillary sinusitis: Secondary | ICD-10-CM | POA: Diagnosis not present

## 2016-04-01 DIAGNOSIS — Z6833 Body mass index (BMI) 33.0-33.9, adult: Secondary | ICD-10-CM | POA: Insufficient documentation

## 2016-04-01 HISTORY — PX: SINUS ENDO WITH FUSION: SHX5329

## 2016-04-01 HISTORY — PX: NASAL SEPTOPLASTY W/ TURBINOPLASTY: SHX2070

## 2016-04-01 LAB — GLUCOSE, CAPILLARY
GLUCOSE-CAPILLARY: 184 mg/dL — AB (ref 65–99)
Glucose-Capillary: 244 mg/dL — ABNORMAL HIGH (ref 65–99)

## 2016-04-01 SURGERY — SEPTOPLASTY, NOSE, WITH NASAL TURBINATE REDUCTION
Anesthesia: General | Laterality: Right

## 2016-04-01 MED ORDER — LIDOCAINE-EPINEPHRINE 1 %-1:100000 IJ SOLN
INTRAMUSCULAR | Status: DC | PRN
  Administered 2016-04-01: 7 mL

## 2016-04-01 MED ORDER — FENTANYL CITRATE (PF) 100 MCG/2ML IJ SOLN
INTRAMUSCULAR | Status: AC
  Filled 2016-04-01: qty 4

## 2016-04-01 MED ORDER — ARTIFICIAL TEARS OP OINT
TOPICAL_OINTMENT | OPHTHALMIC | Status: DC | PRN
  Administered 2016-04-01: 1 via OPHTHALMIC

## 2016-04-01 MED ORDER — GLYCOPYRROLATE 0.2 MG/ML IJ SOLN
INTRAMUSCULAR | Status: DC | PRN
  Administered 2016-04-01: 0.2 mg via INTRAVENOUS

## 2016-04-01 MED ORDER — PHENYLEPHRINE HCL 10 MG/ML IJ SOLN
INTRAVENOUS | Status: DC | PRN
  Administered 2016-04-01: 25 ug/min via INTRAVENOUS

## 2016-04-01 MED ORDER — SODIUM CHLORIDE 0.9 % IR SOLN
Status: DC | PRN
  Administered 2016-04-01: 1000 mL

## 2016-04-01 MED ORDER — DEXAMETHASONE SODIUM PHOSPHATE 10 MG/ML IJ SOLN
INTRAMUSCULAR | Status: AC
  Filled 2016-04-01: qty 1

## 2016-04-01 MED ORDER — ROCURONIUM BROMIDE 100 MG/10ML IV SOLN
INTRAVENOUS | Status: DC | PRN
  Administered 2016-04-01: 50 mg via INTRAVENOUS

## 2016-04-01 MED ORDER — MUPIROCIN CALCIUM 2 % EX CREA
TOPICAL_CREAM | CUTANEOUS | Status: AC
  Filled 2016-04-01: qty 15

## 2016-04-01 MED ORDER — FENTANYL CITRATE (PF) 100 MCG/2ML IJ SOLN
INTRAMUSCULAR | Status: DC | PRN
  Administered 2016-04-01: 50 ug via INTRAVENOUS
  Administered 2016-04-01: 100 ug via INTRAVENOUS
  Administered 2016-04-01: 50 ug via INTRAVENOUS

## 2016-04-01 MED ORDER — OXYCODONE HCL 5 MG PO TABS: 5.0000 mg | ORAL_TABLET | Freq: Once | ORAL | Status: DC | PRN

## 2016-04-01 MED ORDER — GLYCOPYRROLATE 0.2 MG/ML IV SOSY
PREFILLED_SYRINGE | INTRAVENOUS | Status: AC
Start: 2016-04-01 — End: 2016-04-01
  Filled 2016-04-01: qty 3

## 2016-04-01 MED ORDER — LACTATED RINGERS IV SOLN
INTRAVENOUS | Status: DC | PRN
  Administered 2016-04-01 (×2): via INTRAVENOUS

## 2016-04-01 MED ORDER — DEXAMETHASONE SODIUM PHOSPHATE 10 MG/ML IJ SOLN
INTRAMUSCULAR | Status: DC | PRN
  Administered 2016-04-01: 10 mg via INTRAVENOUS

## 2016-04-01 MED ORDER — PROPOFOL 10 MG/ML IV BOLUS
INTRAVENOUS | Status: AC
  Filled 2016-04-01: qty 20

## 2016-04-01 MED ORDER — ONDANSETRON HCL 4 MG/2ML IJ SOLN
INTRAMUSCULAR | Status: AC
  Filled 2016-04-01: qty 2

## 2016-04-01 MED ORDER — OXYMETAZOLINE HCL 0.05 % NA SOLN
NASAL | Status: DC | PRN
  Administered 2016-04-01: 1 via TOPICAL

## 2016-04-01 MED ORDER — MIDAZOLAM HCL 2 MG/2ML IJ SOLN
INTRAMUSCULAR | Status: DC | PRN
  Administered 2016-04-01 (×2): 1 mg via INTRAVENOUS

## 2016-04-01 MED ORDER — LIDOCAINE-EPINEPHRINE 1 %-1:100000 IJ SOLN
INTRAMUSCULAR | Status: AC
  Filled 2016-04-01: qty 1

## 2016-04-01 MED ORDER — OXYMETAZOLINE HCL 0.05 % NA SOLN
NASAL | Status: AC
  Filled 2016-04-01: qty 15

## 2016-04-01 MED ORDER — CEFAZOLIN SODIUM-DEXTROSE 2-3 GM-% IV SOLR
INTRAVENOUS | Status: DC | PRN
  Administered 2016-04-01: 2 g via INTRAVENOUS

## 2016-04-01 MED ORDER — ROCURONIUM BROMIDE 10 MG/ML (PF) SYRINGE
PREFILLED_SYRINGE | INTRAVENOUS | Status: AC
Start: 2016-04-01 — End: 2016-04-01
  Filled 2016-04-01: qty 10

## 2016-04-01 MED ORDER — ONDANSETRON HCL 4 MG/2ML IJ SOLN
INTRAMUSCULAR | Status: DC | PRN
  Administered 2016-04-01: 4 mg via INTRAVENOUS

## 2016-04-01 MED ORDER — ONDANSETRON HCL 4 MG/2ML IJ SOLN: 4.0000 mg | Freq: Four times a day (QID) | INTRAMUSCULAR | Status: DC | PRN

## 2016-04-01 MED ORDER — LIDOCAINE 2% (20 MG/ML) 5 ML SYRINGE
INTRAMUSCULAR | Status: AC
  Filled 2016-04-01: qty 5

## 2016-04-01 MED ORDER — SUGAMMADEX SODIUM 200 MG/2ML IV SOLN
INTRAVENOUS | Status: AC
  Filled 2016-04-01: qty 2

## 2016-04-01 MED ORDER — TRIAMCINOLONE ACETONIDE 40 MG/ML IJ SUSP
INTRAMUSCULAR | Status: AC
  Filled 2016-04-01: qty 5

## 2016-04-01 MED ORDER — HYDROMORPHONE HCL 1 MG/ML IJ SOLN
INTRAMUSCULAR | Status: AC
  Filled 2016-04-01: qty 1

## 2016-04-01 MED ORDER — OXYCODONE HCL 5 MG/5ML PO SOLN: 5.0000 mg | Freq: Once | ORAL | Status: DC | PRN

## 2016-04-01 MED ORDER — PROPOFOL 10 MG/ML IV BOLUS
INTRAVENOUS | Status: DC | PRN
  Administered 2016-04-01: 200 mg via INTRAVENOUS

## 2016-04-01 MED ORDER — CEFAZOLIN SODIUM-DEXTROSE 2-4 GM/100ML-% IV SOLN
INTRAVENOUS | Status: AC
  Filled 2016-04-01: qty 100

## 2016-04-01 MED ORDER — 0.9 % SODIUM CHLORIDE (POUR BTL) OPTIME
TOPICAL | Status: DC | PRN
  Administered 2016-04-01: 1000 mL

## 2016-04-01 MED ORDER — MUPIROCIN CALCIUM 2 % EX CREA
TOPICAL_CREAM | CUTANEOUS | Status: DC | PRN
  Administered 2016-04-01: 1 via TOPICAL

## 2016-04-01 MED ORDER — LIDOCAINE HCL (CARDIAC) 20 MG/ML IV SOLN
INTRAVENOUS | Status: DC | PRN
  Administered 2016-04-01: 60 mg via INTRATRACHEAL

## 2016-04-01 MED ORDER — HYDROCODONE-ACETAMINOPHEN 5-325 MG PO TABS: 1.0000 | ORAL_TABLET | Freq: Four times a day (QID) | ORAL | 0 refills | Status: AC | PRN

## 2016-04-01 MED ORDER — MIDAZOLAM HCL 2 MG/2ML IJ SOLN
INTRAMUSCULAR | Status: AC
  Filled 2016-04-01: qty 2

## 2016-04-01 MED ORDER — SUGAMMADEX SODIUM 200 MG/2ML IV SOLN
INTRAVENOUS | Status: DC | PRN
  Administered 2016-04-01: 200 mg via INTRAVENOUS

## 2016-04-01 MED ORDER — AMOXICILLIN-POT CLAVULANATE 500-125 MG PO TABS: 1.0000 | ORAL_TABLET | Freq: Two times a day (BID) | ORAL | 0 refills | Status: DC

## 2016-04-01 MED ORDER — HYDROMORPHONE HCL 1 MG/ML IJ SOLN
0.2500 mg | INTRAMUSCULAR | Status: DC | PRN
  Administered 2016-04-01 (×2): 0.5 mg via INTRAVENOUS

## 2016-04-01 MED ORDER — ARTIFICIAL TEARS OP OINT
TOPICAL_OINTMENT | OPHTHALMIC | Status: AC
  Filled 2016-04-01: qty 3.5

## 2016-04-01 SURGICAL SUPPLY — 42 items
BLADE ROTATE RAD 40 4 M4 (BLADE) ×1 IMPLANT
BLADE ROTATE RAD 40 4MM M4 (BLADE) ×1
BLADE SURG 15 STRL LF DISP TIS (BLADE) ×2 IMPLANT
BLADE SURG 15 STRL SS (BLADE) ×4
CANISTER SUCTION 2500CC (MISCELLANEOUS) ×8 IMPLANT
ELECT COATED BLADE 2.86 ST (ELECTRODE) IMPLANT
ELECT REM PT RETURN 9FT ADLT (ELECTROSURGICAL) ×4
ELECTRODE REM PT RTRN 9FT ADLT (ELECTROSURGICAL) ×2 IMPLANT
FILTER ARTHROSCOPY CONVERTOR (FILTER) ×6 IMPLANT
FLUID NSS /IRRIG 1000 ML XXX (MISCELLANEOUS) ×4 IMPLANT
GAUZE SPONGE 2X2 8PLY STRL LF (GAUZE/BANDAGES/DRESSINGS) ×2 IMPLANT
GLOVE BIOGEL M 7.0 STRL (GLOVE) ×8 IMPLANT
GLOVE BIOGEL PI IND STRL 7.0 (GLOVE) IMPLANT
GLOVE BIOGEL PI INDICATOR 7.0 (GLOVE) ×2
GLOVE SURG SS PI 7.0 STRL IVOR (GLOVE) ×2 IMPLANT
GOWN STRL REUS W/ TWL LRG LVL3 (GOWN DISPOSABLE) ×4 IMPLANT
GOWN STRL REUS W/TWL LRG LVL3 (GOWN DISPOSABLE) ×8
KIT BASIN OR (CUSTOM PROCEDURE TRAY) ×4 IMPLANT
KIT ROOM TURNOVER OR (KITS) ×4 IMPLANT
NDL HYPO 25GX1X1/2 BEV (NEEDLE) ×2 IMPLANT
NEEDLE HYPO 25GX1X1/2 BEV (NEEDLE) ×4 IMPLANT
NS IRRIG 1000ML POUR BTL (IV SOLUTION) ×4 IMPLANT
PAD ARMBOARD 7.5X6 YLW CONV (MISCELLANEOUS) ×8 IMPLANT
PAD ENT ADHESIVE 25PK (MISCELLANEOUS) ×4 IMPLANT
PENCIL BUTTON HOLSTER BLD 10FT (ELECTRODE) ×2 IMPLANT
SPECIMEN JAR SMALL (MISCELLANEOUS) ×4 IMPLANT
SPLINT NASAL DOYLE BI-VL (GAUZE/BANDAGES/DRESSINGS) ×4 IMPLANT
SPONGE GAUZE 2X2 STER 10/PKG (GAUZE/BANDAGES/DRESSINGS)
SPONGE NEURO XRAY DETECT 1X3 (DISPOSABLE) ×4 IMPLANT
SUT ETHILON 3 0 PS 1 (SUTURE) ×4 IMPLANT
SUT PLAIN 4 0 ~~LOC~~ 1 (SUTURE) ×4 IMPLANT
SYR CONTROL 10ML LL (SYRINGE) ×4 IMPLANT
TOWEL OR 17X24 6PK STRL BLUE (TOWEL DISPOSABLE) ×4 IMPLANT
TRACKER ENT INSTRUMENT (MISCELLANEOUS) ×4 IMPLANT
TRACKER ENT PATIENT (MISCELLANEOUS) ×4 IMPLANT
TRAY ENT MC OR (CUSTOM PROCEDURE TRAY) ×4 IMPLANT
TUBE CONNECTING 12'X1/4 (SUCTIONS) ×1
TUBE CONNECTING 12X1/4 (SUCTIONS) ×3 IMPLANT
TUBE SALEM SUMP 16 FR W/ARV (TUBING) ×4 IMPLANT
TUBING EXTENTION W/L.L. (IV SETS) ×4 IMPLANT
TUBING STRAIGHTSHOT EPS 5PK (TUBING) ×4 IMPLANT
WIPE INSTRUMENT VISIWIPE 73X73 (MISCELLANEOUS) ×4 IMPLANT

## 2016-04-01 NOTE — Progress Notes (Signed)
Report given to sookie rn as caregiver 

## 2016-04-01 NOTE — Transfer of Care (Signed)
Immediate Anesthesia Transfer of Care Note  Patient: [name]  Procedure(s) Performed: Procedure(s): NASAL SEPTOPLASTY WITH BILATERAL INFERIOR  TURBINATE REDUCTION (Bilateral) RIGHT ENDOSCOPIC SINUS SURGERY,AND CONSISTING  OF ANTERIOR ETHMOIDECTOMY (Right)  Patient Location: PACU  Anesthesia Type:General  Level of Consciousness: awake, alert , oriented and patient cooperative  Airway & Oxygen Therapy: Patient Spontanous Breathing and Patient connected to face mask oxygen  Post-op Assessment: Report given to RN, Post -op Vital signs reviewed and stable, Patient moving all extremities X 4 and Patient able to stick tongue midline  Post vital signs: Reviewed and stable  Last Vitals:  Vitals:   04/01/16 0655  BP: (!) 148/65  Pulse: (!) 49  Resp: 20  Temp: 37 C    Last Pain:  Vitals:   04/01/16 0655  TempSrc: Oral         Complications: No apparent anesthesia complications

## 2016-04-01 NOTE — Anesthesia Procedure Notes (Signed)
Procedure Name: Awake intubation Date/Time: 04/01/2016 8:44 AM Performed by: Marcie Bal, ADAM Pre-anesthesia Checklist: Patient identified, Emergency Drugs available, Suction available and Patient being monitored Patient Re-evaluated:Patient Re-evaluated prior to inductionOxygen Delivery Method: Circle system utilized Preoxygenation: Pre-oxygenation with 100% oxygen Intubation Type: IV induction Ventilation: Oral airway inserted - appropriate to patient size Laryngoscope Size: Mac, 3 and Glidescope (T4) Grade View: Grade IV Tube type: Oral Tube size: 7.5 mm Number of attempts: 2 (x1 with blade, then with glidescope) Airway Equipment and Method: Stylet and Video-laryngoscopy Placement Confirmation: ETT inserted through vocal cords under direct vision,  positive ETCO2 and breath sounds checked- equal and bilateral Secured at: 23 cm Tube secured with: Tape Dental Injury: Teeth and Oropharynx as per pre-operative assessment  Difficulty Due To: Difficulty was unanticipated

## 2016-04-01 NOTE — Anesthesia Preprocedure Evaluation (Signed)
Anesthesia Evaluation  Patient identified by MRN, date of birth, ID band Patient awake    Reviewed: Allergy & Precautions, H&P , NPO status , Patient's Chart, lab work & pertinent test results  Airway Mallampati: II   Neck ROM: full    Dental   Pulmonary sleep apnea , former smoker,    breath sounds clear to auscultation       Cardiovascular hypertension,  Rhythm:regular Rate:Normal     Neuro/Psych    GI/Hepatic GERD  ,  Endo/Other  diabetes, Type 2obese  Renal/GU      Musculoskeletal  (+) Arthritis ,   Abdominal   Peds  Hematology   Anesthesia Other Findings   Reproductive/Obstetrics                             Anesthesia Physical Anesthesia Plan  ASA: III  Anesthesia Plan: General   Post-op Pain Management:    Induction: Intravenous  Airway Management Planned: Oral ETT  Additional Equipment:   Intra-op Plan:   Post-operative Plan: Extubation in OR  Informed Consent: I have reviewed the patients History and Physical, chart, labs and discussed the procedure including the risks, benefits and alternatives for the proposed anesthesia with the patient or authorized representative who has indicated his/her understanding and acceptance.     Plan Discussed with: CRNA, Anesthesiologist and Surgeon  Anesthesia Plan Comments:         Anesthesia Quick Evaluation

## 2016-04-01 NOTE — H&P (Signed)
[name] is an 75 y.o. male.   Chief Complaint: Nasal obstruction HPI: Prog nasal airway obstruction  Past Medical History:  Diagnosis Date  . Acute meniscal tear of right knee   . Arthritis   . Cancer [facility]) 2015   lymphoma new dx area removed behind right ear  . Cancer [facility]) 2010   prostate  . Deviated septum    chronic sinusitis, nasal turbinate hypertrophy  . Diabetes mellitus without complication ([place])    on metformin  . GERD (gastroesophageal reflux disease)   . Hypertension   . Pneumonia   . Sleep apnea    wears CPAP  . Wears glasses     Past Surgical History:  Procedure Laterality Date  . back injection     to lower back  . BACK SURGERY     lower back  . KNEE ARTHROSCOPY Right 03/27/2015   Procedure: RIGHT ARTHROSCOPY KNEE WITH DEBRIDEMENT, PARTIAL LATERAL MENISCECTOMY;  Surgeon: Dakota Arabian, MD;  Location: [facility] ORS;  Service: Orthopedics;  Laterality: Right;  . PROSTATECTOMY  2010  . surgery for collapsed lung Left 35 to 40 years ago    Family History  Problem Relation Age of Onset  . Heart attack Father   . Heart attack Sister    Social History:  reports that he has quit smoking. His smoking use included Cigarettes. He has a 20.00 pack-year smoking history. He has never used smokeless tobacco. He reports that he does not drink alcohol or use drugs.  Allergies:  Allergies  Allergen Reactions  . No Known Allergies     Medications Prior to Admission  Medication Sig Dispense Refill  . amLODipine (NORVASC) 10 MG tablet Take 10 mg by mouth every morning.     Marland Kitchen aspirin 81 MG tablet Take 81 mg by mouth every morning.     Marland Kitchen atenolol (TENORMIN) 50 MG tablet Take 25 mg by mouth every morning.     . fluticasone (FLONASE) 50 MCG/ACT nasal spray Place 1 spray into both nostrils at bedtime.     . hydrochlorothiazide (HYDRODIURIL) 25 MG tablet Take 12.5 mg by mouth every morning.     . latanoprost (XALATAN) 0.005 % ophthalmic solution Place 1 drop into both  eyes at bedtime.  11  . lisinopril (PRINIVIL,ZESTRIL) 20 MG tablet Take 20 mg by mouth every morning.     . Melatonin 3 MG TABS Take 1 tablet by mouth at bedtime as needed.    . metFORMIN (GLUCOPHAGE) 500 MG tablet Take 500 mg by mouth every morning. Reported on 07/24/2015    . pantoprazole (PROTONIX) 40 MG tablet Take 40 mg by mouth daily as needed (heart burn). Reported on 09/12/2015  6  . rosuvastatin (CRESTOR) 10 MG tablet Take 10 mg by mouth at bedtime.    . sodium chloride (OCEAN) 0.65 % SOLN nasal spray Place 1 spray into both nostrils as needed for congestion.    . traMADol (ULTRAM) 50 MG tablet Take 50 mg by mouth every 6 (six) hours as needed for moderate pain. Reported on 09/12/2015  0    Results for orders placed or performed during the hospital encounter of 04/01/16 (from the past 48 hour(s))  Glucose, capillary     Status: Abnormal   Collection Time: 04/01/16  6:43 AM  Result Value Ref Range   Glucose-Capillary 184 (H) 65 - 99 mg/dL   No results found.  Review of Systems  Constitutional: Negative.   HENT: Negative.   Respiratory: Negative.  Cardiovascular: Negative.     Blood pressure (!) 148/65, pulse (!) 49, temperature 98.6 F (37 C), temperature source Oral, resp. rate 20, height 5\' 11"  (1.803 m), weight 109.1 kg (240 lb 8 oz), SpO2 98 %. Physical Exam  Constitutional: He is oriented to person, place, and time. He appears well-developed.  HENT:  Deviated septum and IT hypertrophy  Neck: Normal range of motion. Neck supple.  Cardiovascular: Normal rate.   Respiratory: Effort normal.  Musculoskeletal: Normal range of motion.  Neurological: He is alert and oriented to person, place, and time.     Assessment/Plan Adm for OP Sino-nasal surgery under GA  Dakota Tappan, MD 04/01/2016, 8:31 AM

## 2016-04-01 NOTE — Brief Op Note (Signed)
04/01/2016  10:22 AM  PATIENT:  Willeen Niece  75 y.o. 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  PROCEDURE:  Procedure(s): NASAL SEPTOPLASTY WITH BILATERAL INFERIOR  TURBINATE REDUCTION (Bilateral) RIGHT ENDOSCOPIC SINUS SURGERY,AND CONSISTING  OF ANTERIOR ETHMOIDECTOMY (Right), MAXILLARY ANTROSTOMY AND NASOFRONTAL RECESS EXPLORATION  SURGEON:  Surgeon(s) and Role:    * Jerrell Belfast, MD - Primary  PHYSICIAN ASSISTANT:   ASSISTANTS: none   ANESTHESIA:   general  EBL:  Total I/O In: 1300 [I.V.:1300] Out: 50 [Blood:50]  100 cc  BLOOD ADMINISTERED:none  DRAINS: none   LOCAL MEDICATIONS USED:  LIDOCAINE  and Amount: 7 ml  SPECIMEN:  Source of Specimen:  Right Sinus contents  DISPOSITION OF SPECIMEN:  PATHOLOGY  COUNTS:  YES  TOURNIQUET:  * No tourniquets in log *  DICTATION: .Other Dictation: Dictation Number [id number]  PLAN OF CARE: Discharge to home after PACU  PATIENT DISPOSITION:  PACU - hemodynamically stable.   Delay start of Pharmacological VTE agent (>24hrs) due to surgical blood loss or risk of bleeding: not applicable

## 2016-04-01 NOTE — Op Note (Signed)
NAMEMarland Kitchen  LOCH, IRIBE           ACCOUNT NO.:  [id number]  MEDICAL RECORD NO.:  [id number]  LOCATION:  [facility]                         FACILITY:  [facility]  PHYSICIAN:  Early Chars. Wilburn Cornelia, M.D.DATE OF BIRTH:  1941-03-26  DATE OF PROCEDURE:  04/01/2016 DATE OF DISCHARGE:                              OPERATIVE REPORT   PREOPERATIVE DIAGNOSES: 1. Severe nasal septal deviation with airway obstruction. 2. Bilateral inferior turbinate hypertrophy. 3. Chronic right sinusitis.  POSTOPERATIVE DIAGNOSES: 1. Severe nasal septal deviation with airway obstruction. 2. Bilateral inferior turbinate hypertrophy. 3. Chronic right sinusitis.  INDICATION FOR SURGERY: 1. Severe nasal septal deviation with airway obstruction. 2. Bilateral inferior turbinate hypertrophy. 3. Chronic right sinusitis.  SURGICAL PROCEDURE: 1. Nasal septoplasty. 2. Right endoscopic sinus surgery with intraoperative computer-     assisted navigation consisting of anterior ethmoidectomy, maxillary     antrostomy, and nasal frontal recess exploration. 3. Bilateral inferior turbinate reduction.  ANESTHESIA:  General endotracheal.  COMPLICATIONS:  None.  ESTIMATED BLOOD LOSS:  Less than 100 mL.  The patient transferred from the operating room to the recovery room in stable condition.  FINDINGS:  Severely deviated nasal septum with airway obstruction and bilateral inferior turbinate hypertrophy.  Moderate mucosal disease involving the anterior ethmoid and frontal sinuses on the right.  No packing was placed.  Bilateral Doyle nasal septal splints were placed at the conclusion of the surgical procedure.  BRIEF HISTORY:  The patient is a 75 year old black male who was referred to our office with a history of progressive nasal airway obstruction. The patient has longstanding sleep apnea and has successfully wore a nasal CPAP.  Over the last several years, he has had increasing difficulty wearing a CPAP because of increasing  nasal blockage. Examination in the office showed a severely deviated nasal septum with greater than 90% airway obstruction and bilateral turbinate hypertrophy. The patient has a history of chronic postnasal discharge and periorbital headaches.  A CT scan was obtained which showed chronic appearing sinus changes on the right-hand side involving the anterior ethmoid, maxillary, and frontal sinuses.  Given the patient's history and findings, I recommended the above surgical procedures.  The risks and benefits were discussed in detail with the patient and his wife and they understood and agreed with our plan for surgery which is scheduled on elective basis at [facility] on April 01, 2016.  DESCRIPTION OF PROCEDURE:  The patient was brought to the operating room, placed in supine position on the operating table.  General endotracheal anesthesia was established without difficulty.  When the patient was adequately anesthetized, he was positioned on the operating table and prepped and draped in a sterile fashion.  A surgical time-out was then performed for correct identification of the patient and the surgical procedure including the right laterality of the sinus component of the operation.  The patient's nose was then injected with 7 mL of 1% lidocaine with 1:100,000 dilution epinephrine which was injected in a submucosal fashion along the nasal septum and inferior turbinates bilaterally as well as the right lateral nasal wall and middle turbinate.  The patient's nose was then packed with Afrin-soaked cottonoid pledgets which were left in place for approximately 10 minutes  to allow for vasoconstriction and hemostasis.  The Xomed fusion navigation head gear was applied and anatomic and surgical landmarks were identified and confirmed.  The navigation tool was used throughout the sinus component of the surgical procedure.  With the patient prepped and draped and prepared for  surgery, a nasal septoplasty was performed.  A left anterior hemitransfixion incision was created.  A mucoperichondrial flap was elevated from anterior to posterior on the left-hand side.  The patient had a very large bony septal spur which was mobilized with a 4-mm osteotome preserving the overlying mucosa.  The anterior cartilaginous septum was crossed and mucoperichondrial flap was elevated on the patient's right.  Anterior septal cartilage was removed, this was later morselized and returned to the mucoperichondrial pocket.  Dissection was then carried from anterior to posterior removing deviated bone and cartilage and creating a midline nasal septum.  At the conclusion of the septal component of the surgical procedure, nasal septum was reconstructed by replacing the anterior septal cartilage which was morselized.  The mucosal flaps were reapproximated with 4-0 gut suture on a Keith needle in a horizontal mattressing fashion and on the conclusion of the entire surgical procedure, bilateral Doyle nasal septal splints were placed after the application of Bactroban ointment and sutured in position with a 3-0 Ethilon suture.  Endoscopic sinus surgery was then begun with the septum brought to midline position, the right nasal passageway could be adequately visualized, began with a 0-degree endoscope.  The middle turbinate was identified and carefully medialized.  The uncinate process reflected anteriorly and resected with Thru-Cutting forceps and a microdebrider. An anterior ethmoidectomy was then performed.  The ethmoid bulla was identified and dissection was carried from inferior to superior resecting the ethmoid bulla as well as diseased hypertrophied mucosa. Dissection was taken to the nasofrontal recess and a 45-degree telescope and a curved microdebrider were then used to enlarge the nasal frontal recess laterally in a supra ethmoid bullar cell.  The sinuses were opacified and thick  mucosa was resected with the microdebrider creating a widely patent nasofrontal recess.  Attention was then turned to the lateral nasal wall where residual uncinate process was resected.  The natural ostium in the maxillary sinus was identified and then was enlarged in a posterior and inferior direction creating a widely patent right maxillary ostium.  There were no infection within the sinus and no polyp material.  The inferior turbinate reduction was then performed with bipolar cautery set at 12 watts.  Two submucosal passes were made in each inferior turbinate.  Small anterior incisions were created overlying soft tissue, elevated and the turbinate bone was resected.  The turbinates were then outfractured creating a more patent nasal cavity.  No nasal packing was placed.  Bilateral Doyle nasal septal splints were placed at the conclusion of the surgical procedure.  The patient's nasal cavity was irrigated and suctioned.  There was no active bleeding.  An orogastric tube was passed.  Stomach contents were aspirated.  The patient was then awakened from his anesthetic.  He was extubated and was transferred safely from the operating room to the recovery room in stable condition.  No complications.  Estimated blood loss approximately 100 mL.          ______________________________ Early Chars. Wilburn Cornelia, M.D.     DLS/MEDQ  D:  [id number]  T:  04/01/2016  Job:  [id number]

## 2016-04-01 NOTE — Anesthesia Postprocedure Evaluation (Signed)
Anesthesia Post Note  Patient: [name]  Procedure(s) Performed: Procedure(s) (LRB): NASAL SEPTOPLASTY WITH BILATERAL INFERIOR  TURBINATE REDUCTION (Bilateral) RIGHT ENDOSCOPIC SINUS SURGERY,AND CONSISTING  OF ANTERIOR ETHMOIDECTOMY (Right)  Patient location during evaluation: PACU Anesthesia Type: General Level of consciousness: awake and alert Pain management: pain level controlled Vital Signs Assessment: post-procedure vital signs reviewed and stable Respiratory status: spontaneous breathing, nonlabored ventilation, respiratory function stable and patient connected to nasal cannula oxygen Cardiovascular status: blood pressure returned to baseline and stable Postop Assessment: no signs of nausea or vomiting Anesthetic complications: no    Last Vitals:  Vitals:   04/01/16 1100 04/01/16 1106  BP: (!) 146/79 137/73  Pulse: (!) 57 62  Resp: 15 15  Temp:      Last Pain:  Vitals:   04/01/16 1106  TempSrc:   PainSc: 5                  Zenaida Deed

## 2016-04-02 ENCOUNTER — Encounter (HOSPITAL_COMMUNITY): Payer: Self-pay | Admitting: Otolaryngology

## 2016-04-24 DIAGNOSIS — H40052 Ocular hypertension, left eye: Secondary | ICD-10-CM | POA: Diagnosis not present

## 2016-04-24 DIAGNOSIS — H40051 Ocular hypertension, right eye: Secondary | ICD-10-CM | POA: Diagnosis not present

## 2016-05-19 DIAGNOSIS — M5412 Radiculopathy, cervical region: Secondary | ICD-10-CM | POA: Diagnosis not present

## 2016-05-19 DIAGNOSIS — I10 Essential (primary) hypertension: Secondary | ICD-10-CM | POA: Diagnosis not present

## 2016-05-19 DIAGNOSIS — M9981 Other biomechanical lesions of cervical region: Secondary | ICD-10-CM | POA: Diagnosis not present

## 2016-05-26 DIAGNOSIS — I10 Essential (primary) hypertension: Secondary | ICD-10-CM | POA: Diagnosis not present

## 2016-05-26 DIAGNOSIS — M5412 Radiculopathy, cervical region: Secondary | ICD-10-CM | POA: Diagnosis not present

## 2016-05-26 DIAGNOSIS — M9981 Other biomechanical lesions of cervical region: Secondary | ICD-10-CM | POA: Diagnosis not present

## 2016-06-02 DIAGNOSIS — D2239 Melanocytic nevi of other parts of face: Secondary | ICD-10-CM | POA: Diagnosis not present

## 2016-06-02 DIAGNOSIS — Z85828 Personal history of other malignant neoplasm of skin: Secondary | ICD-10-CM | POA: Diagnosis not present

## 2016-06-02 DIAGNOSIS — D1801 Hemangioma of skin and subcutaneous tissue: Secondary | ICD-10-CM | POA: Diagnosis not present

## 2016-06-02 DIAGNOSIS — D225 Melanocytic nevi of trunk: Secondary | ICD-10-CM | POA: Diagnosis not present

## 2016-06-02 DIAGNOSIS — D2271 Melanocytic nevi of right lower limb, including hip: Secondary | ICD-10-CM | POA: Diagnosis not present

## 2016-06-02 DIAGNOSIS — L821 Other seborrheic keratosis: Secondary | ICD-10-CM | POA: Diagnosis not present

## 2016-06-30 ENCOUNTER — Ambulatory Visit: Payer: Medicare Other | Admitting: Oncology

## 2016-07-16 DIAGNOSIS — E669 Obesity, unspecified: Secondary | ICD-10-CM | POA: Diagnosis not present

## 2016-07-16 DIAGNOSIS — E119 Type 2 diabetes mellitus without complications: Secondary | ICD-10-CM | POA: Diagnosis not present

## 2016-07-16 DIAGNOSIS — I10 Essential (primary) hypertension: Secondary | ICD-10-CM | POA: Diagnosis not present

## 2016-07-16 DIAGNOSIS — D696 Thrombocytopenia, unspecified: Secondary | ICD-10-CM | POA: Diagnosis not present

## 2016-07-16 DIAGNOSIS — Z1389 Encounter for screening for other disorder: Secondary | ICD-10-CM | POA: Diagnosis not present

## 2016-07-16 DIAGNOSIS — E7800 Pure hypercholesterolemia, unspecified: Secondary | ICD-10-CM | POA: Diagnosis not present

## 2016-07-16 DIAGNOSIS — Z6832 Body mass index (BMI) 32.0-32.9, adult: Secondary | ICD-10-CM | POA: Diagnosis not present

## 2016-07-16 DIAGNOSIS — Z79899 Other long term (current) drug therapy: Secondary | ICD-10-CM | POA: Diagnosis not present

## 2016-07-16 DIAGNOSIS — Z23 Encounter for immunization: Secondary | ICD-10-CM | POA: Diagnosis not present

## 2016-07-16 DIAGNOSIS — Z0000 Encounter for general adult medical examination without abnormal findings: Secondary | ICD-10-CM | POA: Diagnosis not present

## 2016-07-16 DIAGNOSIS — Z7984 Long term (current) use of oral hypoglycemic drugs: Secondary | ICD-10-CM | POA: Diagnosis not present

## 2016-07-29 DIAGNOSIS — Z6833 Body mass index (BMI) 33.0-33.9, adult: Secondary | ICD-10-CM | POA: Diagnosis not present

## 2016-07-29 DIAGNOSIS — M5412 Radiculopathy, cervical region: Secondary | ICD-10-CM | POA: Diagnosis not present

## 2016-07-29 DIAGNOSIS — I10 Essential (primary) hypertension: Secondary | ICD-10-CM | POA: Diagnosis not present

## 2016-09-16 DIAGNOSIS — E1165 Type 2 diabetes mellitus with hyperglycemia: Secondary | ICD-10-CM | POA: Diagnosis not present

## 2016-09-16 DIAGNOSIS — Z7984 Long term (current) use of oral hypoglycemic drugs: Secondary | ICD-10-CM | POA: Diagnosis not present

## 2016-10-15 DIAGNOSIS — B029 Zoster without complications: Secondary | ICD-10-CM | POA: Diagnosis not present

## 2016-10-19 DIAGNOSIS — B0239 Other herpes zoster eye disease: Secondary | ICD-10-CM | POA: Diagnosis not present

## 2016-10-29 ENCOUNTER — Telehealth: Payer: Self-pay | Admitting: Oncology

## 2016-10-29 ENCOUNTER — Ambulatory Visit (HOSPITAL_BASED_OUTPATIENT_CLINIC_OR_DEPARTMENT_OTHER): Payer: Medicare Other | Admitting: Oncology

## 2016-10-29 VITALS — BP 143/73 | HR 52 | Temp 98.7°F | Resp 17 | Ht 71.0 in | Wt 232.2 lb

## 2016-10-29 DIAGNOSIS — Z8572 Personal history of non-Hodgkin lymphomas: Secondary | ICD-10-CM | POA: Diagnosis not present

## 2016-10-29 DIAGNOSIS — Z8546 Personal history of malignant neoplasm of prostate: Secondary | ICD-10-CM | POA: Diagnosis not present

## 2016-10-29 DIAGNOSIS — C884 Extranodal marginal zone B-cell lymphoma of mucosa-associated lymphoid tissue [MALT-lymphoma]: Secondary | ICD-10-CM

## 2016-10-29 NOTE — Telephone Encounter (Signed)
Appointments scheduled per 4.12.18 LOS. Patient given AVS report and calendars with future scheduled appointments. °

## 2016-10-29 NOTE — Progress Notes (Signed)
  [name] OFFICE PROGRESS NOTE   Diagnosis: Non-Hodgkin's lymphoma  INTERVAL HISTORY:   [name] returns as scheduled. He feels well. No palpable abnormality at the right post auricular area. He was recently diagnosed with "shingles "at the right face. This has improved. No other complaint.  Objective:  Vital signs in last 24 hours:  Blood pressure (!) 143/73, pulse (!) 52, temperature 98.7 F (37.1 C), temperature source Oral, resp. rate 17, height 5\' 11"  (1.803 m), weight 232 lb 3.2 oz (105.3 kg), SpO2 100 %.    HEENT: Neck without mass, right post auricular area without evidence of recurrent tumor Lymphatics: No cervical, supra-clavicular, axillary, or inguinal nodes Resp: Lungs clear bilaterally Cardio: Regular rate and rhythm GI: No hepatosplenomegaly, no mass Vascular: No leg edema  Skin: Healing zoster rash at the right forehead and right periorbital region      Medications: I have reviewed the patient's current medications.  Assessment/Plan: 1. 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 Prostate cancer diagnosed in 2009, status post prostatectomy  3. Diabetes  4. Hypertension  5. hyperlipidemiq  6.  Zoster rash right forehead/periorbital region April 2018    Disposition:  [name] remains in clinical remission from non-Hodgkin's lymphoma. He will return for an office visit in one year. We will see him in the interim as needed. He continues follow-up with Dr. Elvera Lennox.  15 minutes were spent with the patient today. The majority of the time was used for counseling and coordination of care.  Betsy Coder, MD  10/29/2016  12:11 PM

## 2016-11-05 ENCOUNTER — Ambulatory Visit
Admission: RE | Admit: 2016-11-05 | Discharge: 2016-11-05 | Disposition: A | Discharge status: Home / Self Care | Payer: Medicare Other | Source: Ambulatory Visit | Attending: Internal Medicine | Admitting: Internal Medicine

## 2016-11-05 ENCOUNTER — Other Ambulatory Visit: Payer: Self-pay | Admitting: Internal Medicine

## 2016-11-05 DIAGNOSIS — R0789 Other chest pain: Secondary | ICD-10-CM | POA: Diagnosis not present

## 2016-11-06 DIAGNOSIS — B0239 Other herpes zoster eye disease: Secondary | ICD-10-CM | POA: Diagnosis not present

## 2016-11-09 DIAGNOSIS — Z7984 Long term (current) use of oral hypoglycemic drugs: Secondary | ICD-10-CM | POA: Diagnosis not present

## 2016-11-09 DIAGNOSIS — E1165 Type 2 diabetes mellitus with hyperglycemia: Secondary | ICD-10-CM | POA: Diagnosis not present

## 2016-12-04 DIAGNOSIS — H2513 Age-related nuclear cataract, bilateral: Secondary | ICD-10-CM | POA: Diagnosis not present

## 2016-12-04 DIAGNOSIS — B0239 Other herpes zoster eye disease: Secondary | ICD-10-CM | POA: Diagnosis not present

## 2017-01-13 DIAGNOSIS — E119 Type 2 diabetes mellitus without complications: Secondary | ICD-10-CM | POA: Diagnosis not present

## 2017-01-13 DIAGNOSIS — H40053 Ocular hypertension, bilateral: Secondary | ICD-10-CM | POA: Diagnosis not present

## 2017-01-13 DIAGNOSIS — H25813 Combined forms of age-related cataract, bilateral: Secondary | ICD-10-CM | POA: Diagnosis not present

## 2017-01-13 DIAGNOSIS — H524 Presbyopia: Secondary | ICD-10-CM | POA: Diagnosis not present

## 2017-01-15 DIAGNOSIS — E119 Type 2 diabetes mellitus without complications: Secondary | ICD-10-CM | POA: Diagnosis not present

## 2017-01-15 DIAGNOSIS — I10 Essential (primary) hypertension: Secondary | ICD-10-CM | POA: Diagnosis not present

## 2017-01-15 DIAGNOSIS — Z6831 Body mass index (BMI) 31.0-31.9, adult: Secondary | ICD-10-CM | POA: Diagnosis not present

## 2017-01-15 DIAGNOSIS — E669 Obesity, unspecified: Secondary | ICD-10-CM | POA: Diagnosis not present

## 2017-01-15 DIAGNOSIS — H538 Other visual disturbances: Secondary | ICD-10-CM | POA: Diagnosis not present

## 2017-02-01 DIAGNOSIS — N5201 Erectile dysfunction due to arterial insufficiency: Secondary | ICD-10-CM | POA: Diagnosis not present

## 2017-02-01 DIAGNOSIS — Z8546 Personal history of malignant neoplasm of prostate: Secondary | ICD-10-CM | POA: Diagnosis not present

## 2017-02-05 DIAGNOSIS — Z79899 Other long term (current) drug therapy: Secondary | ICD-10-CM | POA: Diagnosis not present

## 2017-02-05 DIAGNOSIS — I10 Essential (primary) hypertension: Secondary | ICD-10-CM | POA: Diagnosis not present

## 2017-02-10 DIAGNOSIS — H524 Presbyopia: Secondary | ICD-10-CM | POA: Diagnosis not present

## 2017-02-10 DIAGNOSIS — H25813 Combined forms of age-related cataract, bilateral: Secondary | ICD-10-CM | POA: Diagnosis not present

## 2017-02-10 DIAGNOSIS — H40053 Ocular hypertension, bilateral: Secondary | ICD-10-CM | POA: Diagnosis not present

## 2017-02-10 DIAGNOSIS — H52203 Unspecified astigmatism, bilateral: Secondary | ICD-10-CM | POA: Diagnosis not present

## 2017-02-23 DIAGNOSIS — H25811 Combined forms of age-related cataract, right eye: Secondary | ICD-10-CM | POA: Diagnosis not present

## 2017-02-23 DIAGNOSIS — H268 Other specified cataract: Secondary | ICD-10-CM | POA: Diagnosis not present

## 2017-03-05 DIAGNOSIS — M25562 Pain in left knee: Secondary | ICD-10-CM | POA: Diagnosis not present

## 2017-03-05 IMAGING — MR MR CERVICAL SPINE W/O CM
4 of 5 series · 24 of 48 positions shown · non-contrast
Comparison: None.

CLINICAL DATA: Left arm pain, weakness, and burning. Numbness in
the thumb and index finger of the left hand. Symptoms for 45 months.

EXAM:
MRI CERVICAL SPINE WITHOUT CONTRAST
TECHNIQUE: Multiplanar, multisequence MR imaging of the cervical spine was
performed. No intravenous contrast was administered.

[Series 3: T2 · sagittal · 3.0mm · 0.41mm/px · 6 of 13 slices shown (1 of 2)]
[im 1/13]
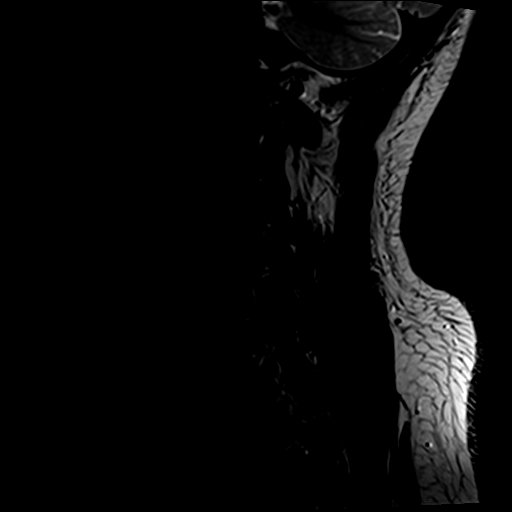
[im 3/13]
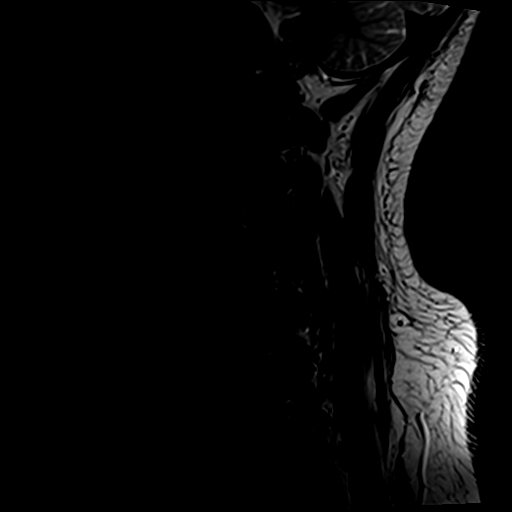
[im 5/13]
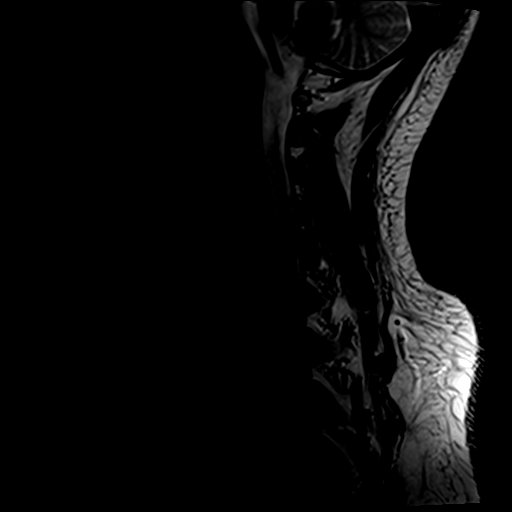
[im 8/13]
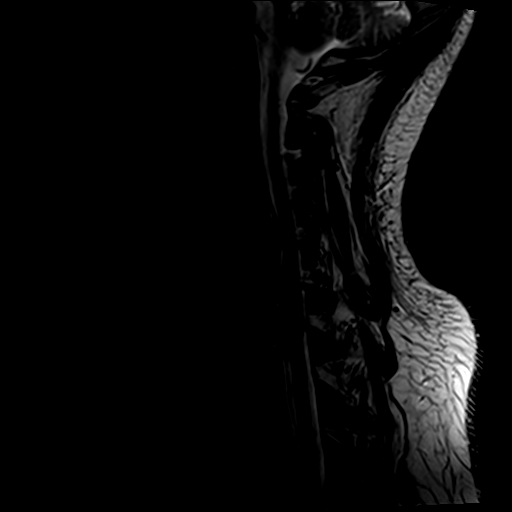
[im 10/13]
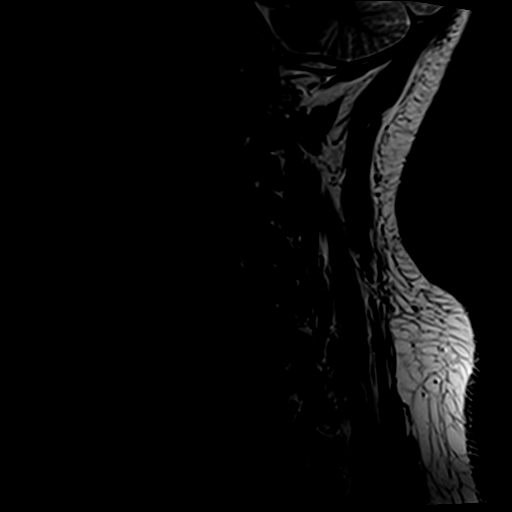
[im 13/13]
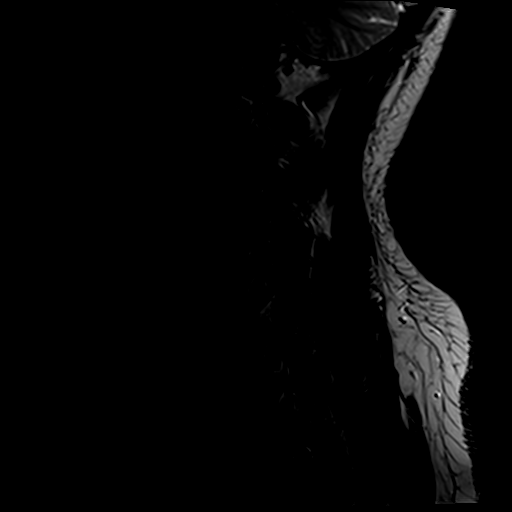

[Series 4: T1 · sagittal · 3.0mm · 0.41mm/px · 7 of 13 slices shown]
[im 1/13]
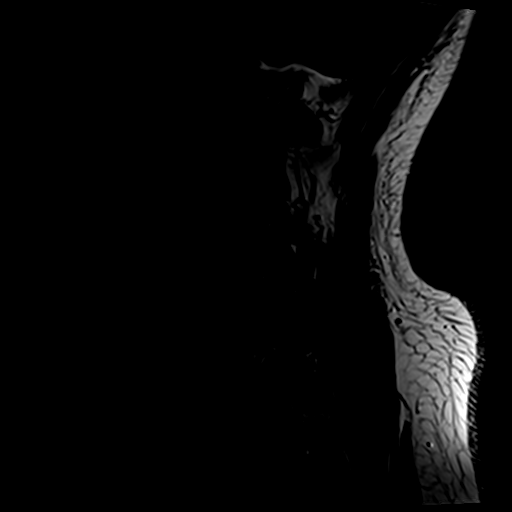
[im 3/13]
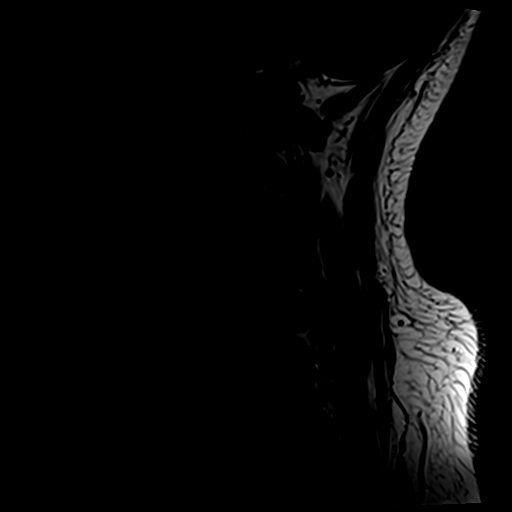
[im 5/13]
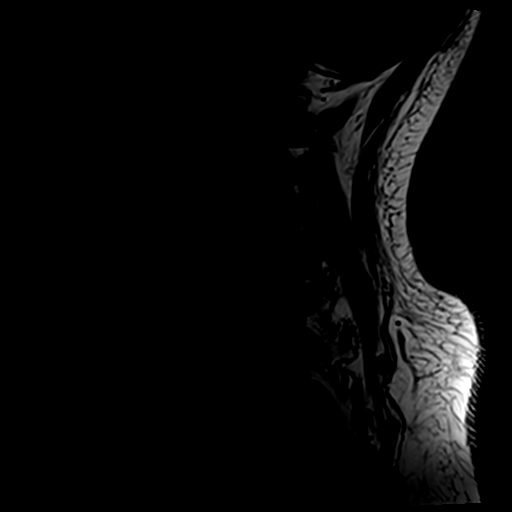
[im 7/13]
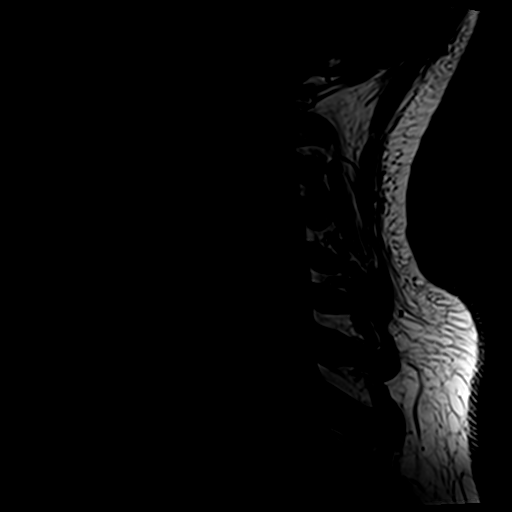
[im 9/13]
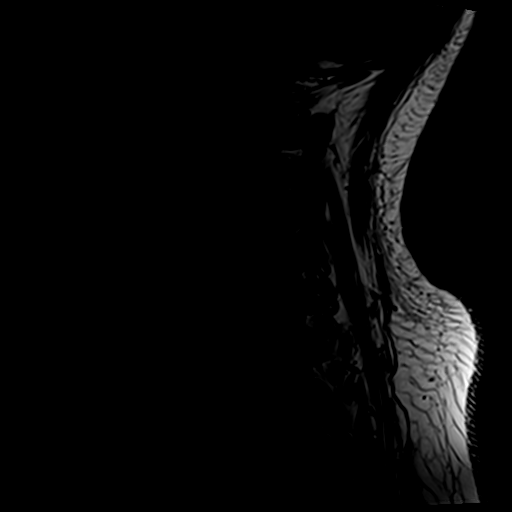
[im 11/13]
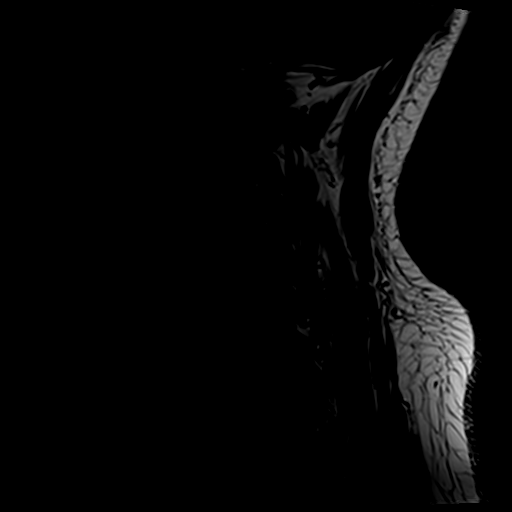
[im 13/13]
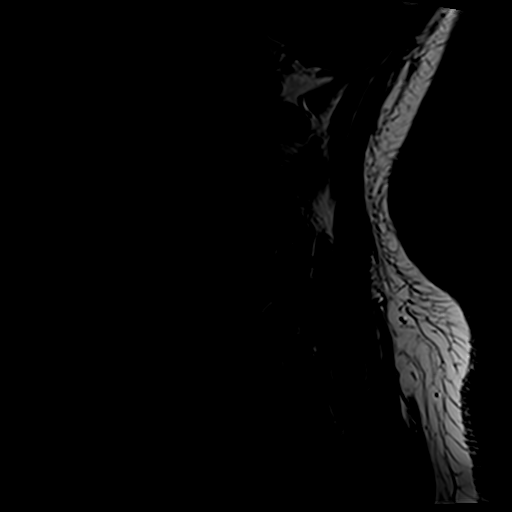

[Series 5: tir sag · sagittal · 3.0mm · 0.41mm/px · 3 of 13 slices shown]
[im 3/13]
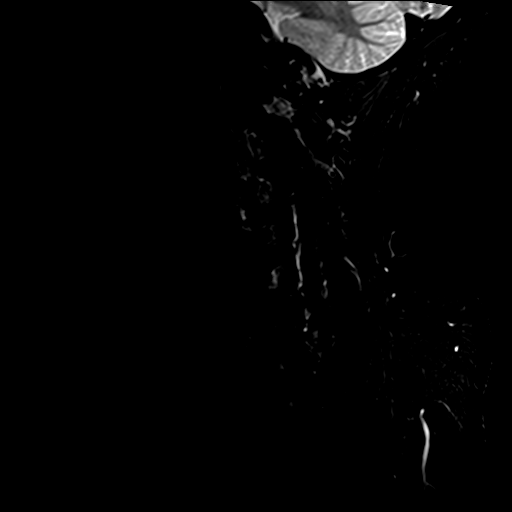
[im 7/13]
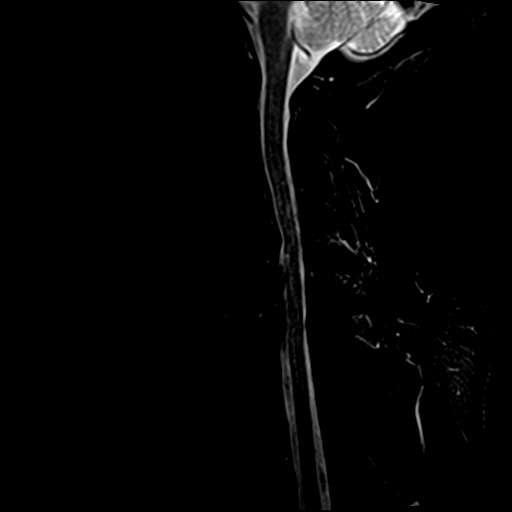
[im 11/13]
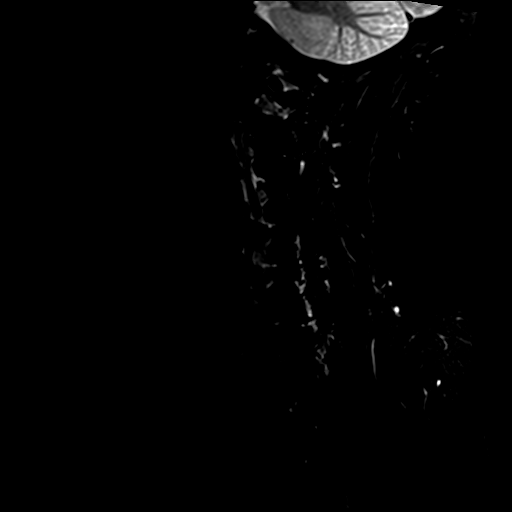

[Series 7: T2 · axial · 3.0mm · 0.70mm/px · z∈[-42,+58]mm · 8 of 28 slices shown (2 of 2)]
[im 1/28]
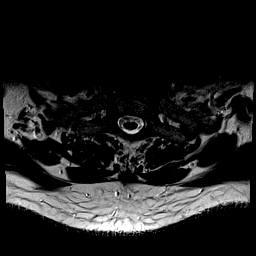
[im 5/28]
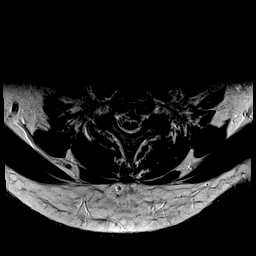
[im 9/28]
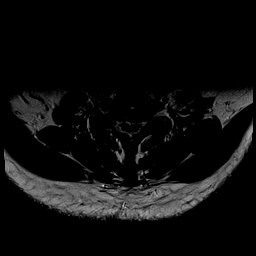
[im 13/28]
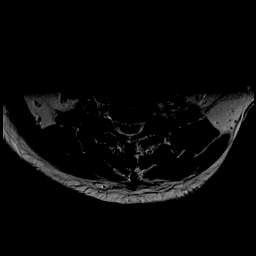
[im 15/28]
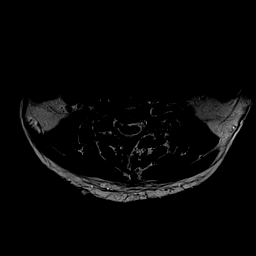
[im 19/28]
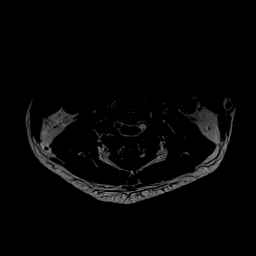
[im 23/28]
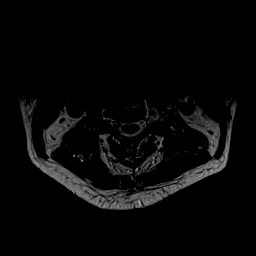
[im 28/28]
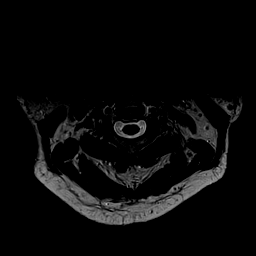

[24 of 48 positions shown; findings below may reference images not displayed]

FINDINGS: Normal signal is present in the cervical and upper thoracic spinal
cord to the lowest imaged level, T2-3. There straightening of the
normal cervical lordosis. Chronic endplate marrow changes are
present at C4-5 and C6-7.

The craniocervical junction is within normal limits. The visualized
intracranial contents are normal.

Flow is present in the vertebral arteries.

C2-3:  Negative.

C3-4: A shallow central disc protrusion is present. There is partial
effacement of ventral CSF. The foramina are patent.

C4-5: A leftward disc osteophyte complex is present. Prominent
left-sided constant spurring results in moderate to severe left and
mild right foraminal narrowing. Mild to moderate central canal
stenosis is present.

C5-6: With a broad-based disc osteophyte complex is asymmetric to
the left. There is partial effacement of ventral CSF. Facet
hypertrophy contributes to moderate left and mild right foraminal
narrowing.

C6-7: A broad-based disc osteophyte complex is present.
Uncovertebral spurring is noted bilaterally is effacement of ventral
CSF. Moderate foraminal stenosis is worse right than left.

C7-T1: Mild facet hypertrophy is present without significant
stenosis.
IMPRESSION: 1. Moderate to severe left foraminal stenosis at C4-5.
2. Moderate left foraminal stenosis at C5-6.
3. Moderate foraminal narrowing bilaterally at C6-7 is worse on the
right.
4. Mild right foraminal narrowing at C4-5 and C5-6.
5. Mild to moderate central canal stenosis at C4-5, C5-6, and C6-7.

## 2017-03-18 DIAGNOSIS — E1165 Type 2 diabetes mellitus with hyperglycemia: Secondary | ICD-10-CM | POA: Diagnosis not present

## 2017-03-18 DIAGNOSIS — I10 Essential (primary) hypertension: Secondary | ICD-10-CM | POA: Diagnosis not present

## 2017-03-18 DIAGNOSIS — C61 Malignant neoplasm of prostate: Secondary | ICD-10-CM | POA: Diagnosis not present

## 2017-03-23 DIAGNOSIS — H25812 Combined forms of age-related cataract, left eye: Secondary | ICD-10-CM | POA: Diagnosis not present

## 2017-03-23 DIAGNOSIS — H268 Other specified cataract: Secondary | ICD-10-CM | POA: Diagnosis not present

## 2017-06-02 DIAGNOSIS — L821 Other seborrheic keratosis: Secondary | ICD-10-CM | POA: Diagnosis not present

## 2017-06-02 DIAGNOSIS — D225 Melanocytic nevi of trunk: Secondary | ICD-10-CM | POA: Diagnosis not present

## 2017-06-02 DIAGNOSIS — L918 Other hypertrophic disorders of the skin: Secondary | ICD-10-CM | POA: Diagnosis not present

## 2017-06-02 DIAGNOSIS — D1801 Hemangioma of skin and subcutaneous tissue: Secondary | ICD-10-CM | POA: Diagnosis not present

## 2017-06-02 DIAGNOSIS — B353 Tinea pedis: Secondary | ICD-10-CM | POA: Diagnosis not present

## 2017-06-02 DIAGNOSIS — Z85828 Personal history of other malignant neoplasm of skin: Secondary | ICD-10-CM | POA: Diagnosis not present

## 2017-07-07 DIAGNOSIS — H40012 Open angle with borderline findings, low risk, left eye: Secondary | ICD-10-CM | POA: Diagnosis not present

## 2017-07-07 DIAGNOSIS — H40053 Ocular hypertension, bilateral: Secondary | ICD-10-CM | POA: Diagnosis not present

## 2017-07-19 DIAGNOSIS — Z0000 Encounter for general adult medical examination without abnormal findings: Secondary | ICD-10-CM | POA: Diagnosis not present

## 2017-07-19 DIAGNOSIS — Z1211 Encounter for screening for malignant neoplasm of colon: Secondary | ICD-10-CM | POA: Diagnosis not present

## 2017-07-19 DIAGNOSIS — Z23 Encounter for immunization: Secondary | ICD-10-CM | POA: Diagnosis not present

## 2017-07-19 DIAGNOSIS — Z1389 Encounter for screening for other disorder: Secondary | ICD-10-CM | POA: Diagnosis not present

## 2017-07-20 HISTORY — PX: EYE SURGERY: SHX253

## 2017-08-11 DIAGNOSIS — E119 Type 2 diabetes mellitus without complications: Secondary | ICD-10-CM | POA: Diagnosis not present

## 2017-08-11 DIAGNOSIS — Z79899 Other long term (current) drug therapy: Secondary | ICD-10-CM | POA: Diagnosis not present

## 2017-08-11 DIAGNOSIS — Z6831 Body mass index (BMI) 31.0-31.9, adult: Secondary | ICD-10-CM | POA: Diagnosis not present

## 2017-08-11 DIAGNOSIS — D649 Anemia, unspecified: Secondary | ICD-10-CM | POA: Diagnosis not present

## 2017-08-11 DIAGNOSIS — E669 Obesity, unspecified: Secondary | ICD-10-CM | POA: Diagnosis not present

## 2017-08-11 DIAGNOSIS — D696 Thrombocytopenia, unspecified: Secondary | ICD-10-CM | POA: Diagnosis not present

## 2017-08-11 DIAGNOSIS — Z7984 Long term (current) use of oral hypoglycemic drugs: Secondary | ICD-10-CM | POA: Diagnosis not present

## 2017-08-11 DIAGNOSIS — I10 Essential (primary) hypertension: Secondary | ICD-10-CM | POA: Diagnosis not present

## 2017-08-11 DIAGNOSIS — E7800 Pure hypercholesterolemia, unspecified: Secondary | ICD-10-CM | POA: Diagnosis not present

## 2017-08-25 DIAGNOSIS — C61 Malignant neoplasm of prostate: Secondary | ICD-10-CM | POA: Diagnosis not present

## 2017-08-25 DIAGNOSIS — I10 Essential (primary) hypertension: Secondary | ICD-10-CM | POA: Diagnosis not present

## 2017-08-25 DIAGNOSIS — E119 Type 2 diabetes mellitus without complications: Secondary | ICD-10-CM | POA: Diagnosis not present

## 2017-08-25 DIAGNOSIS — Z7984 Long term (current) use of oral hypoglycemic drugs: Secondary | ICD-10-CM | POA: Diagnosis not present

## 2017-08-25 DIAGNOSIS — E1165 Type 2 diabetes mellitus with hyperglycemia: Secondary | ICD-10-CM | POA: Diagnosis not present

## 2017-09-02 DIAGNOSIS — Z86010 Personal history of colonic polyps: Secondary | ICD-10-CM | POA: Diagnosis not present

## 2017-09-02 DIAGNOSIS — K640 First degree hemorrhoids: Secondary | ICD-10-CM | POA: Diagnosis not present

## 2017-09-02 DIAGNOSIS — D126 Benign neoplasm of colon, unspecified: Secondary | ICD-10-CM | POA: Diagnosis not present

## 2017-09-07 DIAGNOSIS — D126 Benign neoplasm of colon, unspecified: Secondary | ICD-10-CM | POA: Diagnosis not present

## 2017-10-29 ENCOUNTER — Inpatient Hospital Stay: Payer: Medicare Other | Attending: Oncology | Admitting: Oncology

## 2017-10-29 ENCOUNTER — Telehealth: Payer: Self-pay

## 2017-10-29 VITALS — HR 55 | Temp 97.9°F | Resp 18 | Ht 71.0 in | Wt 227.7 lb

## 2017-10-29 DIAGNOSIS — C884 Extranodal marginal zone B-cell lymphoma of mucosa-associated lymphoid tissue [MALT-lymphoma]: Secondary | ICD-10-CM

## 2017-10-29 DIAGNOSIS — E119 Type 2 diabetes mellitus without complications: Secondary | ICD-10-CM | POA: Insufficient documentation

## 2017-10-29 DIAGNOSIS — Z8579 Personal history of other malignant neoplasms of lymphoid, hematopoietic and related tissues: Secondary | ICD-10-CM | POA: Diagnosis not present

## 2017-10-29 DIAGNOSIS — Z8546 Personal history of malignant neoplasm of prostate: Secondary | ICD-10-CM | POA: Diagnosis not present

## 2017-10-29 DIAGNOSIS — Z8572 Personal history of non-Hodgkin lymphomas: Secondary | ICD-10-CM | POA: Insufficient documentation

## 2017-10-29 DIAGNOSIS — Z923 Personal history of irradiation: Secondary | ICD-10-CM | POA: Diagnosis not present

## 2017-10-29 DIAGNOSIS — I10 Essential (primary) hypertension: Secondary | ICD-10-CM | POA: Diagnosis not present

## 2017-10-29 NOTE — Telephone Encounter (Signed)
Printed avs and calender of upcoming appointment. Per 4/12 los

## 2017-10-29 NOTE — Progress Notes (Signed)
  [facility] OFFICE PROGRESS NOTE   Diagnosis: Non-Hodgkin's lymphoma  INTERVAL HISTORY:   [name] returns as scheduled.  He feels well.  He had one night sweat recently.  No consistent night sweats.  Good appetite.  No fever.  No palpable change at the right retroauricular area.  He continues follow-up with dermatology.  Objective:  Vital signs in last 24 hours:  Pulse (!) 55, temperature 97.9 F (36.6 C), temperature source Oral, resp. rate 18, height 5\' 11"  (1.803 m), weight 227 lb 11.2 oz (103.3 kg), SpO2 100 %.    HEENT: Neck without mass Lymphatics: No cervical, supraclavicular, axillary, or inguinal nodes Resp: Lungs clear bilaterally Cardio: Regular rate and rhythm GI: No hepatosplenomegaly, no mass, nontender Vascular: No leg edema Skin: No evidence of recurrent tumor at the right retroauricular area  Medications: I have reviewed the patient's current medications.   Assessment/Plan: 1. 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 Prostate cancer diagnosed in 2009, status post prostatectomy  3. Diabetes  4. Hypertension  5. hyperlipidemiq  6.  Zoster rash right forehead/periorbital region April 2018   Disposition: [name] is in clinical remission from the [place].  He continues follow-up with dermatology.  He would like to continue follow-up at the Cancer center.  He will return for an office visit in 1 year.  15 minutes were spent with the patient today.  The majority of the time was used for counseling and coordination of care.  Betsy Coder, MD  10/29/2017  1:08 PM

## 2017-12-02 DIAGNOSIS — H40053 Ocular hypertension, bilateral: Secondary | ICD-10-CM | POA: Diagnosis not present

## 2017-12-02 DIAGNOSIS — Z961 Presence of intraocular lens: Secondary | ICD-10-CM | POA: Diagnosis not present

## 2017-12-02 DIAGNOSIS — H04223 Epiphora due to insufficient drainage, bilateral lacrimal glands: Secondary | ICD-10-CM | POA: Diagnosis not present

## 2017-12-10 DIAGNOSIS — H04221 Epiphora due to insufficient drainage, right lacrimal gland: Secondary | ICD-10-CM | POA: Diagnosis not present

## 2017-12-10 DIAGNOSIS — H04222 Epiphora due to insufficient drainage, left lacrimal gland: Secondary | ICD-10-CM | POA: Diagnosis not present

## 2018-02-15 DIAGNOSIS — I10 Essential (primary) hypertension: Secondary | ICD-10-CM | POA: Diagnosis not present

## 2018-02-15 DIAGNOSIS — E1169 Type 2 diabetes mellitus with other specified complication: Secondary | ICD-10-CM | POA: Diagnosis not present

## 2018-02-15 DIAGNOSIS — Z1389 Encounter for screening for other disorder: Secondary | ICD-10-CM | POA: Diagnosis not present

## 2018-02-15 DIAGNOSIS — Z79899 Other long term (current) drug therapy: Secondary | ICD-10-CM | POA: Diagnosis not present

## 2018-02-15 DIAGNOSIS — K219 Gastro-esophageal reflux disease without esophagitis: Secondary | ICD-10-CM | POA: Diagnosis not present

## 2018-02-15 DIAGNOSIS — Z7984 Long term (current) use of oral hypoglycemic drugs: Secondary | ICD-10-CM | POA: Diagnosis not present

## 2018-05-12 DIAGNOSIS — H40053 Ocular hypertension, bilateral: Secondary | ICD-10-CM | POA: Diagnosis not present

## 2018-05-26 DIAGNOSIS — H04223 Epiphora due to insufficient drainage, bilateral lacrimal glands: Secondary | ICD-10-CM | POA: Diagnosis not present

## 2018-05-26 DIAGNOSIS — E119 Type 2 diabetes mellitus without complications: Secondary | ICD-10-CM | POA: Diagnosis not present

## 2018-05-26 DIAGNOSIS — H40053 Ocular hypertension, bilateral: Secondary | ICD-10-CM | POA: Diagnosis not present

## 2018-05-26 DIAGNOSIS — H26492 Other secondary cataract, left eye: Secondary | ICD-10-CM | POA: Diagnosis not present

## 2018-06-29 DIAGNOSIS — D2239 Melanocytic nevi of other parts of face: Secondary | ICD-10-CM | POA: Diagnosis not present

## 2018-06-29 DIAGNOSIS — Z85828 Personal history of other malignant neoplasm of skin: Secondary | ICD-10-CM | POA: Diagnosis not present

## 2018-06-29 DIAGNOSIS — D225 Melanocytic nevi of trunk: Secondary | ICD-10-CM | POA: Diagnosis not present

## 2018-06-29 DIAGNOSIS — D2271 Melanocytic nevi of right lower limb, including hip: Secondary | ICD-10-CM | POA: Diagnosis not present

## 2018-06-29 DIAGNOSIS — L821 Other seborrheic keratosis: Secondary | ICD-10-CM | POA: Diagnosis not present

## 2018-07-08 DIAGNOSIS — M5412 Radiculopathy, cervical region: Secondary | ICD-10-CM | POA: Diagnosis not present

## 2018-07-29 DIAGNOSIS — M25512 Pain in left shoulder: Secondary | ICD-10-CM | POA: Diagnosis not present

## 2018-07-29 DIAGNOSIS — Z7984 Long term (current) use of oral hypoglycemic drugs: Secondary | ICD-10-CM | POA: Diagnosis not present

## 2018-07-29 DIAGNOSIS — I10 Essential (primary) hypertension: Secondary | ICD-10-CM | POA: Diagnosis not present

## 2018-07-29 DIAGNOSIS — Z0000 Encounter for general adult medical examination without abnormal findings: Secondary | ICD-10-CM | POA: Diagnosis not present

## 2018-07-29 DIAGNOSIS — Z79899 Other long term (current) drug therapy: Secondary | ICD-10-CM | POA: Diagnosis not present

## 2018-07-29 DIAGNOSIS — D696 Thrombocytopenia, unspecified: Secondary | ICD-10-CM | POA: Diagnosis not present

## 2018-07-29 DIAGNOSIS — Z23 Encounter for immunization: Secondary | ICD-10-CM | POA: Diagnosis not present

## 2018-07-29 DIAGNOSIS — E1169 Type 2 diabetes mellitus with other specified complication: Secondary | ICD-10-CM | POA: Diagnosis not present

## 2018-07-29 DIAGNOSIS — E7800 Pure hypercholesterolemia, unspecified: Secondary | ICD-10-CM | POA: Diagnosis not present

## 2018-07-29 DIAGNOSIS — Z1389 Encounter for screening for other disorder: Secondary | ICD-10-CM | POA: Diagnosis not present

## 2018-10-11 DIAGNOSIS — M1711 Unilateral primary osteoarthritis, right knee: Secondary | ICD-10-CM | POA: Diagnosis not present

## 2018-10-11 DIAGNOSIS — M25561 Pain in right knee: Secondary | ICD-10-CM | POA: Diagnosis not present

## 2018-10-28 ENCOUNTER — Inpatient Hospital Stay: Payer: Medicare Other | Attending: Oncology | Admitting: Oncology

## 2018-11-01 ENCOUNTER — Telehealth: Payer: Self-pay | Admitting: Oncology

## 2018-11-01 NOTE — Telephone Encounter (Signed)
Scheduled appt in 37mths per sch msg. Mailed printout.

## 2018-11-24 DIAGNOSIS — M1712 Unilateral primary osteoarthritis, left knee: Secondary | ICD-10-CM | POA: Diagnosis not present

## 2018-11-24 DIAGNOSIS — M1711 Unilateral primary osteoarthritis, right knee: Secondary | ICD-10-CM | POA: Diagnosis not present

## 2018-11-30 DIAGNOSIS — M5412 Radiculopathy, cervical region: Secondary | ICD-10-CM | POA: Diagnosis not present

## 2018-11-30 DIAGNOSIS — Z6831 Body mass index (BMI) 31.0-31.9, adult: Secondary | ICD-10-CM | POA: Diagnosis not present

## 2018-11-30 DIAGNOSIS — I10 Essential (primary) hypertension: Secondary | ICD-10-CM | POA: Diagnosis not present

## 2018-12-01 DIAGNOSIS — Z961 Presence of intraocular lens: Secondary | ICD-10-CM | POA: Diagnosis not present

## 2018-12-01 DIAGNOSIS — H04223 Epiphora due to insufficient drainage, bilateral lacrimal glands: Secondary | ICD-10-CM | POA: Diagnosis not present

## 2018-12-01 DIAGNOSIS — H40053 Ocular hypertension, bilateral: Secondary | ICD-10-CM | POA: Diagnosis not present

## 2018-12-23 DIAGNOSIS — M1711 Unilateral primary osteoarthritis, right knee: Secondary | ICD-10-CM | POA: Diagnosis not present

## 2018-12-29 ENCOUNTER — Other Ambulatory Visit: Payer: Self-pay

## 2018-12-29 ENCOUNTER — Inpatient Hospital Stay: Payer: Medicare Other | Attending: Oncology | Admitting: Oncology

## 2018-12-29 VITALS — BP 167/78 | HR 57 | Temp 98.9°F | Resp 17 | Ht 71.0 in | Wt 225.2 lb

## 2018-12-29 DIAGNOSIS — Z923 Personal history of irradiation: Secondary | ICD-10-CM | POA: Insufficient documentation

## 2018-12-29 DIAGNOSIS — E119 Type 2 diabetes mellitus without complications: Secondary | ICD-10-CM | POA: Diagnosis not present

## 2018-12-29 DIAGNOSIS — Z8546 Personal history of malignant neoplasm of prostate: Secondary | ICD-10-CM | POA: Diagnosis not present

## 2018-12-29 DIAGNOSIS — Z9079 Acquired absence of other genital organ(s): Secondary | ICD-10-CM | POA: Insufficient documentation

## 2018-12-29 DIAGNOSIS — I10 Essential (primary) hypertension: Secondary | ICD-10-CM | POA: Insufficient documentation

## 2018-12-29 DIAGNOSIS — Z8572 Personal history of non-Hodgkin lymphomas: Secondary | ICD-10-CM | POA: Diagnosis not present

## 2018-12-29 DIAGNOSIS — C884 Extranodal marginal zone B-cell lymphoma of mucosa-associated lymphoid tissue [MALT-lymphoma]: Secondary | ICD-10-CM

## 2018-12-29 NOTE — Progress Notes (Signed)
  [name] OFFICE PROGRESS NOTE   Diagnosis: Marginal zone lymphoma  INTERVAL HISTORY:   [name] returns as scheduled.  [name] feels well.  Good appetite.  [name] relates weight loss to a change in his diet.  No palpable change of the right ear.  No fever or night sweats.  [name] is working part-time from home.  [name] continues follow-up with dermatology.  Objective:  Vital signs in last 24 hours:  Blood pressure (!) 167/78, height 5\' 11"  (1.803 m), weight 225 lb 3.2 oz (102.2 kg). Limited physical examination secondary to distancing with the COVID pandemic  HEENT: Neck without mass Lymphatics: No cervical, supraclavicular, axillary, or inguinal nodes GI: No mass, nontender, no hepatosplenomegaly Vascular: No leg edema  Skin: Right posterior auricular scar without evidence of recurrent tumor  Medications: I have reviewed the patient's current medications.   Assessment/Plan: 1. 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 Prostate cancer diagnosed in 2009, status post prostatectomy  3. Diabetes  4. Hypertension  5. hyperlipidemiq  6.  Zoster rash right forehead/periorbital region April 2018    Disposition: [name] is in remission from the marginal zone lymphoma.  [name] would like to continue follow-up at the Cancer center.  [name] will return for office visit in 1 year.  [name] will contact us in the interim for new symptoms.  Betsy Coder, MD  12/29/2018  3:56 PM

## 2018-12-30 ENCOUNTER — Telehealth: Payer: Self-pay | Admitting: Oncology

## 2018-12-30 DIAGNOSIS — M1711 Unilateral primary osteoarthritis, right knee: Secondary | ICD-10-CM | POA: Diagnosis not present

## 2018-12-30 NOTE — Telephone Encounter (Signed)
Scheduled appt per 6/11 los. A calendar will be mailed out. °

## 2019-01-06 DIAGNOSIS — M25561 Pain in right knee: Secondary | ICD-10-CM | POA: Diagnosis not present

## 2019-01-06 DIAGNOSIS — M1711 Unilateral primary osteoarthritis, right knee: Secondary | ICD-10-CM | POA: Diagnosis not present

## 2019-01-31 DIAGNOSIS — I10 Essential (primary) hypertension: Secondary | ICD-10-CM | POA: Diagnosis not present

## 2019-01-31 DIAGNOSIS — Z79899 Other long term (current) drug therapy: Secondary | ICD-10-CM | POA: Diagnosis not present

## 2019-01-31 DIAGNOSIS — E1169 Type 2 diabetes mellitus with other specified complication: Secondary | ICD-10-CM | POA: Diagnosis not present

## 2019-02-03 DIAGNOSIS — Z79899 Other long term (current) drug therapy: Secondary | ICD-10-CM | POA: Diagnosis not present

## 2019-02-03 DIAGNOSIS — E1169 Type 2 diabetes mellitus with other specified complication: Secondary | ICD-10-CM | POA: Diagnosis not present

## 2019-06-08 DIAGNOSIS — Z20828 Contact with and (suspected) exposure to other viral communicable diseases: Secondary | ICD-10-CM | POA: Diagnosis not present

## 2019-06-23 DIAGNOSIS — H40013 Open angle with borderline findings, low risk, bilateral: Secondary | ICD-10-CM | POA: Diagnosis not present

## 2019-06-23 DIAGNOSIS — H5213 Myopia, bilateral: Secondary | ICD-10-CM | POA: Diagnosis not present

## 2019-06-23 DIAGNOSIS — E119 Type 2 diabetes mellitus without complications: Secondary | ICD-10-CM | POA: Diagnosis not present

## 2019-06-23 DIAGNOSIS — H40053 Ocular hypertension, bilateral: Secondary | ICD-10-CM | POA: Diagnosis not present

## 2019-07-20 DIAGNOSIS — E7800 Pure hypercholesterolemia, unspecified: Secondary | ICD-10-CM | POA: Diagnosis not present

## 2019-07-20 DIAGNOSIS — I10 Essential (primary) hypertension: Secondary | ICD-10-CM | POA: Diagnosis not present

## 2019-07-20 DIAGNOSIS — C61 Malignant neoplasm of prostate: Secondary | ICD-10-CM | POA: Diagnosis not present

## 2019-07-20 DIAGNOSIS — E1169 Type 2 diabetes mellitus with other specified complication: Secondary | ICD-10-CM | POA: Diagnosis not present

## 2019-08-15 DIAGNOSIS — D696 Thrombocytopenia, unspecified: Secondary | ICD-10-CM | POA: Diagnosis not present

## 2019-08-15 DIAGNOSIS — E7800 Pure hypercholesterolemia, unspecified: Secondary | ICD-10-CM | POA: Diagnosis not present

## 2019-08-15 DIAGNOSIS — Z0000 Encounter for general adult medical examination without abnormal findings: Secondary | ICD-10-CM | POA: Diagnosis not present

## 2019-08-15 DIAGNOSIS — I10 Essential (primary) hypertension: Secondary | ICD-10-CM | POA: Diagnosis not present

## 2019-08-15 DIAGNOSIS — E1169 Type 2 diabetes mellitus with other specified complication: Secondary | ICD-10-CM | POA: Diagnosis not present

## 2019-08-15 DIAGNOSIS — M25561 Pain in right knee: Secondary | ICD-10-CM | POA: Diagnosis not present

## 2019-08-15 DIAGNOSIS — Z1389 Encounter for screening for other disorder: Secondary | ICD-10-CM | POA: Diagnosis not present

## 2019-08-15 DIAGNOSIS — K219 Gastro-esophageal reflux disease without esophagitis: Secondary | ICD-10-CM | POA: Diagnosis not present

## 2019-08-15 DIAGNOSIS — Z8546 Personal history of malignant neoplasm of prostate: Secondary | ICD-10-CM | POA: Diagnosis not present

## 2019-08-15 DIAGNOSIS — N433 Hydrocele, unspecified: Secondary | ICD-10-CM | POA: Diagnosis not present

## 2019-08-15 DIAGNOSIS — Z79899 Other long term (current) drug therapy: Secondary | ICD-10-CM | POA: Diagnosis not present

## 2019-08-15 DIAGNOSIS — Z7984 Long term (current) use of oral hypoglycemic drugs: Secondary | ICD-10-CM | POA: Diagnosis not present

## 2019-08-18 DIAGNOSIS — N430 Encysted hydrocele: Secondary | ICD-10-CM | POA: Diagnosis not present

## 2019-08-18 DIAGNOSIS — Z8546 Personal history of malignant neoplasm of prostate: Secondary | ICD-10-CM | POA: Diagnosis not present

## 2019-08-18 DIAGNOSIS — N5231 Erectile dysfunction following radical prostatectomy: Secondary | ICD-10-CM | POA: Diagnosis not present

## 2019-08-18 DIAGNOSIS — N393 Stress incontinence (female) (male): Secondary | ICD-10-CM | POA: Diagnosis not present

## 2019-12-04 DIAGNOSIS — H6123 Impacted cerumen, bilateral: Secondary | ICD-10-CM | POA: Diagnosis not present

## 2019-12-04 DIAGNOSIS — I10 Essential (primary) hypertension: Secondary | ICD-10-CM | POA: Diagnosis not present

## 2019-12-04 DIAGNOSIS — H8113 Benign paroxysmal vertigo, bilateral: Secondary | ICD-10-CM | POA: Diagnosis not present

## 2019-12-19 ENCOUNTER — Encounter: Payer: Self-pay | Admitting: Physical Therapy

## 2019-12-19 ENCOUNTER — Ambulatory Visit: Payer: Medicare Other | Attending: Geriatric Medicine | Admitting: Physical Therapy

## 2019-12-19 ENCOUNTER — Other Ambulatory Visit: Payer: Self-pay

## 2019-12-19 DIAGNOSIS — H8112 Benign paroxysmal vertigo, left ear: Secondary | ICD-10-CM | POA: Insufficient documentation

## 2019-12-19 DIAGNOSIS — R2681 Unsteadiness on feet: Secondary | ICD-10-CM | POA: Diagnosis present

## 2019-12-19 NOTE — Patient Instructions (Signed)
Sit to Side-Lying    Sit on edge of bed. 1. Turn head 45 to right. 2. Maintain head position and lie down slowly on left side. Hold until symptoms subside. 3. Sit up slowly. Hold until symptoms subside. 4. Turn head 45 to left. 5. Maintain head position and lie down slowly on right side. Hold until symptoms subside. 6. Sit up slowly. Repeat sequence __5__ times per session. Do _2-3___ sessions per day.    Benign Positional Vertigo Vertigo is the feeling that you or your surroundings are moving when they are not. Benign positional vertigo is the most common form of vertigo. This is usually a harmless condition (benign). This condition is positional. This means that symptoms are triggered by certain movements and positions. This condition can be dangerous if it occurs while you are doing something that could cause harm to you or others. This includes activities such as driving or operating machinery. What are the causes? In many cases, the cause of this condition is not known. It may be caused by a disturbance in an area of the inner ear that helps your brain to sense movement and balance. This disturbance can be caused by:  Viral infection (labyrinthitis).  Head injury.  Repetitive motion, such as jumping, dancing, or running. What increases the risk? You are more likely to develop this condition if:  You are a woman.  You are 30 years of age or older. What are the signs or symptoms? Symptoms of this condition usually happen when you move your head or your eyes in different directions. Symptoms may start suddenly, and usually last for less than a minute. They include:  Loss of balance and falling.  Feeling like you are spinning or moving.  Feeling like your surroundings are spinning or moving.  Nausea and vomiting.  Blurred vision.  Dizziness.  Involuntary eye movement (nystagmus). Symptoms can be mild and cause only minor problems, or they can be severe and interfere with  daily life. Episodes of benign positional vertigo may return (recur) over time. Symptoms may improve over time. How is this diagnosed? This condition may be diagnosed based on:  Your medical history.  Physical exam of the head, neck, and ears.  Tests, such as: ? MRI. ? CT scan. ? Eye movement tests. Your health care provider may ask you to change positions quickly while he or she watches you for symptoms of benign positional vertigo, such as nystagmus. Eye movement may be tested with a variety of exams that are designed to evaluate or stimulate vertigo. ? An electroencephalogram (EEG). This records electrical activity in your brain. ? Hearing tests. You may be referred to a health care provider who specializes in ear, nose, and throat (ENT) problems (otolaryngologist) or a provider who specializes in disorders of the nervous system (neurologist). How is this treated?  This condition may be treated in a session in which your health care provider moves your head in specific positions to adjust your inner ear back to normal. Treatment for this condition may take several sessions. Surgery may be needed in severe cases, but this is rare. In some cases, benign positional vertigo may resolve on its own in 2-4 weeks. Follow these instructions at home: Safety  Move slowly. Avoid sudden body or head movements or certain positions, as told by your health care provider.  Avoid driving until your health care provider says it is safe for you to do so.  Avoid operating heavy machinery until your health care provider says  it is safe for you to do so.  Avoid doing any tasks that would be dangerous to you or others if vertigo occurs.  If you have trouble walking or keeping your balance, try using a cane for stability. If you feel dizzy or unstable, sit down right away.  Return to your normal activities as told by your health care provider. Ask your health care provider what activities are safe for  you. General instructions  Take over-the-counter and prescription medicines only as told by your health care provider.  Drink enough fluid to keep your urine pale yellow.  Keep all follow-up visits as told by your health care provider. This is important. Contact a health care provider if:  You have a fever.  Your condition gets worse or you develop new symptoms.  Your family or friends notice any behavioral changes.  You have nausea or vomiting that gets worse.  You have numbness or a "pins and needles" sensation. Get help right away if you:  Have difficulty speaking or moving.  Are always dizzy.  Faint.  Develop severe headaches.  Have weakness in your legs or arms.  Have changes in your hearing or vision.  Develop a stiff neck.  Develop sensitivity to light. Summary  Vertigo is the feeling that you or your surroundings are moving when they are not. Benign positional vertigo is the most common form of vertigo.  The cause of this condition is not known. It may be caused by a disturbance in an area of the inner ear that helps your brain to sense movement and balance.  Symptoms include loss of balance and falling, feeling that you or your surroundings are moving, nausea and vomiting, and blurred vision.  This condition can be diagnosed based on symptoms, physical exam, and other tests, such as MRI, CT scan, eye movement tests, and hearing tests.  Follow safety instructions as told by your health care provider. You will also be told when to contact your health care provider in case of problems. This information is not intended to replace advice given to you by your health care provider. Make sure you discuss any questions you have with your health care provider. Document Revised: 12/15/2017 Document Reviewed: 12/15/2017 Elsevier Patient Education  [employer].

## 2019-12-20 ENCOUNTER — Encounter: Payer: Self-pay | Admitting: Physical Therapy

## 2019-12-20 NOTE — Therapy (Signed)
[facility] [address] [place], [place], [address] Phone: [phone]   Fax:  [phone]  Physical Therapy Evaluation  Patient Details  Name: [name] MRN: [id number] Date of Birth: 1941/04/20 Referring Provider (PT): Dr. Lajean Manes   Encounter Date: 12/19/2019  PT End of Session - 12/20/19 1503    Visit Number  1    Number of Visits  4    Date for PT Re-Evaluation  01/19/20    Authorization Type  Medicare    Authorization Time Period  12-19-19 - 03-19-20    PT Start Time  0932    PT Stop Time  1020    PT Time Calculation (min)  48 min    Activity Tolerance  Patient tolerated treatment well    Behavior During Therapy  [facility] for tasks assessed/performed       Past Medical History:  Diagnosis Date  . Acute meniscal tear of right knee   . Arthritis   . Cancer [facility]) 2015   lymphoma new dx area removed behind right ear  . Cancer [facility]) 2010   prostate  . Deviated septum    chronic sinusitis, nasal turbinate hypertrophy  . Diabetes mellitus without complication ([place])    on metformin  . GERD (gastroesophageal reflux disease)   . Hypertension   . Pneumonia   . Sleep apnea    wears CPAP  . Wears glasses     Past Surgical History:  Procedure Laterality Date  . back injection     to lower back  . BACK SURGERY     lower back  . KNEE ARTHROSCOPY Right 03/27/2015   Procedure: RIGHT ARTHROSCOPY KNEE WITH DEBRIDEMENT, PARTIAL LATERAL MENISCECTOMY;  Surgeon: Gaynelle Arabian, MD;  [facility];  Service: Orthopedics;  Laterality: Right;  . NASAL SEPTOPLASTY W/ TURBINOPLASTY Bilateral 04/01/2016   Procedure: NASAL SEPTOPLASTY WITH BILATERAL INFERIOR  TURBINATE REDUCTION;  Surgeon: Jerrell Belfast, MD;  Location: [place];  Service: ENT;  Laterality: Bilateral;  . PROSTATECTOMY  2010  . SINUS ENDO WITH FUSION Right 04/01/2016   Procedure: RIGHT ENDOSCOPIC SINUS SURGERY,AND CONSISTING  OF ANTERIOR ETHMOIDECTOMY;   Surgeon: Jerrell Belfast, MD;  Location: [place];  Service: ENT;  Laterality: Right;  . surgery for collapsed lung Left 35 to 40 years ago    There were no vitals filed for this visit.   Subjective Assessment - 12/19/19 0938    Subjective  Pt states he notices the dizziness the most when he lies down and turns over in bed; pt states he felt it some last night when he stood up. States he has learned to look straight ahead and not look down    Pertinent History  h/o prostate cancer    Patient Stated Goals  resolve the vertigo    Currently in Pain?  No/denies         [facility] PT Assessment - 12/20/19 0001      Assessment   Medical Diagnosis  BPPV    Referring Provider (PT)  Dr. Lajean Manes    Onset Date/Surgical Date  --   mid May 2021     Precautions   Precautions  None      Balance Screen   Has the patient fallen in the past 6 months  No    Has the patient had a decrease in activity level because of a fear of falling?   No    Is the patient reluctant to leave their home because  of a fear of falling?   No      Prior Function   Level of Independence  Independent             Vestibular Assessment - 12/20/19 0001      Symptom Behavior   Subjective history of current problem  started about 3 weeks ago - occurs when he lies down or turns over in bed    Type of Dizziness   Spinning    Frequency of Dizziness  depends on the movement    Duration of Dizziness  secs to minutes    Symptom Nature  Positional    Aggravating Factors  Rolling to left;Forward bending    Relieving Factors  Head stationary    Progression of Symptoms  No change since onset    History of similar episodes  none      Oculomotor Exam   Oculomotor Alignment  Normal    Spontaneous  Absent      Positional Testing   Dix-Hallpike  Dix-Hallpike Right;Dix-Hallpike Left    Sidelying Test  Sidelying Right;Sidelying Left      Dix-Hallpike Right   Dix-Hallpike Right Duration  none    Dix-Hallpike Right  Symptoms  No nystagmus      Dix-Hallpike Left   Dix-Hallpike Left Duration  approx. 15 secs    Dix-Hallpike Left Symptoms  Upbeat, left rotatory nystagmus      Sidelying Right   Sidelying Right Duration  none    Sidelying Right Symptoms  No nystagmus      Sidelying Left   Sidelying Left Duration  approx 5 secs    Sidelying Left Symptoms  No nystagmus          Objective measurements completed on examination: See above findings.      Epley maneuver for Lt BPPV performed 3 reps - improvement noted on 3rd rep        PT Education - 12/20/19 1501    Education Details  Pt was given info on etiology of BPPV and instructed in Brandt-Daroff exercise for HEP    Person(s) Educated  Patient    Methods  Explanation;Demonstration;Handout    Comprehension  Verbalized understanding;Returned demonstration          PT Long Term Goals - 12/20/19 1512      PT LONG TERM GOAL #1   Title  Pt wll have a (-) Lt Dix-Hallpike test to indicate resolution of Lt BPPV.    Time  4    Period  Weeks    Status  New    Target Date  01/19/20      PT LONG TERM GOAL #2   Title  Pt will report no dizziness with bed mobility or with bending over to pick up object off floor.    Time  4    Period  Weeks    Status  New    Target Date  01/19/20      PT LONG TERM GOAL #3   Title  Independent in HEP for habituation.    Time  4    Period  Weeks    Status  New    Target Date  01/19/20             Plan - 12/20/19 1505    Clinical Impression Statement  Pt has (+) Lt Dix-Hallpike test with Lt rotary upbeating nystagmus, indicative of Lt BPPV posterior canalithiasis. Pt was treated with 3 reps of Epley maneuver with slight improvement noted and  reported on 3rd rep.  Pt continued to have c/o dizziness with return to upright position from Rt sidelying position.    Personal Factors and Comorbidities  Comorbidity 1    Examination-Activity Limitations  Locomotion Level;Bed Mobility     Examination-Participation Restrictions  Yard Work;Cleaning;Interpersonal Relationship;Community Activity    Stability/Clinical Decision Making  Stable/Uncomplicated    Clinical Decision Making  Low    Rehab Potential  Good    PT Frequency  1x / week    PT Duration  4 weeks    PT Treatment/Interventions  Vestibular;Therapeutic activities;Therapeutic exercise;Balance training;Neuromuscular re-education;ADLs/Self Care Home Management;Patient/family education;Canalith Repostioning    PT Next Visit Plan  recheck Lt Dix-Hallpike test - treat prn    PT Home Exercise Plan  Brandt-Daroff exercises    Consulted and Agree with Plan of Care  Patient       Patient will benefit from skilled therapeutic intervention in order to improve the following deficits and impairments:  Dizziness, Decreased balance, Difficulty walking  Visit Diagnosis: BPPV (benign paroxysmal positional vertigo), left - Plan: PT plan of care cert/re-cert  Unsteadiness on feet - Plan: PT plan of care cert/re-cert     Problem List Patient Active Problem List   Diagnosis Date Noted  . Deviated nasal septum 04/01/2016  . Sinusitis, chronic 04/01/2016  . Prostate cancer ([place])   . Lateral meniscal tear 03/27/2015  . 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

## 2019-12-21 DIAGNOSIS — H40053 Ocular hypertension, bilateral: Secondary | ICD-10-CM | POA: Diagnosis not present

## 2019-12-21 DIAGNOSIS — E119 Type 2 diabetes mellitus without complications: Secondary | ICD-10-CM | POA: Diagnosis not present

## 2019-12-21 DIAGNOSIS — Z961 Presence of intraocular lens: Secondary | ICD-10-CM | POA: Diagnosis not present

## 2019-12-29 ENCOUNTER — Inpatient Hospital Stay: Payer: Medicare Other | Attending: Oncology | Admitting: Oncology

## 2019-12-29 ENCOUNTER — Other Ambulatory Visit: Payer: Self-pay

## 2019-12-29 VITALS — BP 133/67 | HR 54 | Temp 97.5°F | Resp 18 | Ht 71.0 in | Wt 222.1 lb

## 2019-12-29 DIAGNOSIS — E785 Hyperlipidemia, unspecified: Secondary | ICD-10-CM | POA: Insufficient documentation

## 2019-12-29 DIAGNOSIS — C884 Extranodal marginal zone B-cell lymphoma of mucosa-associated lymphoid tissue [MALT-lymphoma]: Secondary | ICD-10-CM | POA: Diagnosis not present

## 2019-12-29 DIAGNOSIS — Z9079 Acquired absence of other genital organ(s): Secondary | ICD-10-CM | POA: Insufficient documentation

## 2019-12-29 DIAGNOSIS — E119 Type 2 diabetes mellitus without complications: Secondary | ICD-10-CM | POA: Diagnosis not present

## 2019-12-29 DIAGNOSIS — Z8546 Personal history of malignant neoplasm of prostate: Secondary | ICD-10-CM | POA: Insufficient documentation

## 2019-12-29 DIAGNOSIS — R42 Dizziness and giddiness: Secondary | ICD-10-CM | POA: Insufficient documentation

## 2019-12-29 DIAGNOSIS — Z8572 Personal history of non-Hodgkin lymphomas: Secondary | ICD-10-CM | POA: Insufficient documentation

## 2019-12-29 DIAGNOSIS — I10 Essential (primary) hypertension: Secondary | ICD-10-CM | POA: Insufficient documentation

## 2019-12-29 NOTE — Progress Notes (Signed)
  [name] OFFICE PROGRESS NOTE   Diagnosis: Marginal zone lymphoma  INTERVAL HISTORY:   [name] returns as scheduled.  He feels well.  Good appetite.  No recurrent mass at the right ear.  No palpable lymph nodes.  He had an episode of vertigo when turning over in bed last week.  He saw Dr. Felipa Eth and was referred to neurology.  He is undergoing vestibular physical therapy.  His symptoms have improved.  He continues to have mild intermittent episodes of positional vertigo.  Objective:  Vital signs in last 24 hours:  Blood pressure 133/67, pulse (!) 54, temperature (!) 97.5 F (36.4 C), temperature source Temporal, resp. rate 18, height 5\' 11"  (1.803 m), weight 222 lb 1.6 oz (100.7 kg), SpO2 100 %.    HEENT: Neck without mass Lymphatics: No cervical, supraclavicular, axillary, or inguinal nodes Resp: Lungs with bronchial sounds at the upper posterior chest bilaterally, no respiratory distress Cardio: Regular rate and rhythm GI: No hepatosplenomegaly, no mass, nontender Vascular: No leg edema Neuro: No nystagmus Skin: Right postauricular scar without evidence of recurrent tumor    Lab Results:  Lab Results  Component Value Date   WBC 7.2 03/30/2016   HGB 14.1 03/30/2016   HCT 41.1 03/30/2016   MCV 89.3 03/30/2016   PLT 154 03/30/2016   NEUTROABS 4.4 04/30/2014    CMP  Lab Results  Component Value Date   NA 136 03/30/2016   K 4.1 03/30/2016   CL 103 03/30/2016   CO2 26 03/30/2016   GLUCOSE 208 (H) 03/30/2016   BUN 14 03/30/2016   CREATININE 1.19 03/30/2016   CALCIUM 9.5 03/30/2016   PROT 8.3 04/30/2014   ALBUMIN 4.2 04/30/2014   AST 18 04/30/2014   ALT 22 04/30/2014   ALKPHOS 58 04/30/2014   BILITOT 0.35 04/30/2014   GFRNONAA 58 (L) 03/30/2016   GFRAA >60 03/30/2016    Medications: I have reviewed the patient's current medications.   Assessment/Plan:  1. 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 Prostate cancer diagnosed in 2009, status post prostatectomy  3. Diabetes  4. Hypertension  5. hyperlipidemiq  6.  Zoster rash right forehead/periorbital region April 2018  7.  Positional vertigo June 2021   Disposition: [name] remains in clinical remission from the marginal zone lymphoma.  I doubt the positional vertigo is related to the history of lymphoma.  He is followed by Dr. Felipa Eth and neurology.  [name] would like to continue follow-up at the Cancer center.  He will return for an office visit in 1 year.  Betsy Coder, MD  12/29/2019  11:26 AM

## 2020-01-01 ENCOUNTER — Encounter: Payer: Self-pay | Admitting: Physical Therapy

## 2020-01-01 ENCOUNTER — Other Ambulatory Visit: Payer: Self-pay

## 2020-01-01 ENCOUNTER — Ambulatory Visit: Payer: Medicare Other | Admitting: Physical Therapy

## 2020-01-01 ENCOUNTER — Telehealth: Payer: Self-pay | Admitting: Oncology

## 2020-01-01 DIAGNOSIS — I10 Essential (primary) hypertension: Secondary | ICD-10-CM | POA: Diagnosis not present

## 2020-01-01 DIAGNOSIS — Z7984 Long term (current) use of oral hypoglycemic drugs: Secondary | ICD-10-CM | POA: Diagnosis not present

## 2020-01-01 DIAGNOSIS — H8112 Benign paroxysmal vertigo, left ear: Secondary | ICD-10-CM | POA: Diagnosis not present

## 2020-01-01 DIAGNOSIS — E1169 Type 2 diabetes mellitus with other specified complication: Secondary | ICD-10-CM | POA: Diagnosis not present

## 2020-01-01 DIAGNOSIS — H8110 Benign paroxysmal vertigo, unspecified ear: Secondary | ICD-10-CM | POA: Diagnosis not present

## 2020-01-01 NOTE — Patient Instructions (Signed)
Rolling    With pillow under head, start on back. Roll slowly to right. Hold position until symptoms subside. Roll slowly onto left side. Hold position until symptoms subside. Repeat sequence __5__ times per session. Do __2-3__ sessions per day.  Copyright  VHI. All rights reserved.

## 2020-01-01 NOTE — Telephone Encounter (Signed)
Scheduled per los. Called and left msg. Mailed printout  °

## 2020-01-02 ENCOUNTER — Ambulatory Visit: Payer: Medicare Other | Admitting: Physical Therapy

## 2020-01-02 DIAGNOSIS — H8112 Benign paroxysmal vertigo, left ear: Secondary | ICD-10-CM

## 2020-01-02 NOTE — Therapy (Signed)
[facility] [address] [place], [place], [address] Phone: [phone]   Fax:  [phone]  Physical Therapy Treatment  Patient Details  Name: [name] MRN: [id number] Date of Birth: [birth year] Referring Provider (PT): Dr. Lajean Manes   Encounter Date: 01/01/2020   PT End of Session - 01/02/20 1916    Visit Number 2    Number of Visits 4    Date for PT Re-Evaluation 01/19/20    Authorization Type Medicare    Authorization Time Period 12-19-19 - 03-19-20    PT Start Time 0800    PT Stop Time 0845    PT Time Calculation (min) 45 min    Activity Tolerance Patient tolerated treatment well    Behavior During Therapy [facility] for tasks assessed/performed           Past Medical History:  Diagnosis Date  . Acute meniscal tear of right knee   . Arthritis   . Cancer [facility]) 2015   lymphoma new dx area removed behind right ear  . Cancer [facility]) 2010   prostate  . Deviated septum    chronic sinusitis, nasal turbinate hypertrophy  . Diabetes mellitus without complication ([place])    on metformin  . GERD (gastroesophageal reflux disease)   . Hypertension   . Pneumonia   . Sleep apnea    wears CPAP  . Wears glasses     Past Surgical History:  Procedure Laterality Date  . back injection     to lower back  . BACK SURGERY     lower back  . KNEE ARTHROSCOPY Right 03/27/2015   Procedure: RIGHT ARTHROSCOPY KNEE WITH DEBRIDEMENT, PARTIAL LATERAL MENISCECTOMY;  Surgeon: Gaynelle Arabian, MD;  [facility];  Service: Orthopedics;  Laterality: Right;  . NASAL SEPTOPLASTY W/ TURBINOPLASTY Bilateral 04/01/2016   Procedure: NASAL SEPTOPLASTY WITH BILATERAL INFERIOR  TURBINATE REDUCTION;  Surgeon: Jerrell Belfast, MD;  Location: [place];  Service: ENT;  Laterality: Bilateral;  . PROSTATECTOMY  2010  . SINUS ENDO WITH FUSION Right 04/01/2016   Procedure: RIGHT ENDOSCOPIC SINUS SURGERY,AND CONSISTING  OF ANTERIOR ETHMOIDECTOMY;   Surgeon: Jerrell Belfast, MD;  Location: [place];  Service: ENT;  Laterality: Right;  . surgery for collapsed lung Left 35 to 40 years ago    There were no vitals filed for this visit.      NeuroRe-ed:  Pt has (+) Lt Dix-Hallpike test with rotary nystagmus and c/o dizziness - indicative of Lt BPPV  Pt was treated with Lt Epley maneuver for 3 reps - horizontal nystagmus occurred on 3rd rep after head was turned from Lt to Rt side - indicative of Lt horizontal canalithiasis  Pt had (+) Lt horizontal roll test after nystagmus was noted  Pt was then treated with [place] roll for Lt horizontal canalithiasis - 2 reps completed  Pt was instructed in repetitive rolling for Lt horizontal canal BPPV   Self care - reviewed Brandt-Daroff exercises previously given for HEP; reviewed etiology of BPPV - continued to recommend plenty of water intake for good hydration                      PT Education - 01/02/20 1905    Education Details rolling for habituation for Lt horizontal canalithiasis    Person(s) Educated Patient    Methods Explanation;Demonstration;Handout    Comprehension Verbalized understanding;Returned demonstration               PT Long Term  Goals - 01/02/20 1912      PT LONG TERM GOAL #1   Title Pt wll have a (-) Lt Dix-Hallpike test to indicate resolution of Lt BPPV.    Time 4    Period Weeks    Status New      PT LONG TERM GOAL #2   Title Pt will report no dizziness with bed mobility or with bending over to pick up object off floor.    Time 4    Period Weeks    Status New      PT LONG TERM GOAL #3   Title Independent in HEP for habituation.    Time 4    Period Weeks    Status New                 Plan - 01/02/20 1908    Clinical Impression Statement Pt had (+) Lt Dix-Hallpike test with c/o dizziness and Lt rotary upbeating nystagmus, indicative of Lt BPPV; Lt horizontal nystagmus was noted on 3rd rep of Epley, indicative that BPPV  had converted from posterior canal to Lt horizontal canal.    Personal Factors and Comorbidities Comorbidity 1    Examination-Activity Limitations Locomotion Level;Bed Mobility    Examination-Participation Restrictions Yard Work;Cleaning;Interpersonal Relationship;Community Activity    Stability/Clinical Decision Making Stable/Uncomplicated    Rehab Potential Good    PT Frequency 1x / week    PT Duration 4 weeks    PT Treatment/Interventions Vestibular;Therapeutic activities;Therapeutic exercise;Balance training;Neuromuscular re-education;ADLs/Self Care Home Management;Patient/family education;Canalith Repostioning    PT Next Visit Plan recheck Lt Dix-Hallpike test - treat prn    PT Home Exercise Plan Brandt-Daroff exercises    Consulted and Agree with Plan of Care Patient           Patient will benefit from skilled therapeutic intervention in order to improve the following deficits and impairments:  Dizziness, Decreased balance, Difficulty walking  Visit Diagnosis: BPPV (benign paroxysmal positional vertigo), left     Problem List Patient Active Problem List   Diagnosis Date Noted  . Deviated nasal septum 04/01/2016  . Sinusitis, chronic 04/01/2016  . Prostate cancer ([place])   . Lateral meniscal tear 03/27/2015  . 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

## 2020-01-03 ENCOUNTER — Encounter: Payer: Self-pay | Admitting: Physical Therapy

## 2020-01-03 NOTE — Therapy (Signed)
[facility] [address] [place], [place], [address] Phone: [phone]   Fax:  [phone]  Physical Therapy Treatment  Patient Details  Name: [name] MRN: [id number] Date of Birth: Feb 28, 1941 Referring Provider (PT): Dr. Lajean Manes   Encounter Date: 01/02/2020   PT End of Session - 01/03/20 2101    Visit Number 3    Number of Visits 4    Date for PT Re-Evaluation 01/19/20    Authorization Type Medicare    Authorization Time Period 12-19-19 - 03-19-20    PT Start Time 1533    PT Stop Time 1556    PT Time Calculation (min) 23 min    Activity Tolerance Patient tolerated treatment well    Behavior During Therapy [facility] for tasks assessed/performed           Past Medical History:  Diagnosis Date  . Acute meniscal tear of right knee   . Arthritis   . Cancer [facility]) 2015   lymphoma new dx area removed behind right ear  . Cancer [facility]) 2010   prostate  . Deviated septum    chronic sinusitis, nasal turbinate hypertrophy  . Diabetes mellitus without complication ([place])    on metformin  . GERD (gastroesophageal reflux disease)   . Hypertension   . Pneumonia   . Sleep apnea    wears CPAP  . Wears glasses     Past Surgical History:  Procedure Laterality Date  . back injection     to lower back  . BACK SURGERY     lower back  . KNEE ARTHROSCOPY Right 03/27/2015   Procedure: RIGHT ARTHROSCOPY KNEE WITH DEBRIDEMENT, PARTIAL LATERAL MENISCECTOMY;  Surgeon: Gaynelle Arabian, MD;  [facility];  Service: Orthopedics;  Laterality: Right;  . NASAL SEPTOPLASTY W/ TURBINOPLASTY Bilateral 04/01/2016   Procedure: NASAL SEPTOPLASTY WITH BILATERAL INFERIOR  TURBINATE REDUCTION;  Surgeon: Jerrell Belfast, MD;  Location: [place];  Service: ENT;  Laterality: Bilateral;  . PROSTATECTOMY  2010  . SINUS ENDO WITH FUSION Right 04/01/2016   Procedure: RIGHT ENDOSCOPIC SINUS SURGERY,AND CONSISTING  OF ANTERIOR ETHMOIDECTOMY;   Surgeon: Jerrell Belfast, MD;  Location: [place];  Service: ENT;  Laterality: Right;  . surgery for collapsed lung Left 35 to 40 years ago    There were no vitals filed for this visit.   Subjective Assessment - 01/03/20 2057    Subjective Pt states he thinks the vertigo is resolved - says he got up out of bed this morning and felt very steady - had no LOB; did the rolling exercise at home    Pertinent History h/o prostate cancer    Patient Stated Goals resolve the vertigo    Currently in Pain? No/denies                   Vestibular Assessment - 01/03/20 0001      Positional Testing   Dix-Hallpike Dix-Hallpike Left    Sidelying Test Sidelying Right;Sidelying Left    Horizontal Canal Testing Horizontal Canal Right;Horizontal Canal Left      Dix-Hallpike Right   Dix-Hallpike Right Duration none    Dix-Hallpike Right Symptoms No nystagmus      Dix-Hallpike Left   Dix-Hallpike Left Duration none    Dix-Hallpike Left Symptoms No nystagmus      Sidelying Right   Sidelying Right Duration none    Sidelying Right Symptoms No nystagmus      Sidelying Left   Sidelying Left Duration none  Sidelying Left Symptoms No nystagmus      Horizontal Canal Right   Horizontal Canal Right Duration none    Horizontal Canal Right Symptoms Normal      Horizontal Canal Left   Horizontal Canal Left Duration none    Horizontal Canal Left Symptoms Normal                            PT Education - 01/03/20 2100    Education Details reviewed HEP - rolling and Brandt-Daroff    Person(s) Educated Patient    Methods Explanation;Demonstration    Comprehension Verbalized understanding;Returned demonstration               PT Long Term Goals - 01/03/20 2103      PT LONG TERM GOAL #1   Title Pt wll have a (-) Lt Dix-Hallpike test to indicate resolution of Lt BPPV.    Time 4    Period Weeks    Status New      PT LONG TERM GOAL #2   Title Pt will report no dizziness  with bed mobility or with bending over to pick up object off floor.    Time 4    Period Weeks    Status New      PT LONG TERM GOAL #3   Title Independent in HEP for habituation.    Time 4    Period Weeks    Status New                 Plan - 01/03/20 2101    Clinical Impression Statement Pt had (-) Lt Dix-Hallpike test and (-) Lt horizontal roll test indicative of resolution of Lt BPPV - both posterior & horizontal canals.  Pt states he feels much more steady with amb. at this time.    Personal Factors and Comorbidities Comorbidity 1    Examination-Activity Limitations Locomotion Level;Bed Mobility    Examination-Participation Restrictions Yard Work;Cleaning;Interpersonal Relationship;Community Activity    Stability/Clinical Decision Making Stable/Uncomplicated    Rehab Potential Good    PT Frequency 1x / week    PT Duration 4 weeks    PT Treatment/Interventions Vestibular;Therapeutic activities;Therapeutic exercise;Balance training;Neuromuscular re-education;ADLs/Self Care Home Management;Patient/family education;Canalith Repostioning    PT Next Visit Plan recheck Lt Dix-Hallpike test - treat prn    PT Home Exercise Plan Brandt-Daroff exercises    Consulted and Agree with Plan of Care Patient           Patient will benefit from skilled therapeutic intervention in order to improve the following deficits and impairments:  Dizziness, Decreased balance, Difficulty walking  Visit Diagnosis: BPPV (benign paroxysmal positional vertigo), left     Problem List Patient Active Problem List   Diagnosis Date Noted  . Deviated nasal septum 04/01/2016  . Sinusitis, chronic 04/01/2016  . Prostate cancer ([place])   . Lateral meniscal tear 03/27/2015  . 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 [address] [place], [place], [address] Phone:  [phone]   Fax:  [phone]  Name: [name] MRN: [id number] Date of Birth: Oct 30, 1940

## 2020-02-27 DIAGNOSIS — Z7984 Long term (current) use of oral hypoglycemic drugs: Secondary | ICD-10-CM | POA: Diagnosis not present

## 2020-02-27 DIAGNOSIS — Z79899 Other long term (current) drug therapy: Secondary | ICD-10-CM | POA: Diagnosis not present

## 2020-02-27 DIAGNOSIS — E1169 Type 2 diabetes mellitus with other specified complication: Secondary | ICD-10-CM | POA: Diagnosis not present

## 2020-02-27 DIAGNOSIS — K591 Functional diarrhea: Secondary | ICD-10-CM | POA: Diagnosis not present

## 2020-02-27 DIAGNOSIS — I10 Essential (primary) hypertension: Secondary | ICD-10-CM | POA: Diagnosis not present

## 2020-04-17 DIAGNOSIS — Z23 Encounter for immunization: Secondary | ICD-10-CM | POA: Diagnosis not present

## 2020-06-21 DIAGNOSIS — H524 Presbyopia: Secondary | ICD-10-CM | POA: Diagnosis not present

## 2020-06-21 DIAGNOSIS — E119 Type 2 diabetes mellitus without complications: Secondary | ICD-10-CM | POA: Diagnosis not present

## 2020-06-21 DIAGNOSIS — H40053 Ocular hypertension, bilateral: Secondary | ICD-10-CM | POA: Diagnosis not present

## 2020-06-21 DIAGNOSIS — H40013 Open angle with borderline findings, low risk, bilateral: Secondary | ICD-10-CM | POA: Diagnosis not present

## 2020-08-26 DIAGNOSIS — I10 Essential (primary) hypertension: Secondary | ICD-10-CM | POA: Diagnosis not present

## 2020-08-26 DIAGNOSIS — Z0000 Encounter for general adult medical examination without abnormal findings: Secondary | ICD-10-CM | POA: Diagnosis not present

## 2020-08-26 DIAGNOSIS — Z7984 Long term (current) use of oral hypoglycemic drugs: Secondary | ICD-10-CM | POA: Diagnosis not present

## 2020-08-26 DIAGNOSIS — E7800 Pure hypercholesterolemia, unspecified: Secondary | ICD-10-CM | POA: Diagnosis not present

## 2020-08-26 DIAGNOSIS — E1169 Type 2 diabetes mellitus with other specified complication: Secondary | ICD-10-CM | POA: Diagnosis not present

## 2020-08-26 DIAGNOSIS — K219 Gastro-esophageal reflux disease without esophagitis: Secondary | ICD-10-CM | POA: Diagnosis not present

## 2020-08-26 DIAGNOSIS — Z1389 Encounter for screening for other disorder: Secondary | ICD-10-CM | POA: Diagnosis not present

## 2020-08-26 DIAGNOSIS — Z79899 Other long term (current) drug therapy: Secondary | ICD-10-CM | POA: Diagnosis not present

## 2020-08-26 DIAGNOSIS — Z23 Encounter for immunization: Secondary | ICD-10-CM | POA: Diagnosis not present

## 2020-08-26 DIAGNOSIS — Z8546 Personal history of malignant neoplasm of prostate: Secondary | ICD-10-CM | POA: Diagnosis not present

## 2020-08-26 DIAGNOSIS — D696 Thrombocytopenia, unspecified: Secondary | ICD-10-CM | POA: Diagnosis not present

## 2020-08-26 DIAGNOSIS — C8590 Non-Hodgkin lymphoma, unspecified, unspecified site: Secondary | ICD-10-CM | POA: Diagnosis not present

## 2020-10-23 DIAGNOSIS — I10 Essential (primary) hypertension: Secondary | ICD-10-CM | POA: Diagnosis not present

## 2020-10-23 DIAGNOSIS — M7062 Trochanteric bursitis, left hip: Secondary | ICD-10-CM | POA: Diagnosis not present

## 2020-10-23 DIAGNOSIS — E1169 Type 2 diabetes mellitus with other specified complication: Secondary | ICD-10-CM | POA: Diagnosis not present

## 2020-11-06 DIAGNOSIS — M7062 Trochanteric bursitis, left hip: Secondary | ICD-10-CM | POA: Insufficient documentation

## 2020-11-11 DIAGNOSIS — M7062 Trochanteric bursitis, left hip: Secondary | ICD-10-CM | POA: Diagnosis not present

## 2020-11-13 DIAGNOSIS — M7062 Trochanteric bursitis, left hip: Secondary | ICD-10-CM | POA: Diagnosis not present

## 2020-11-15 DIAGNOSIS — M7062 Trochanteric bursitis, left hip: Secondary | ICD-10-CM | POA: Diagnosis not present

## 2020-11-18 DIAGNOSIS — M7062 Trochanteric bursitis, left hip: Secondary | ICD-10-CM | POA: Diagnosis not present

## 2020-11-20 DIAGNOSIS — M7062 Trochanteric bursitis, left hip: Secondary | ICD-10-CM | POA: Diagnosis not present

## 2020-11-22 DIAGNOSIS — M7062 Trochanteric bursitis, left hip: Secondary | ICD-10-CM | POA: Diagnosis not present

## 2020-11-29 DIAGNOSIS — Z23 Encounter for immunization: Secondary | ICD-10-CM | POA: Diagnosis not present

## 2020-12-19 DIAGNOSIS — H40053 Ocular hypertension, bilateral: Secondary | ICD-10-CM | POA: Diagnosis not present

## 2020-12-26 DIAGNOSIS — M7062 Trochanteric bursitis, left hip: Secondary | ICD-10-CM | POA: Diagnosis not present

## 2020-12-27 ENCOUNTER — Ambulatory Visit: Payer: Medicare Other | Admitting: Oncology

## 2020-12-31 ENCOUNTER — Inpatient Hospital Stay: Payer: Medicare Other | Admitting: Oncology

## 2021-01-14 ENCOUNTER — Inpatient Hospital Stay: Payer: Medicare Other | Admitting: Oncology

## 2021-01-21 ENCOUNTER — Other Ambulatory Visit: Payer: Self-pay

## 2021-01-21 ENCOUNTER — Inpatient Hospital Stay: Payer: Medicare Other | Attending: Oncology | Admitting: Oncology

## 2021-01-21 VITALS — BP 126/72 | HR 61 | Temp 98.2°F | Resp 20 | Ht 71.0 in | Wt 216.6 lb

## 2021-01-21 DIAGNOSIS — C884 Extranodal marginal zone B-cell lymphoma of mucosa-associated lymphoid tissue [MALT-lymphoma]: Secondary | ICD-10-CM | POA: Diagnosis not present

## 2021-01-21 DIAGNOSIS — Z8546 Personal history of malignant neoplasm of prostate: Secondary | ICD-10-CM | POA: Diagnosis not present

## 2021-01-21 DIAGNOSIS — Z8572 Personal history of non-Hodgkin lymphomas: Secondary | ICD-10-CM | POA: Diagnosis not present

## 2021-01-21 DIAGNOSIS — I10 Essential (primary) hypertension: Secondary | ICD-10-CM | POA: Insufficient documentation

## 2021-01-21 DIAGNOSIS — Z79899 Other long term (current) drug therapy: Secondary | ICD-10-CM | POA: Insufficient documentation

## 2021-01-21 DIAGNOSIS — E119 Type 2 diabetes mellitus without complications: Secondary | ICD-10-CM | POA: Insufficient documentation

## 2021-01-21 DIAGNOSIS — E785 Hyperlipidemia, unspecified: Secondary | ICD-10-CM | POA: Diagnosis not present

## 2021-01-21 DIAGNOSIS — Z9079 Acquired absence of other genital organ(s): Secondary | ICD-10-CM | POA: Insufficient documentation

## 2021-01-21 NOTE — Progress Notes (Signed)
  [name] OFFICE PROGRESS NOTE   Diagnosis: Marginal zone lymphoma  INTERVAL HISTORY:   [name] returns as scheduled.  He feels well.  No fever or night sweats.  No palpable skin nodules.  Good appetite.  No complaint.  He reports intentional weight loss.  Objective:  Vital signs in last 24 hours:  Blood pressure 126/72, pulse 61, temperature 98.2 F (36.8 C), temperature source Oral, resp. rate 20, height 5\' 11"  (1.803 m), weight 216 lb 9.6 oz (98.2 kg), SpO2 99 %.     Lymphatics: No cervical, supraclavicular, axillary, or inguinal nodes Resp: Lungs with diminished breath sounds at the upper posterior chest bilaterally, no respiratory distress Cardio: Regular rate and rhythm GI: No hepatosplenomegaly Vascular: No leg edema  Skin: Scar posterior to the right ear without evidence of recurrent tumor   Medications: I have reviewed the patient's current medications.   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 Prostate cancer diagnosed in 2009, status post prostatectomy   3.   Diabetes   4.   Hypertension   5.   hyperlipidemiq   6.  Zoster rash right forehead/periorbital region April 2018  7.  Positional vertigo June 2021   Disposition: [name] remains in remission from the marginal zone lymphoma.  He would like to continue follow-up at the Cancer center.  He will return for an office visit in 1 year.  Betsy Coder, MD  01/21/2021  9:22 AM

## 2021-02-12 DIAGNOSIS — L821 Other seborrheic keratosis: Secondary | ICD-10-CM | POA: Diagnosis not present

## 2021-02-12 DIAGNOSIS — D485 Neoplasm of uncertain behavior of skin: Secondary | ICD-10-CM | POA: Diagnosis not present

## 2021-02-12 DIAGNOSIS — Z85828 Personal history of other malignant neoplasm of skin: Secondary | ICD-10-CM | POA: Diagnosis not present

## 2021-02-12 DIAGNOSIS — D1801 Hemangioma of skin and subcutaneous tissue: Secondary | ICD-10-CM | POA: Diagnosis not present

## 2021-02-12 DIAGNOSIS — D3613 Benign neoplasm of peripheral nerves and autonomic nervous system of lower limb, including hip: Secondary | ICD-10-CM | POA: Diagnosis not present

## 2021-02-12 DIAGNOSIS — D2271 Melanocytic nevi of right lower limb, including hip: Secondary | ICD-10-CM | POA: Diagnosis not present

## 2021-02-28 DIAGNOSIS — I10 Essential (primary) hypertension: Secondary | ICD-10-CM | POA: Diagnosis not present

## 2021-02-28 DIAGNOSIS — Z79899 Other long term (current) drug therapy: Secondary | ICD-10-CM | POA: Diagnosis not present

## 2021-02-28 DIAGNOSIS — E1169 Type 2 diabetes mellitus with other specified complication: Secondary | ICD-10-CM | POA: Diagnosis not present

## 2021-03-10 DIAGNOSIS — M545 Low back pain, unspecified: Secondary | ICD-10-CM | POA: Diagnosis not present

## 2021-03-10 DIAGNOSIS — M25552 Pain in left hip: Secondary | ICD-10-CM | POA: Diagnosis not present

## 2021-03-27 DIAGNOSIS — I129 Hypertensive chronic kidney disease with stage 1 through stage 4 chronic kidney disease, or unspecified chronic kidney disease: Secondary | ICD-10-CM | POA: Diagnosis not present

## 2021-03-27 DIAGNOSIS — M5431 Sciatica, right side: Secondary | ICD-10-CM | POA: Diagnosis not present

## 2021-03-27 DIAGNOSIS — Z23 Encounter for immunization: Secondary | ICD-10-CM | POA: Diagnosis not present

## 2021-03-27 DIAGNOSIS — N1831 Chronic kidney disease, stage 3a: Secondary | ICD-10-CM | POA: Diagnosis not present

## 2021-03-27 DIAGNOSIS — M5432 Sciatica, left side: Secondary | ICD-10-CM | POA: Diagnosis not present

## 2021-03-27 DIAGNOSIS — E1121 Type 2 diabetes mellitus with diabetic nephropathy: Secondary | ICD-10-CM | POA: Diagnosis not present

## 2021-04-17 DIAGNOSIS — M5416 Radiculopathy, lumbar region: Secondary | ICD-10-CM | POA: Diagnosis not present

## 2021-04-25 DIAGNOSIS — M79604 Pain in right leg: Secondary | ICD-10-CM | POA: Diagnosis not present

## 2021-04-25 DIAGNOSIS — M79605 Pain in left leg: Secondary | ICD-10-CM | POA: Diagnosis not present

## 2021-04-25 DIAGNOSIS — M5416 Radiculopathy, lumbar region: Secondary | ICD-10-CM | POA: Diagnosis not present

## 2021-04-25 DIAGNOSIS — M6281 Muscle weakness (generalized): Secondary | ICD-10-CM | POA: Diagnosis not present

## 2021-04-28 DIAGNOSIS — M79605 Pain in left leg: Secondary | ICD-10-CM | POA: Diagnosis not present

## 2021-04-28 DIAGNOSIS — M5416 Radiculopathy, lumbar region: Secondary | ICD-10-CM | POA: Diagnosis not present

## 2021-04-28 DIAGNOSIS — M6281 Muscle weakness (generalized): Secondary | ICD-10-CM | POA: Diagnosis not present

## 2021-04-28 DIAGNOSIS — M79604 Pain in right leg: Secondary | ICD-10-CM | POA: Diagnosis not present

## 2021-05-01 DIAGNOSIS — M6281 Muscle weakness (generalized): Secondary | ICD-10-CM | POA: Diagnosis not present

## 2021-05-01 DIAGNOSIS — M5416 Radiculopathy, lumbar region: Secondary | ICD-10-CM | POA: Diagnosis not present

## 2021-05-01 DIAGNOSIS — M79604 Pain in right leg: Secondary | ICD-10-CM | POA: Diagnosis not present

## 2021-05-01 DIAGNOSIS — M79605 Pain in left leg: Secondary | ICD-10-CM | POA: Diagnosis not present

## 2021-05-05 DIAGNOSIS — M5416 Radiculopathy, lumbar region: Secondary | ICD-10-CM | POA: Diagnosis not present

## 2021-05-05 DIAGNOSIS — M6281 Muscle weakness (generalized): Secondary | ICD-10-CM | POA: Diagnosis not present

## 2021-05-05 DIAGNOSIS — M79604 Pain in right leg: Secondary | ICD-10-CM | POA: Diagnosis not present

## 2021-05-05 DIAGNOSIS — M79605 Pain in left leg: Secondary | ICD-10-CM | POA: Diagnosis not present

## 2021-05-08 DIAGNOSIS — M6281 Muscle weakness (generalized): Secondary | ICD-10-CM | POA: Diagnosis not present

## 2021-05-08 DIAGNOSIS — M79605 Pain in left leg: Secondary | ICD-10-CM | POA: Diagnosis not present

## 2021-05-08 DIAGNOSIS — M79604 Pain in right leg: Secondary | ICD-10-CM | POA: Diagnosis not present

## 2021-05-08 DIAGNOSIS — M5416 Radiculopathy, lumbar region: Secondary | ICD-10-CM | POA: Diagnosis not present

## 2021-05-12 ENCOUNTER — Other Ambulatory Visit: Payer: Self-pay

## 2021-05-12 ENCOUNTER — Other Ambulatory Visit: Payer: Self-pay | Admitting: Sports Medicine

## 2021-05-12 ENCOUNTER — Ambulatory Visit
Admission: RE | Admit: 2021-05-12 | Discharge: 2021-05-12 | Disposition: A | Discharge status: Home / Self Care | Payer: Medicare Other | Source: Ambulatory Visit | Attending: Sports Medicine | Admitting: Sports Medicine

## 2021-05-12 DIAGNOSIS — M5442 Lumbago with sciatica, left side: Secondary | ICD-10-CM | POA: Diagnosis not present

## 2021-05-12 DIAGNOSIS — M545 Low back pain, unspecified: Secondary | ICD-10-CM | POA: Diagnosis not present

## 2021-05-12 DIAGNOSIS — M5441 Lumbago with sciatica, right side: Secondary | ICD-10-CM | POA: Diagnosis not present

## 2021-05-13 DIAGNOSIS — M5416 Radiculopathy, lumbar region: Secondary | ICD-10-CM | POA: Diagnosis not present

## 2021-05-13 DIAGNOSIS — M79605 Pain in left leg: Secondary | ICD-10-CM | POA: Diagnosis not present

## 2021-05-13 DIAGNOSIS — M6281 Muscle weakness (generalized): Secondary | ICD-10-CM | POA: Diagnosis not present

## 2021-05-13 DIAGNOSIS — M79604 Pain in right leg: Secondary | ICD-10-CM | POA: Diagnosis not present

## 2021-05-14 ENCOUNTER — Other Ambulatory Visit: Payer: Self-pay | Admitting: Sports Medicine

## 2021-05-14 DIAGNOSIS — M5441 Lumbago with sciatica, right side: Secondary | ICD-10-CM

## 2021-05-14 DIAGNOSIS — M5442 Lumbago with sciatica, left side: Secondary | ICD-10-CM

## 2021-05-15 DIAGNOSIS — M6281 Muscle weakness (generalized): Secondary | ICD-10-CM | POA: Diagnosis not present

## 2021-05-15 DIAGNOSIS — M5416 Radiculopathy, lumbar region: Secondary | ICD-10-CM | POA: Diagnosis not present

## 2021-05-15 DIAGNOSIS — M79604 Pain in right leg: Secondary | ICD-10-CM | POA: Diagnosis not present

## 2021-05-15 DIAGNOSIS — M79605 Pain in left leg: Secondary | ICD-10-CM | POA: Diagnosis not present

## 2021-05-30 DIAGNOSIS — I10 Essential (primary) hypertension: Secondary | ICD-10-CM | POA: Diagnosis not present

## 2021-06-01 ENCOUNTER — Other Ambulatory Visit: Payer: Medicare Other

## 2021-06-04 ENCOUNTER — Other Ambulatory Visit: Payer: Self-pay

## 2021-06-04 ENCOUNTER — Ambulatory Visit
Admission: RE | Admit: 2021-06-04 | Discharge: 2021-06-04 | Disposition: A | Discharge status: Home / Self Care | Payer: Medicare Other | Source: Ambulatory Visit | Attending: Sports Medicine | Admitting: Sports Medicine

## 2021-06-04 DIAGNOSIS — M47816 Spondylosis without myelopathy or radiculopathy, lumbar region: Secondary | ICD-10-CM | POA: Diagnosis not present

## 2021-06-04 DIAGNOSIS — M5442 Lumbago with sciatica, left side: Secondary | ICD-10-CM

## 2021-06-04 DIAGNOSIS — M5441 Lumbago with sciatica, right side: Secondary | ICD-10-CM

## 2021-06-04 DIAGNOSIS — M48061 Spinal stenosis, lumbar region without neurogenic claudication: Secondary | ICD-10-CM | POA: Diagnosis not present

## 2021-06-04 DIAGNOSIS — M545 Low back pain, unspecified: Secondary | ICD-10-CM | POA: Diagnosis not present

## 2021-06-17 DIAGNOSIS — M48062 Spinal stenosis, lumbar region with neurogenic claudication: Secondary | ICD-10-CM | POA: Diagnosis not present

## 2021-06-17 DIAGNOSIS — I10 Essential (primary) hypertension: Secondary | ICD-10-CM | POA: Diagnosis not present

## 2021-06-17 DIAGNOSIS — M47816 Spondylosis without myelopathy or radiculopathy, lumbar region: Secondary | ICD-10-CM | POA: Diagnosis not present

## 2021-06-17 DIAGNOSIS — Z6831 Body mass index (BMI) 31.0-31.9, adult: Secondary | ICD-10-CM | POA: Diagnosis not present

## 2021-06-19 DIAGNOSIS — H40053 Ocular hypertension, bilateral: Secondary | ICD-10-CM | POA: Diagnosis not present

## 2021-06-19 DIAGNOSIS — Z961 Presence of intraocular lens: Secondary | ICD-10-CM | POA: Diagnosis not present

## 2021-06-25 DIAGNOSIS — M48062 Spinal stenosis, lumbar region with neurogenic claudication: Secondary | ICD-10-CM | POA: Diagnosis not present

## 2021-07-25 DIAGNOSIS — M25531 Pain in right wrist: Secondary | ICD-10-CM | POA: Diagnosis not present

## 2021-07-25 DIAGNOSIS — S60221A Contusion of right hand, initial encounter: Secondary | ICD-10-CM | POA: Diagnosis not present

## 2021-08-05 DIAGNOSIS — M25531 Pain in right wrist: Secondary | ICD-10-CM | POA: Diagnosis not present

## 2021-08-12 DIAGNOSIS — M48062 Spinal stenosis, lumbar region with neurogenic claudication: Secondary | ICD-10-CM | POA: Diagnosis not present

## 2021-08-29 DIAGNOSIS — I129 Hypertensive chronic kidney disease with stage 1 through stage 4 chronic kidney disease, or unspecified chronic kidney disease: Secondary | ICD-10-CM | POA: Diagnosis not present

## 2021-08-29 DIAGNOSIS — E1121 Type 2 diabetes mellitus with diabetic nephropathy: Secondary | ICD-10-CM | POA: Diagnosis not present

## 2021-08-29 DIAGNOSIS — Z79899 Other long term (current) drug therapy: Secondary | ICD-10-CM | POA: Diagnosis not present

## 2021-08-29 DIAGNOSIS — E1169 Type 2 diabetes mellitus with other specified complication: Secondary | ICD-10-CM | POA: Diagnosis not present

## 2021-08-29 DIAGNOSIS — Z0000 Encounter for general adult medical examination without abnormal findings: Secondary | ICD-10-CM | POA: Diagnosis not present

## 2021-08-29 DIAGNOSIS — Z7984 Long term (current) use of oral hypoglycemic drugs: Secondary | ICD-10-CM | POA: Diagnosis not present

## 2021-08-29 DIAGNOSIS — Z1389 Encounter for screening for other disorder: Secondary | ICD-10-CM | POA: Diagnosis not present

## 2021-08-29 DIAGNOSIS — E7800 Pure hypercholesterolemia, unspecified: Secondary | ICD-10-CM | POA: Diagnosis not present

## 2021-08-29 DIAGNOSIS — Z8546 Personal history of malignant neoplasm of prostate: Secondary | ICD-10-CM | POA: Diagnosis not present

## 2021-08-29 DIAGNOSIS — K219 Gastro-esophageal reflux disease without esophagitis: Secondary | ICD-10-CM | POA: Diagnosis not present

## 2021-08-29 DIAGNOSIS — C8590 Non-Hodgkin lymphoma, unspecified, unspecified site: Secondary | ICD-10-CM | POA: Diagnosis not present

## 2021-08-29 DIAGNOSIS — D696 Thrombocytopenia, unspecified: Secondary | ICD-10-CM | POA: Diagnosis not present

## 2021-08-29 DIAGNOSIS — I700 Atherosclerosis of aorta: Secondary | ICD-10-CM | POA: Diagnosis not present

## 2021-09-08 DIAGNOSIS — M5431 Sciatica, right side: Secondary | ICD-10-CM | POA: Diagnosis not present

## 2021-09-08 DIAGNOSIS — M5432 Sciatica, left side: Secondary | ICD-10-CM | POA: Diagnosis not present

## 2021-09-08 DIAGNOSIS — R269 Unspecified abnormalities of gait and mobility: Secondary | ICD-10-CM | POA: Diagnosis not present

## 2021-09-08 DIAGNOSIS — R2689 Other abnormalities of gait and mobility: Secondary | ICD-10-CM | POA: Diagnosis not present

## 2021-10-21 DIAGNOSIS — M47816 Spondylosis without myelopathy or radiculopathy, lumbar region: Secondary | ICD-10-CM | POA: Diagnosis not present

## 2021-10-29 DIAGNOSIS — I10 Essential (primary) hypertension: Secondary | ICD-10-CM | POA: Diagnosis not present

## 2021-10-29 DIAGNOSIS — M5432 Sciatica, left side: Secondary | ICD-10-CM | POA: Diagnosis not present

## 2021-10-31 DIAGNOSIS — M47816 Spondylosis without myelopathy or radiculopathy, lumbar region: Secondary | ICD-10-CM | POA: Diagnosis not present

## 2021-10-31 DIAGNOSIS — M79604 Pain in right leg: Secondary | ICD-10-CM | POA: Diagnosis not present

## 2021-10-31 DIAGNOSIS — M79605 Pain in left leg: Secondary | ICD-10-CM | POA: Diagnosis not present

## 2021-11-04 ENCOUNTER — Telehealth: Payer: Self-pay | Admitting: Physical Medicine and Rehabilitation

## 2021-11-04 NOTE — Telephone Encounter (Signed)
Pt called and states his doctor (Dr.Alexander) faxed [place] a referral for this pt.  ? ?Cb [phone]  ?

## 2021-11-05 ENCOUNTER — Encounter: Payer: Self-pay | Admitting: Neurology

## 2021-11-05 ENCOUNTER — Ambulatory Visit (INDEPENDENT_AMBULATORY_CARE_PROVIDER_SITE_OTHER): Payer: Medicare Other | Admitting: Neurology

## 2021-11-05 ENCOUNTER — Ambulatory Visit
Admission: RE | Admit: 2021-11-05 | Discharge: 2021-11-05 | Disposition: A | Discharge status: Home / Self Care | Payer: Medicare Other | Source: Ambulatory Visit | Attending: Neurology | Admitting: Neurology

## 2021-11-05 VITALS — BP 170/94 | HR 62 | Ht 71.0 in | Wt 226.5 lb

## 2021-11-05 DIAGNOSIS — M79651 Pain in right thigh: Secondary | ICD-10-CM | POA: Diagnosis not present

## 2021-11-05 DIAGNOSIS — Z8546 Personal history of malignant neoplasm of prostate: Secondary | ICD-10-CM | POA: Insufficient documentation

## 2021-11-05 DIAGNOSIS — M5442 Lumbago with sciatica, left side: Secondary | ICD-10-CM

## 2021-11-05 DIAGNOSIS — G8929 Other chronic pain: Secondary | ICD-10-CM

## 2021-11-05 DIAGNOSIS — M533 Sacrococcygeal disorders, not elsewhere classified: Secondary | ICD-10-CM | POA: Diagnosis not present

## 2021-11-05 DIAGNOSIS — R269 Unspecified abnormalities of gait and mobility: Secondary | ICD-10-CM

## 2021-11-05 DIAGNOSIS — R9389 Abnormal findings on diagnostic imaging of other specified body structures: Secondary | ICD-10-CM

## 2021-11-05 DIAGNOSIS — D696 Thrombocytopenia, unspecified: Secondary | ICD-10-CM | POA: Insufficient documentation

## 2021-11-05 DIAGNOSIS — M5441 Lumbago with sciatica, right side: Secondary | ICD-10-CM

## 2021-11-05 DIAGNOSIS — I700 Atherosclerosis of aorta: Secondary | ICD-10-CM | POA: Insufficient documentation

## 2021-11-05 DIAGNOSIS — E1121 Type 2 diabetes mellitus with diabetic nephropathy: Secondary | ICD-10-CM | POA: Insufficient documentation

## 2021-11-05 DIAGNOSIS — M47816 Spondylosis without myelopathy or radiculopathy, lumbar region: Secondary | ICD-10-CM | POA: Insufficient documentation

## 2021-11-05 DIAGNOSIS — K219 Gastro-esophageal reflux disease without esophagitis: Secondary | ICD-10-CM | POA: Insufficient documentation

## 2021-11-05 DIAGNOSIS — E1169 Type 2 diabetes mellitus with other specified complication: Secondary | ICD-10-CM | POA: Insufficient documentation

## 2021-11-05 DIAGNOSIS — M5430 Sciatica, unspecified side: Secondary | ICD-10-CM | POA: Insufficient documentation

## 2021-11-05 DIAGNOSIS — M47819 Spondylosis without myelopathy or radiculopathy, site unspecified: Secondary | ICD-10-CM | POA: Insufficient documentation

## 2021-11-05 DIAGNOSIS — N1831 Chronic kidney disease, stage 3a: Secondary | ICD-10-CM | POA: Insufficient documentation

## 2021-11-05 DIAGNOSIS — C8590 Non-Hodgkin lymphoma, unspecified, unspecified site: Secondary | ICD-10-CM | POA: Insufficient documentation

## 2021-11-05 DIAGNOSIS — E7800 Pure hypercholesterolemia, unspecified: Secondary | ICD-10-CM | POA: Insufficient documentation

## 2021-11-05 NOTE — Patient Instructions (Signed)
[facility]    Address: [address], , [place] [address]  Phone: [phone]   

## 2021-11-05 NOTE — Progress Notes (Signed)
? ?Chief Complaint  ?Patient presents with  ? Follow-up  ?  Rm 13. Accompanied by wife, [name]. ?NP/[employer]/[facility]/Dakota Stoneking MD/gait disorder, progressive weakness x 2 mths, tremor in L arm.  ? ? ? ? ?ASSESSMENT AND PLAN ? ?[name] is a 81 y.o. male   ?Chronic low back pain, ? MRI of lumbar spine multilevel degenerative changes, severe spinal stenosis at L3-4, variable degree of foraminal narrowing, ? Is already under pain management at [name], reported transient improvement with epidural injection, will continue care there ? Physical therapy ? ?Significant bilateral SI joints pain upon deep palpitation ? X-ray to rule out SI joint structural abnormality ? Laboratory evaluation for inflammatory markers ? ? ?DIAGNOSTIC DATA (LABS, IMAGING, TESTING) ?- I reviewed patient records, labs, notes, testing and imaging myself where available. ? ?Laboratory evaluation February 2023, triglyceride 124, LDL 73 A1c 7.2, normal CMP, CBC with hemoglobin of 13.0, A1c 7.2, ? ?MEDICAL HISTORY: ? ?[name], is a 81 year old male seen in request by his primary care physician Dr. Felipa Moon, [name], for evaluation of hand tremor, he is accompanied by his wife at today's visit on November 05, 2021 ? ?I reviewed and summarized the referring note. PMHX. 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 ? ?He has long history of chronic low back pain, has lumbar decompression surgery in the past, which did help his symptoms some, ? ?He began to noticed worsening low back pain since January 2023, radiating down to bilateral buttock, and posterior thigh, getting worse with change positions such as getting up and down ? ?He has been under pain management Dr. Brien Moon over the past Moon months, had injection to lower lumbar region, will is only good for 3 weeks ? ?Personally reviewed MRI of lumbar  without contrast November 2022, multilevel degenerative changes, significant spinal stenosis L3-4, variable degree of foraminal narrowing, ? ?He has started physical therapy recently, which seems to help him some, he denies bowel and bladder incontinence denied persistent lower extremity muscle weakness ? ?  ?PHYSICAL EXAM: ?  ?Vitals:  ? 11/05/21 1138  ?BP: (!) 170/94  ?Pulse: 62  ?Weight: 226 lb 8 oz (102.7 kg)  ?Height: '5\' 11"'$  (1.803 m)  ? ?Not recorded ?  ? ? ?Body mass index is 31.59 kg/m?. ? ?PHYSICAL EXAMNIATION: ? ?Gen: NAD, conversant, well nourised, well groomed                     ?Cardiovascular: Regular rate rhythm, no peripheral edema, warm, nontender. ?Eyes: Conjunctivae clear without exudates or hemorrhage ?Neck: Supple, no carotid bruits. ?Pulmonary: Clear to auscultation bilaterally  ? ?NEUROLOGICAL EXAM: ? ?MENTAL STATUS: ?Speech/cognition ?Awake, alert, oriented to history taking care of conversation ?CRANIAL NERVES: ?CN II: Visual fields are full to confrontation. Pupils are round equal and briskly reactive to light. ?CN III, IV, VI: extraocular movement are normal. No ptosis. ?CN V: Facial sensation is intact to light touch ?CN VII: Face is symmetric with normal eye closure  ?CN VIII: Hearing is normal to causal conversation. ?CN IX, X: Phonation is normal. ?CN XI: Head turning and shoulder shrug are intact ? ?MOTOR: ?There is no pronator drift of out-stretched arms. Muscle bulk and tone are normal. Muscle strength is normal.  Significant bilateral SI joints pain ? ?REFLEXES: ?Reflexes are 1 and symmetric at the biceps, triceps, knees, and ankles. Plantar responses are flexor. ? ?  SENSORY: ?Intact to light touch, pinprick and vibratory sensation are intact in fingers and toes. ? ?COORDINATION: ?There is no trunk or limb dysmetria noted. ? ?GAIT/STANCE: Need push-up to get up from seated position, wide-based, antalgic, ? ?REVIEW OF SYSTEMS:  ?Full 14 system review of systems performed and notable  only for as above ?All other review of systems were negative. ? ? ?ALLERGIES: ?Allergies  ?Allergen Reactions  ? Atorvastatin   ?  Other reaction(s): thrombocytopenia  ? Tizanidine Hcl   ?  Other reaction(s): dizziness  ? ? ?HOME MEDICATIONS: ?Current Outpatient Medications  ?Medication Sig Dispense Refill  ? amLODipine (NORVASC) 10 MG tablet Take 10 mg by mouth every morning.     ? atenolol (TENORMIN) 50 MG tablet Take 25 mg by mouth every morning.     ? hydrochlorothiazide (HYDRODIURIL) 25 MG tablet Take 12.5 mg by mouth every morning.     ? latanoprost (XALATAN) 0.005 % ophthalmic solution Place 1 drop into both eyes daily as needed.   11  ? lisinopril (PRINIVIL,ZESTRIL) 20 MG tablet Take 20 mg by mouth every morning.     ? Melatonin 3 MG TABS Take 1 tablet by mouth at bedtime as needed.    ? metFORMIN (GLUCOPHAGE) 500 MG tablet Take 1,000 mg by mouth daily. Reported 01/21/21    ? pantoprazole (PROTONIX) 40 MG tablet Take 40 mg by mouth daily as needed (heart burn). Reported on 09/12/2015  6  ? rosuvastatin (CRESTOR) 10 MG tablet Take 10 mg by mouth at bedtime.    ? sodium chloride (OCEAN) 0.65 % SOLN nasal spray Place 1 spray into both nostrils as needed for congestion.    ? spironolactone (ALDACTONE) 25 MG tablet Take 25 mg by mouth daily.    ? traMADol (ULTRAM) 50 MG tablet Take 50 mg by mouth every 6 (six) hours as needed for moderate pain. Reported on 09/12/2015  0  ? ?No current facility-administered medications for this visit.  ? ? ?PAST MEDICAL HISTORY: ?Past Medical History:  ?Diagnosis Date  ? Acute meniscal tear of right knee   ? Arthritis   ? Cancer [facility]) 2015  ? 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 Cancer [facility]) 2010  ? prostate  ? Deviated septum   ? chronic sinusitis, nasal turbinate hypertrophy  ? Diabetes mellitus without complication ([place])   ? on metformin  ? GERD (gastroesophageal reflux disease)   ? Hypertension   ? Pneumonia   ? Sleep apnea   ? wears CPAP  ? Wears glasses   ? ? ?PAST  SURGICAL HISTORY: ?Past Surgical History:  ?Procedure Laterality Date  ? back injection    ? to lower back  ? BACK SURGERY    ? lower back  ? KNEE ARTHROSCOPY Right 03/27/2015  ? Procedure: RIGHT ARTHROSCOPY KNEE WITH DEBRIDEMENT, PARTIAL LATERAL MENISCECTOMY;  Surgeon: Gaynelle Arabian, MD;  Location: [facility];  Service: Orthopedics;  Laterality: Right;  ? NASAL SEPTOPLASTY W/ TURBINOPLASTY Bilateral 04/01/2016  ? Procedure: NASAL SEPTOPLASTY WITH BILATERAL INFERIOR  TURBINATE REDUCTION;  Surgeon: Jerrell Belfast, MD;  Location: [place];  Service: ENT;  Laterality: Bilateral;  ? PROSTATECTOMY  2010  ? SINUS ENDO WITH FUSION Right 04/01/2016  ? Procedure: RIGHT ENDOSCOPIC SINUS SURGERY,AND CONSISTING  OF ANTERIOR ETHMOIDECTOMY;  Surgeon: Jerrell Belfast, MD;  Location: [place];  Service: ENT;  Laterality: Right;  ? surgery for collapsed lung Left 35 to 40 years ago  ? ? ?FAMILY HISTORY: ?Family History  ?Problem Relation  Age of Onset  ? Heart attack Father   ? Heart attack Sister   ? ? ?SOCIAL HISTORY: ?Social History  ? ?Socioeconomic History  ? Marital status: Married  ?  Spouse name: Not on file  ? Number of children: Not on file  ? Years of education: Not on file  ? Highest education level: Not on file  ?Occupational History  ? Not on file  ?Tobacco Use  ? Smoking status: Former  ?  Packs/day: 1.00  ?  Years: 20.00  ?  Pack years: 20.00  ?  Types: Cigarettes  ? Smokeless tobacco: Never  ? Tobacco comments:  ?  quit smoking 24 years ago 03/30/16  ?Substance and Sexual Activity  ? Alcohol use: No  ? Drug use: No  ? Sexual activity: Not on file  ?Other Topics Concern  ? Not on file  ?Social History Narrative  ? Not on file  ? ?Social Determinants of Health  ? ?Financial Resource Strain: Not on file  ?Food Insecurity: Not on file  ?Transportation Needs: Not on file  ?Physical Activity: Not on file  ?Stress: Not on file  ?Social Connections: Not on file  ?Intimate Partner Violence: Not on file  ? ? ? ? ?[name], M.D.  Ph.D. ? ?[facility] ?[address], [address] ?[place], [place] [address] ?Ph: [phone] ?Fax: [phone] ? ?CC:  Lajean Manes, MD ?[address] ?[address] ?[place],  [place] [address]  St

## 2021-11-06 LAB — CK: Total CK: 62 U/L (ref 30–208)

## 2021-11-06 LAB — SEDIMENTATION RATE: Sed Rate: 10 mm/hr (ref 0–30)

## 2021-11-06 LAB — TSH: TSH: 1.79 u[IU]/mL (ref 0.450–4.500)

## 2021-11-06 LAB — RHEUMATOID FACTOR: Rhuematoid fact SerPl-aCnc: <10 IU/mL (ref <14.0)

## 2021-11-06 LAB — ANA W/REFLEX IF POSITIVE: Anti Nuclear Antibody (ANA): NEGATIVE

## 2021-11-06 LAB — C-REACTIVE PROTEIN: CRP: <1 mg/L (ref 0–10)

## 2021-11-07 ENCOUNTER — Encounter: Payer: Self-pay | Admitting: Physical Medicine and Rehabilitation

## 2021-11-07 ENCOUNTER — Ambulatory Visit (INDEPENDENT_AMBULATORY_CARE_PROVIDER_SITE_OTHER): Payer: Medicare Other | Admitting: Physical Medicine and Rehabilitation

## 2021-11-07 VITALS — BP 163/78 | HR 62

## 2021-11-07 DIAGNOSIS — R269 Unspecified abnormalities of gait and mobility: Secondary | ICD-10-CM

## 2021-11-07 DIAGNOSIS — M5416 Radiculopathy, lumbar region: Secondary | ICD-10-CM | POA: Diagnosis not present

## 2021-11-07 DIAGNOSIS — M48062 Spinal stenosis, lumbar region with neurogenic claudication: Secondary | ICD-10-CM

## 2021-11-07 DIAGNOSIS — M961 Postlaminectomy syndrome, not elsewhere classified: Secondary | ICD-10-CM | POA: Diagnosis not present

## 2021-11-07 DIAGNOSIS — M4726 Other spondylosis with radiculopathy, lumbar region: Secondary | ICD-10-CM | POA: Diagnosis not present

## 2021-11-07 NOTE — Progress Notes (Signed)
? ?DENT PLANTZ - 81 y.o. male MRN [id number]  Date of birth: 11/25/1940 ? ?Office Visit Note: ?Visit Date: 11/07/2021 ?PCP: Lajean Manes, MD ?Referred by: Lajean Manes, MD ? ?Subjective: ?Chief Complaint  ?Patient presents with  ? Lower Back - Pain  ? Left Thigh - Pain  ? Right Thigh - Pain  ? ?HPI: [name] is a 81 y.o. male who comes in today at the request of Dr. Inez Catalina for evaluation of bilateral lower back pain radiating down to both legs.  Patient reports pain has been ongoing for several months and is exacerbated by prolonged standing and walking.  Patient describes pain as a constant sore, aching and tingling sensation.  Patient states he does experience intermittent numbness/tingling to bilateral legs.  Patient currently rates pain as 8 out of 10.  Patient reports some relief of pain with home exercise regimen, rest and use of medications. Patient recently completed regimen of formal physical therapy at [facility] and reports no relief of pain with these treatments. Patients lumbar MRI from 2022 exhibits prior left laminectomy at L2-L3, severe spinal canal stenosis at L3-L4 and moderate at L4-L5. Patient was previously treated by Dr. Marlaine Hind at [facility], patient states he had three lumbar epidural injections with some relief lasting about 3 weeks. Patient also has history of cervical epidural steroid injections with Dr. Brien Few. Patient is currently using cane to assist with ambulation and prevent falls. Patient reports his severe pain is negatively impacting his daily life. Patient denies focal weakness. Patient denies recent trauma or falls.  ? ?Review of Systems  ?Musculoskeletal:  Positive for back pain.  ?Neurological:  Positive for tingling. Negative for focal weakness and weakness.  ?All other systems reviewed and are negative. Otherwise per HPI. ? ?Assessment & Plan: ?Visit Diagnoses:  ?  ICD-10-CM   ?1. Lumbar radiculopathy  M54.16  Ambulatory referral to Physical Medicine Rehab  ?  ?2. Other spondylosis with radiculopathy, lumbar region  M47.26 Ambulatory referral to Physical Medicine Rehab  ?  ?3. Spinal stenosis of lumbar region with neurogenic claudication  M48.062 Ambulatory referral to Physical Medicine Rehab  ?  ?4. Post laminectomy syndrome  M96.1 Ambulatory referral to Physical Medicine Rehab  ?  ?5. Gait disturbance  R26.9 Ambulatory referral to Physical Medicine Rehab  ?  ?   ?Plan: Findings:  ?Chronic, worsening and severe bilateral lower back pain radiating down bilateral legs.  Patient continues to have severe and debilitating pain despite good conservative therapy such as formal physical therapy, home exercise regimen, rest and use of medications.  Patient's clinical presentation and exam are consistent with neurogenic claudication as a result of spinal canal stenosis.  He does have severe spinal canal stenosis noted at the level of L3-L4.  We believe the next step is to perform a diagnostic and hopefully therapeutic right L4-L5 interlaminar epidural steroid injection under fluoroscopic guidance.  Patient is not currently undergoing long-term anticoagulant therapy.  We are not able to see office notes/procedure notes from Dr. Lauris Poag office, I will have patient fill out medical release form before leaving today.  Patient encouraged to remain active and to continue home exercise regimen as tolerated.  Patient encouraged to continue using cane to assist with ambulation and prevent falls.  No red flag symptoms noted upon exam today.  ? ?Meds & Orders: No orders of the defined types were placed in this encounter. ?  ?Orders Placed This Encounter  ?Procedures  ? Ambulatory referral  to Physical Medicine Rehab  ?  ?Follow-up: Return for Right L4-L5 interlaminar epidural steroid injection.  ? ?Procedures: ?No procedures performed  ?   ? ?Clinical History: ?EXAM: ?MRI LUMBAR SPINE WITHOUT CONTRAST ?  ?TECHNIQUE: ?Multiplanar,  multisequence MR imaging of the lumbar spine was ?performed. No intravenous contrast was administered. ?  ?COMPARISON:  X-ray lumbar 05/12/2021; MR lumbar 01/13/2014. ?  ?FINDINGS: ?Segmentation:  Standard. ?  ?Alignment:  Grade 1 anterolisthesis L3 on L4-L4 on L5. ?  ?Vertebrae: No acute fracture, evidence of discitis, or aggressive ?bone lesion. ?  ?Conus medullaris and cauda equina: Conus extends to the T12-L1 ?level. Conus and cauda equina appear normal. ?  ?Paraspinal and other soft tissues: No acute paraspinal abnormality. ?  ?Disc levels: ?  ?Disc spaces: Disc desiccation throughout lumbar spine with relative ?sparing at L5-S1. ?  ?T12-L1: Mild broad-based disc bulge with a small central disc ?protrusion. No foraminal or central canal stenosis. ?  ?L1-L2: Minimal broad-based disc bulge. No foraminal or central canal ?stenosis. ?  ?L2-L3: Mild broad-based disc bulge with a broad right paracentral ?disc protrusion. Moderate right and moderate-severe left foraminal ?stenosis. Mild spinal stenosis. Prior left laminectomy. ?  ?L3-L4: Broad-based disc bulge flattening the ventral thecal sac. ?Moderate bilateral facet arthropathy. Severe spinal stenosis. ?Moderate bilateral foraminal stenosis. ?  ?L4-L5: Broad-based disc bulge. Severe bilateral facet arthropathy. ?Mild spinal stenosis scratch them moderate spinal stenosis. ?Moderate-severe bilateral foraminal stenosis. ?  ?L5-S1: No significant disc bulge. No neural foraminal stenosis. No ?central canal stenosis. ?  ?IMPRESSION: ?1. Diffuse lumbar spine spondylosis as described above which has ?progressed compared with 01/13/2014. ?2.  No acute osseous injury of the lumbar spine. ?  ?  ?Electronically Signed ?  By: [name] M.D. ?  On: 06/04/2021 15:50  ? ?He reports that he has quit smoking. His smoking use included cigarettes. He has a 20.00 pack-year smoking history. He has never used smokeless tobacco. No results for input(s): HGBA1C, LABURIC in the last  8760 hours. ? ?Objective:  VS:  HT:    WT:   BMI:     BP:(!) 163/78  HR:62bpm  TEMP: ( )  RESP:  ?Physical Exam ?Vitals and nursing note reviewed.  ?HENT:  ?   Head: Normocephalic and atraumatic.  ?   Right Ear: External ear normal.  ?   Left Ear: External ear normal.  ?   Nose: Nose normal.  ?   Mouth/Throat:  ?   Mouth: Mucous membranes are moist.  ?Eyes:  ?   Extraocular Movements: Extraocular movements intact.  ?Cardiovascular:  ?   Rate and Rhythm: Normal rate.  ?   Pulses: Normal pulses.  ?Pulmonary:  ?   Effort: Pulmonary effort is normal.  ?Abdominal:  ?   General: Abdomen is flat. There is no distension.  ?Musculoskeletal:     ?   General: Tenderness present.  ?   Cervical back: Normal range of motion.  ?   Comments: Pt is slow to rise from seated position to standing. Good lumbar range of motion. Strong distal strength without clonus, no pain upon palpation of greater trochanters. Sensation intact bilaterally. Ambulates with cane, gait slow and unsteady.  ?Skin: ?   General: Skin is warm and dry.  ?   Capillary Refill: Capillary refill takes less than 2 seconds.  ?Neurological:  ?   Mental Status: He is alert and oriented to person, place, and time.  ?   Gait: Gait abnormal.  ?Psychiatric:     ?  Mood and Affect: Mood normal.     ?   Behavior: Behavior normal.  ?  ?Ortho Exam ? ?Imaging: ?No results found. ? ?Past Medical/Family/Surgical/Social History: ?Medications & Allergies reviewed per EMR, new medications updated. ?Patient Active Problem List  ? Diagnosis Date Noted  ? Abnormal gait 11/05/2021  ? Arthropathy of spinal facet joint 11/05/2021  ? Chronic kidney disease, stage 3a ([place]) 11/05/2021  ? 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 Gastro-esophageal reflux disease without esophagitis 11/05/2021  ? Hardening of the aorta (main artery of the heart) ([place]) 11/05/2021  ? History of malignant neoplasm of prostate 11/05/2021  ? Hypercholesterolemia 11/05/2021  ? Lumbar spondylosis 11/05/2021  ?  Lymphoma ([place]) 11/05/2021  ? Sciatica 11/05/2021  ? Thrombocytopenia ([place]) 11/05/2021  ? Type 2 diabetes mellitus with other specified complication ([place]) 88/05/314  ? Chronic bilateral low back pain with bilateral sciatic

## 2021-11-07 NOTE — Progress Notes (Signed)
Pt state lower back pain that travels down the posterior of both thighs. Pt state walking and standing makes the pain worse. Pt state  he takes pain meds to help ease his pain. ? ?Numeric Pain Rating Scale and Functional Assessment ?Average Pain 10 ?Pain Right Now 8 ?My pain is intermittent, sharp, and aching ?Pain is worse with: walking, standing, and some activites ?Pain improves with: medication and injections ? ? ?In the last MONTH (on 0-10 scale) has pain interfered with the following? ? ?1. General activity like being  able to carry out your everyday physical activities such as walking, climbing stairs, carrying groceries, or moving a chair?  ?Rating(5) ? ?2. Relation with others like being able to carry out your usual social activities and roles such as  activities at home, at work and in your community. ?Rating(6) ? ?3. Enjoyment of life such that you have  been bothered by emotional problems such as feeling anxious, depressed or irritable?  ?Rating(7) ? ?

## 2021-11-09 ENCOUNTER — Encounter: Payer: Self-pay | Admitting: Physical Medicine and Rehabilitation

## 2021-11-10 ENCOUNTER — Ambulatory Visit (INDEPENDENT_AMBULATORY_CARE_PROVIDER_SITE_OTHER): Payer: Medicare Other | Admitting: Physical Medicine and Rehabilitation

## 2021-11-10 ENCOUNTER — Encounter: Payer: Self-pay | Admitting: Physical Medicine and Rehabilitation

## 2021-11-10 ENCOUNTER — Ambulatory Visit: Payer: Medicare Other

## 2021-11-10 VITALS — BP 128/74 | HR 88

## 2021-11-10 DIAGNOSIS — M5416 Radiculopathy, lumbar region: Secondary | ICD-10-CM | POA: Diagnosis not present

## 2021-11-10 MED ORDER — METHYLPREDNISOLONE ACETATE 80 MG/ML IJ SUSP
80.0000 mg | Freq: Once | INTRAMUSCULAR | Status: AC
  Administered 2021-11-10: 80 mg

## 2021-11-10 NOTE — Patient Instructions (Signed)

## 2021-11-10 NOTE — Progress Notes (Signed)
Pt state lower back pain that travels down the posterior of both thighs. Pt state walking and standing makes the pain worse. Pt state  he takes pain meds to help ease his pain. ? ?Numeric Pain Rating Scale and Functional Assessment ?Average Pain 6 ? ? ?In the last MONTH (on 0-10 scale) has pain interfered with the following? ? ?1. General activity like being  able to carry out your everyday physical activities such as walking, climbing stairs, carrying groceries, or moving a chair?  ?Rating(9) ? ? ?+Driver, -BT, -Dye Allergies. ? ?

## 2021-11-25 NOTE — Procedures (Signed)
Lumbar Epidural Steroid Injection - Interlaminar Approach with Fluoroscopic Guidance ? ?Patient: [name]      ?Date of Birth: 09/24/1940 ?MRN: [id number] ?PCP: Lajean Manes, MD      ?Visit Date: 11/10/2021 ?  ?Universal Protocol:    ? ?Consent Given By: the patient ? ?Position: PRONE ? ?Additional Comments: ?Vital signs were monitored before and after the procedure. ?Patient was prepped and draped in the usual sterile fashion. ?The correct patient, procedure, and site was verified. ? ? ?Injection Procedure Details:  ? ?Procedure diagnoses: Lumbar radiculopathy [M54.16]  ? ?Meds Administered:  ?Meds ordered this encounter  ?Medications  ? methylPREDNISolone acetate (DEPO-MEDROL) injection 80 mg  ?  ? ?Laterality: Right ? ?Location/Site:  L4-5 ? ?Needle: 3.5 in., 20 ga. Tuohy ? ?Needle Placement: Paramedian epidural ? ?Findings:  ? -Comments: Excellent flow of contrast into the epidural space. ? ?Procedure Details: ?Using a paramedian approach from the side mentioned above, the region overlying the inferior lamina was localized under fluoroscopic visualization and the soft tissues overlying this structure were infiltrated with 4 ml. of 1% Lidocaine without Epinephrine. The Tuohy needle was inserted into the epidural space using a paramedian approach.  ? ?The epidural space was localized using loss of resistance along with counter oblique bi-planar fluoroscopic views.  After negative aspirate for air, blood, and CSF, a 2 ml. volume of Isovue-250 was injected into the epidural space and the flow of contrast was observed. Radiographs were obtained for documentation purposes.   ? ?The injectate was administered into the level noted above. ? ? ?Additional Comments:  ?The patient tolerated the procedure well ?Dressing: 2 x 2 sterile gauze and Band-Aid ?  ? ?Post-procedure details: ?Patient was observed during the procedure. ?Post-procedure instructions were reviewed. ? ?Patient left the clinic in stable  condition. ?

## 2021-11-25 NOTE — Progress Notes (Signed)
? ?[name] - 81 y.o. male MRN [id number]  Date of birth: [birth year] ? ?Office Visit Note: ?Visit Date: 11/10/2021 ?PCP: Lajean Manes, MD ?Referred by: Lajean Manes, MD ? ?Subjective: ?Chief Complaint  ?Patient presents with  ? Lower Back - Pain  ? Right Thigh - Pain  ? Left Thigh - Pain  ? ?HPI:  [name] is a 81 y.o. male who comes in today at the request of Barnet Pall, FNP for planned Right L4-5 Lumbar Interlaminar epidural steroid injection with fluoroscopic guidance.  The patient has failed conservative care including home exercise, medications, time and activity modification.  This injection will be diagnostic and hopefully therapeutic.  Please see requesting physician notes for further details and justification. ? ?ROS Otherwise per HPI. ? ?Assessment & Plan: ?Visit Diagnoses:  ?  ICD-10-CM   ?1. Lumbar radiculopathy  M54.16 XR C-ARM NO REPORT  ?  Epidural Steroid injection  ?  methylPREDNISolone acetate (DEPO-MEDROL) injection 80 mg  ?  ?  ?Plan: No additional findings.  ? ?Meds & Orders:  ?Meds ordered this encounter  ?Medications  ? methylPREDNISolone acetate (DEPO-MEDROL) injection 80 mg  ?  ?Orders Placed This Encounter  ?Procedures  ? XR C-ARM NO REPORT  ? Epidural Steroid injection  ?  ?Follow-up: Return if symptoms worsen or fail to improve.  ? ?Procedures: ?No procedures performed  ?Lumbar Epidural Steroid Injection - Interlaminar Approach with Fluoroscopic Guidance ? ?Patient: [name]      ?Date of Birth: 06-Jun-1941 ?MRN: [id number] ?PCP: Lajean Manes, MD      ?Visit Date: 11/10/2021 ?  ?Universal Protocol:    ? ?Consent Given By: the patient ? ?Position: PRONE ? ?Additional Comments: ?Vital signs were monitored before and after the procedure. ?Patient was prepped and draped in the usual sterile fashion. ?The correct patient, procedure, and site was verified. ? ? ?Injection Procedure Details:  ? ?Procedure diagnoses: Lumbar radiculopathy [M54.16]  ? ?Meds  Administered:  ?Meds ordered this encounter  ?Medications  ? methylPREDNISolone acetate (DEPO-MEDROL) injection 80 mg  ?  ? ?Laterality: Right ? ?Location/Site:  L4-5 ? ?Needle: 3.5 in., 20 ga. Tuohy ? ?Needle Placement: Paramedian epidural ? ?Findings:  ? -Comments: Excellent flow of contrast into the epidural space. ? ?Procedure Details: ?Using a paramedian approach from the side mentioned above, the region overlying the inferior lamina was localized under fluoroscopic visualization and the soft tissues overlying this structure were infiltrated with 4 ml. of 1% Lidocaine without Epinephrine. The Tuohy needle was inserted into the epidural space using a paramedian approach.  ? ?The epidural space was localized using loss of resistance along with counter oblique bi-planar fluoroscopic views.  After negative aspirate for air, blood, and CSF, a 2 ml. volume of Isovue-250 was injected into the epidural space and the flow of contrast was observed. Radiographs were obtained for documentation purposes.   ? ?The injectate was administered into the level noted above. ? ? ?Additional Comments:  ?The patient tolerated the procedure well ?Dressing: 2 x 2 sterile gauze and Band-Aid ?  ? ?Post-procedure details: ?Patient was observed during the procedure. ?Post-procedure instructions were reviewed. ? ?Patient left the clinic in stable condition.  ? ?Clinical History: ?EXAM: ?MRI LUMBAR SPINE WITHOUT CONTRAST ?  ?TECHNIQUE: ?Multiplanar, multisequence MR imaging of the lumbar spine was ?performed. No intravenous contrast was administered. ?  ?COMPARISON:  X-ray lumbar 05/12/2021; MR lumbar 01/13/2014. ?  ?FINDINGS: ?Segmentation:  Standard. ?  ?Alignment:  Grade 1 anterolisthesis L3 on L4-L4  on L5. ?  ?Vertebrae: No acute fracture, evidence of discitis, or aggressive ?bone lesion. ?  ?Conus medullaris and cauda equina: Conus extends to the T12-L1 ?level. Conus and cauda equina appear normal. ?  ?Paraspinal and other soft tissues:  No acute paraspinal abnormality. ?  ?Disc levels: ?  ?Disc spaces: Disc desiccation throughout lumbar spine with relative ?sparing at L5-S1. ?  ?T12-L1: Mild broad-based disc bulge with a small central disc ?protrusion. No foraminal or central canal stenosis. ?  ?L1-L2: Minimal broad-based disc bulge. No foraminal or central canal ?stenosis. ?  ?L2-L3: Mild broad-based disc bulge with a broad right paracentral ?disc protrusion. Moderate right and moderate-severe left foraminal ?stenosis. Mild spinal stenosis. Prior left laminectomy. ?  ?L3-L4: Broad-based disc bulge flattening the ventral thecal sac. ?Moderate bilateral facet arthropathy. Severe spinal stenosis. ?Moderate bilateral foraminal stenosis. ?  ?L4-L5: Broad-based disc bulge. Severe bilateral facet arthropathy. ?Mild spinal stenosis scratch them moderate spinal stenosis. ?Moderate-severe bilateral foraminal stenosis. ?  ?L5-S1: No significant disc bulge. No neural foraminal stenosis. No ?central canal stenosis. ?  ?IMPRESSION: ?1. Diffuse lumbar spine spondylosis as described above which has ?progressed compared with 01/13/2014. ?2.  No acute osseous injury of the lumbar spine. ?  ?  ?Electronically Signed ?  By: Kathreen Devoid M.D. ?  On: 06/04/2021 15:50  ? ? ? ?Objective:  VS:  HT:    WT:   BMI:     BP:128/74  HR:88bpm  TEMP: ( )  RESP:  ?Physical Exam ?Vitals and nursing note reviewed.  ?Constitutional:   ?   General: He is not in acute distress. ?   Appearance: Normal appearance. He is not ill-appearing.  ?HENT:  ?   Head: Normocephalic and atraumatic.  ?   Right Ear: External ear normal.  ?   Left Ear: External ear normal.  ?   Nose: No congestion.  ?Eyes:  ?   Extraocular Movements: Extraocular movements intact.  ?Cardiovascular:  ?   Rate and Rhythm: Normal rate.  ?   Pulses: Normal pulses.  ?Pulmonary:  ?   Effort: Pulmonary effort is normal. No respiratory distress.  ?Abdominal:  ?   General: There is no distension.  ?   Palpations: Abdomen  is soft.  ?Musculoskeletal:     ?   General: No tenderness or signs of injury.  ?   Cervical back: Neck supple.  ?   Right lower leg: No edema.  ?   Left lower leg: No edema.  ?   Comments: Patient has good distal strength without clonus.  ?Skin: ?   Findings: No erythema or rash.  ?Neurological:  ?   General: No focal deficit present.  ?   Mental Status: He is alert and oriented to person, place, and time.  ?   Sensory: No sensory deficit.  ?   Motor: No weakness or abnormal muscle tone.  ?   Coordination: Coordination normal.  ?Psychiatric:     ?   Mood and Affect: Mood normal.     ?   Behavior: Behavior normal.  ?  ? ?Imaging: ?No results found. ?

## 2021-11-26 ENCOUNTER — Telehealth: Payer: Self-pay | Admitting: Physical Medicine and Rehabilitation

## 2021-11-26 NOTE — Telephone Encounter (Signed)
Patient called advised his back is doing much better. Patient said his left hip is bothering him and hurting pretty bad. Patient asked if he can schedule an appointment with Dr. Ernestina Patches? The number to contact patient is [phone] ?

## 2021-11-26 NOTE — Telephone Encounter (Signed)
IC scheduled appt 

## 2021-11-27 ENCOUNTER — Ambulatory Visit (INDEPENDENT_AMBULATORY_CARE_PROVIDER_SITE_OTHER): Payer: Medicare Other | Admitting: Surgery

## 2021-11-27 ENCOUNTER — Encounter: Payer: Self-pay | Admitting: Surgery

## 2021-11-27 VITALS — BP 143/79 | HR 71 | Ht 71.0 in | Wt 225.0 lb

## 2021-11-27 DIAGNOSIS — M4726 Other spondylosis with radiculopathy, lumbar region: Secondary | ICD-10-CM | POA: Diagnosis not present

## 2021-11-27 DIAGNOSIS — M5416 Radiculopathy, lumbar region: Secondary | ICD-10-CM

## 2021-11-27 NOTE — Telephone Encounter (Signed)
Return in about 1 week (around 12/04/2021) for WITH DR NEWTON TO DISCUSS REPEAT LUMBAR ESI'S. Please schedule ?

## 2021-12-03 ENCOUNTER — Ambulatory Visit (INDEPENDENT_AMBULATORY_CARE_PROVIDER_SITE_OTHER): Payer: Medicare Other | Admitting: Physical Medicine and Rehabilitation

## 2021-12-03 ENCOUNTER — Encounter: Payer: Self-pay | Admitting: Physical Medicine and Rehabilitation

## 2021-12-03 VITALS — BP 134/71 | HR 57

## 2021-12-03 DIAGNOSIS — M961 Postlaminectomy syndrome, not elsewhere classified: Secondary | ICD-10-CM | POA: Diagnosis not present

## 2021-12-03 DIAGNOSIS — M4726 Other spondylosis with radiculopathy, lumbar region: Secondary | ICD-10-CM | POA: Diagnosis not present

## 2021-12-03 DIAGNOSIS — M48062 Spinal stenosis, lumbar region with neurogenic claudication: Secondary | ICD-10-CM | POA: Diagnosis not present

## 2021-12-03 DIAGNOSIS — M5416 Radiculopathy, lumbar region: Secondary | ICD-10-CM

## 2021-12-03 MED ORDER — PREGABALIN 50 MG PO CAPS: ORAL_CAPSULE | ORAL | 1 refills | Status: DC

## 2021-12-03 NOTE — Progress Notes (Signed)
Pt state lower back pain that travels to his buttock that travels to both hip and down his left leg. Pt state standing and lifting makes the pain worse. Pt state he takes pain meds to help ease his pain.  Numeric Pain Rating Scale and Functional Assessment Average Pain 10 Pain Right Now 6 My pain is intermittent, sharp, burning, and tingling Pain is worse with: walking, standing, and some activites Pain improves with: medication   In the last MONTH (on 0-10 scale) has pain interfered with the following?  1. General activity like being  able to carry out your everyday physical activities such as walking, climbing stairs, carrying groceries, or moving a chair?  Rating(5)  2. Relation with others like being able to carry out your usual social activities and roles such as  activities at home, at work and in your community. Rating(6)  3. Enjoyment of life such that you have  been bothered by emotional problems such as feeling anxious, depressed or irritable?  Rating(7)

## 2021-12-03 NOTE — Progress Notes (Signed)
BYRL LATIN - 81 y.o. male MRN [id number]  Date of birth: 02/19/1941  Office Visit Note: Visit Date: 12/03/2021 PCP: Lajean Manes, MD Referred by: Lajean Manes, MD  Subjective: Chief Complaint  Patient presents with   Lower Back - Pain   Left Hip - Pain   Right Hip - Pain   Left Leg - Pain   HPI: [name] is a 81 y.o. male who comes in today for evaluation of chronic bilateral lower back pain radiating down to both legs. Patient reports pain has been ongoing for several months and is exacerbated by prolonged standing and walking.  Patient describes pain as a constant sore, aching and tingling sensation.  Patient states he does experience intermittent numbness/tingling sensation to bilateral legs.  Patient currently rates pain as 2 out of 10. Patient reports some relief of pain with home exercise regimen, rest and use of medications. Patient has recently completed regimen of physical therapy at [facility], he reports no relief of pain with these treatments.  Patients lumbar MRI from 2022 exhibits prior left laminectomy at L2-L3, severe spinal canal stenosis at L3-L4 and moderate at L4-L5. Patient recently had right L4-L5 interlaminar epidural steroid injection performed in our office on 11/10/2021, he reports greater than 75% relief of pain following this procedure. Patient states his pain is much more manageable at home now, he does have good and bad days, however pain is not severe at present. Patient is no longer using cane to assist with ambulation and prevent falls. Patient denies focal weakness, numbness and tingling. Patient denies recent trauma or falls.   Review of Systems  Musculoskeletal:  Positive for back pain.  Neurological:  Positive for tingling. Negative for focal weakness and weakness.  All other systems reviewed and are negative. Otherwise per HPI.  Assessment & Plan: Visit Diagnoses:    ICD-10-CM   1. Lumbar radiculopathy  M54.16     2.  Other spondylosis with radiculopathy, lumbar region  M47.26     3. Spinal stenosis of lumbar region with neurogenic claudication  M48.062     4. Post laminectomy syndrome  M96.1        Plan: Findings:  Chronic bilateral lower back pain radiating down to both legs. Significant and sustained pain relief with recent right L4-L5 interlaminar epidural steroid injection. Patient continues to manage pain with conservative therapies such as home exercise regimen, rest and use of medications. Patients clinical presentation and exam are consistent with neurogenic claudication as a result of spinal canal stenosis. He does have severe spinal canal stenosis at L3-L4 and moderate at L4-L5. We do not feel repeating lumbar epidural steroid injection at this time would be beneficial, we will continue to monitor patient. Patient did inquire about medication management today, I placed prescription for Lyrica, will start with 50 mg once at bedtime then if tolerating can move to twice a day. Patient has no questions at this time, I would like to see him back in approximately 4 weeks for re-evaluation. No red flag symptoms noted upon exam today.    Meds & Orders:  Meds ordered this encounter  Medications   pregabalin (LYRICA) 50 MG capsule    Sig: 1 tablet at night for one week, then 1 tablet in the morning and 1 tablet at night.    Dispense:  60 capsule    Refill:  1    Order Specific Question:   Supervising Provider    Answer:   Magnus Sinning [phone]  No orders of the defined types were placed in this encounter.   Follow-up: Return for 4 week follow up.   Procedures: No procedures performed      Clinical History: EXAM: MRI LUMBAR SPINE WITHOUT CONTRAST   TECHNIQUE: Multiplanar, multisequence MR imaging of the lumbar spine was performed. No intravenous contrast was administered.   COMPARISON:  X-ray lumbar 05/12/2021; MR lumbar 01/13/2014.   FINDINGS: Segmentation:  Standard.   Alignment:   Grade 1 anterolisthesis L3 on L4-L4 on L5.   Vertebrae: No acute fracture, evidence of discitis, or aggressive bone lesion.   Conus medullaris and cauda equina: Conus extends to the T12-L1 level. Conus and cauda equina appear normal.   Paraspinal and other soft tissues: No acute paraspinal abnormality.   Disc levels:   Disc spaces: Disc desiccation throughout lumbar spine with relative sparing at L5-S1.   T12-L1: Mild broad-based disc bulge with a small central disc protrusion. No foraminal or central canal stenosis.   L1-L2: Minimal broad-based disc bulge. No foraminal or central canal stenosis.   L2-L3: Mild broad-based disc bulge with a broad right paracentral disc protrusion. Moderate right and moderate-severe left foraminal stenosis. Mild spinal stenosis. Prior left laminectomy.   L3-L4: Broad-based disc bulge flattening the ventral thecal sac. Moderate bilateral facet arthropathy. Severe spinal stenosis. Moderate bilateral foraminal stenosis.   L4-L5: Broad-based disc bulge. Severe bilateral facet arthropathy. Mild spinal stenosis scratch them moderate spinal stenosis. Moderate-severe bilateral foraminal stenosis.   L5-S1: No significant disc bulge. No neural foraminal stenosis. No central canal stenosis.   IMPRESSION: 1. Diffuse lumbar spine spondylosis as described above which has progressed compared with 01/13/2014. 2.  No acute osseous injury of the lumbar spine.     Electronically Signed   By: Kathreen Devoid M.D.   On: 06/04/2021 15:50   He reports that he has quit smoking. His smoking use included cigarettes. He has a 20.00 pack-year smoking history. He has never used smokeless tobacco. No results for input(s): HGBA1C, LABURIC in the last 8760 hours.  Objective:  VS:  HT:    WT:   BMI:     BP:134/71  HR:(!) 57bpm  TEMP: ( )  RESP:  Physical Exam Vitals and nursing note reviewed.  HENT:     Head: Normocephalic and atraumatic.     Right Ear: External  ear normal.     Left Ear: External ear normal.     Nose: Nose normal.     Mouth/Throat:     Mouth: Mucous membranes are moist.  Eyes:     Extraocular Movements: Extraocular movements intact.  Cardiovascular:     Rate and Rhythm: Normal rate.     Pulses: Normal pulses.  Pulmonary:     Effort: Pulmonary effort is normal.  Abdominal:     General: Abdomen is flat. There is no distension.  Musculoskeletal:        General: Tenderness present.     Cervical back: Normal range of motion.     Comments: Pt rises from seated position to standing without difficulty. Good lumbar range of motion. Strong distal strength without clonus, no pain upon palpation of greater trochanters. Sensation intact bilaterally. Walks independently, gait steady.   Skin:    General: Skin is warm and dry.     Capillary Refill: Capillary refill takes less than 2 seconds.  Neurological:     General: No focal deficit present.     Mental Status: He is alert and oriented to person, place, and time.  Psychiatric:        Mood and Affect: Mood normal.        Behavior: Behavior normal.    Ortho Exam  Imaging: No results found.  Past Medical/Family/Surgical/Social History: Medications & Allergies reviewed per EMR, new medications updated. 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   Gastro-esophageal reflux disease without esophagitis 11/05/2021   Hardening of the aorta (main artery of the heart) ([place]) 11/05/2021   History of malignant neoplasm of prostate 11/05/2021   Hypercholesterolemia 11/05/2021   Lumbar spondylosis 11/05/2021   Lymphoma ([place]) 11/05/2021   Sciatica 11/05/2021   Thrombocytopenia ([place]) 11/05/2021   Type 2 diabetes mellitus with other specified complication ([place]) 70/96/2836   Chronic bilateral low back pain with  bilateral sciatica 11/05/2021   Gait abnormality 11/05/2021   Trochanteric bursitis of left hip 11/06/2020   Pain in right knee 10/11/2018   Deviated nasal septum 04/01/2016   Sinusitis, chronic 04/01/2016   Chronic frontal sinusitis 02/04/2016   Nasal turbinate hypertrophy 02/04/2016   Obstructive sleep apnea 02/04/2016   Prostate cancer ([place])    Spinal stenosis in cervical region 04/15/2015   Lateral meniscal tear 03/27/2015   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   Past Medical History:  Diagnosis Date   Acute meniscal tear of right knee    Arthritis    Cancer ([place]) 2015   lymphoma new dx area removed behind right ear   Cancer ([place]) 2010   prostate   Deviated septum    chronic sinusitis, nasal turbinate hypertrophy   Diabetes mellitus without complication ([place])    on metformin   GERD (gastroesophageal reflux disease)    Hypertension    Pneumonia    Sleep apnea    wears CPAP   Wears glasses    Family History  Problem Relation Age of Onset   Heart attack Father    Heart attack Sister    Past Surgical History:  Procedure Laterality Date   back injection     to lower back   BACK SURGERY     lower back   KNEE ARTHROSCOPY Right 03/27/2015   Procedure: RIGHT ARTHROSCOPY KNEE WITH DEBRIDEMENT, PARTIAL LATERAL MENISCECTOMY;  Surgeon: Gaynelle Arabian, MD;  Location: [facility];  Service: Orthopedics;  Laterality: Right;   NASAL SEPTOPLASTY W/ TURBINOPLASTY Bilateral 04/01/2016   Procedure: NASAL SEPTOPLASTY WITH BILATERAL INFERIOR  TURBINATE REDUCTION;  Surgeon: Jerrell Belfast, MD;  Location: [place];  Service: ENT;  Laterality: Bilateral;   PROSTATECTOMY  2010   SINUS ENDO WITH FUSION Right 04/01/2016   Procedure: RIGHT ENDOSCOPIC SINUS SURGERY,AND CONSISTING  OF ANTERIOR ETHMOIDECTOMY;  Surgeon: Jerrell Belfast, MD;  Location: [facility] OR;  Service: ENT;  Laterality: Right;   surgery for collapsed lung Left 35 to 40 years ago   Social History   Occupational  History   Not on file  Tobacco Use   Smoking status: Former    Packs/day: 1.00    Years: 20.00    Pack years: 20.00    Types: Cigarettes   Smokeless tobacco: Never   Tobacco comments:    quit smoking 24 years ago 03/30/16  Substance and Sexual Activity   Alcohol use: No   Drug use: No   Sexual activity: Not on file

## 2021-12-05 NOTE — Progress Notes (Signed)
Office Visit Note   Patient: [name]           Date of Birth: 11-03-1940           MRN: [id number] Visit Date: 11/27/2021              Requested by: Lajean Manes, MD [address] [place] [address] [place],  [place] [address] PCP: Lajean Manes, MD   Assessment & Plan: Visit Diagnoses: No diagnosis found.  Plan: Since patient states that he has had some improvement I will have him follow-up with Dr. Ernestina Patches to discuss the possibility of scheduling another injection.  Dr. Ernestina Patches can make decision as to when patient needs to follow-up with Dr. Lorin Mercy or Dr. Louanne Skye to discuss any potential surgical intervention if needed.  Follow-Up Instructions: Return in about 1 week (around 12/04/2021) for WITH DR NEWTON TO DISCUSS REPEAT LUMBAR ESI'S.   Orders:  No orders of the defined types were placed in this encounter.  No orders of the defined types were placed in this encounter.     Procedures: No procedures performed   Clinical Data: No additional findings.   Subjective: Chief Complaint  Patient presents with   Left Hip - Pain    HPI 81 year old male returns for recheck of his low back pain.  Patient previously seen by Dr. Ernestina Patches for lumbar spine issues but there was a mixup and he was added to my schedule.  Dr. Ernestina Patches performed lumbar [facility] November 10, 2021.  Overall patient states that he has had good improvement.  He is complaining of pain around the left side of his pelvis.  No groin pain.  No pain radiating down his leg. Review of Systems No current cardiopulmonary GI/GU issues  Objective: Vital Signs: BP (!) 143/79   Pulse 71   Ht '5\' 11"'$  (1.803 m)   Wt 225 lb (102.1 kg)   BMI 31.38 kg/m   Physical Exam Patient is ambulating well.  No lumbar paraspinal tenderness.  No sciatic notch tenderness.  He has some tenderness just above the left side of his pelvis and to his low back.  Negative logroll.  Negative straight leg raise.  No motor deficits. Ortho  Exam  Specialty Comments:  EXAM: MRI LUMBAR SPINE WITHOUT CONTRAST   TECHNIQUE: Multiplanar, multisequence MR imaging of the lumbar spine was performed. No intravenous contrast was administered.   COMPARISON:  X-ray lumbar 05/12/2021; MR lumbar 01/13/2014.   FINDINGS: Segmentation:  Standard.   Alignment:  Grade 1 anterolisthesis L3 on L4-L4 on L5.   Vertebrae: No acute fracture, evidence of discitis, or aggressive bone lesion.   Conus medullaris and cauda equina: Conus extends to the T12-L1 level. Conus and cauda equina appear normal.   Paraspinal and other soft tissues: No acute paraspinal abnormality.   Disc levels:   Disc spaces: Disc desiccation throughout lumbar spine with relative sparing at L5-S1.   T12-L1: Mild broad-based disc bulge with a small central disc protrusion. No foraminal or central canal stenosis.   L1-L2: Minimal broad-based disc bulge. No foraminal or central canal stenosis.   L2-L3: Mild broad-based disc bulge with a broad right paracentral disc protrusion. Moderate right and moderate-severe left foraminal stenosis. Mild spinal stenosis. Prior left laminectomy.   L3-L4: Broad-based disc bulge flattening the ventral thecal sac. Moderate bilateral facet arthropathy. Severe spinal stenosis. Moderate bilateral foraminal stenosis.   L4-L5: Broad-based disc bulge. Severe bilateral facet arthropathy. Mild spinal stenosis scratch them moderate spinal stenosis. Moderate-severe bilateral foraminal stenosis.  L5-S1: No significant disc bulge. No neural foraminal stenosis. No central canal stenosis.   IMPRESSION: 1. Diffuse lumbar spine spondylosis as described above which has progressed compared with 01/13/2014. 2.  No acute osseous injury of the lumbar spine.     Electronically Signed   By: Kathreen Devoid M.D.   On: 06/04/2021 15:50  Imaging: No results found.   PMFS History: Patient Active Problem List   Diagnosis Date Noted    Abnormal gait 11/05/2021   Arthropathy of spinal facet joint 11/05/2021   Chronic kidney disease, stage 3a ([place]) 11/05/2021   Diabetic renal disease ([place]) 11/05/2021   Gastro-esophageal reflux disease without esophagitis 11/05/2021   Hardening of the aorta (main artery of the heart) ([place]) 11/05/2021   History of malignant neoplasm of prostate 11/05/2021   Hypercholesterolemia 11/05/2021   Lumbar spondylosis 11/05/2021   Lymphoma ([place]) 11/05/2021   Sciatica 11/05/2021   Thrombocytopenia ([place]) 11/05/2021   Type 2 diabetes mellitus with other specified complication ([place]) 27/74/1287   Chronic bilateral low back pain with bilateral sciatica 11/05/2021   Gait abnormality 11/05/2021   Trochanteric bursitis of left hip 11/06/2020   Pain in right knee 10/11/2018   Deviated nasal septum 04/01/2016   Sinusitis, chronic 04/01/2016   Chronic frontal sinusitis 02/04/2016   Nasal turbinate hypertrophy 02/04/2016   Obstructive sleep apnea 02/04/2016   Prostate cancer ([place])    Spinal stenosis in cervical region 04/15/2015   Lateral meniscal tear 03/27/2015   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   Past Medical History:  Diagnosis Date   Acute meniscal tear of right knee    Arthritis    Cancer ([place]) 2015   lymphoma new dx area removed behind right ear   Cancer ([place]) 2010   prostate   Deviated septum    chronic sinusitis, nasal turbinate hypertrophy   Diabetes mellitus without complication ([place])    on metformin   GERD (gastroesophageal reflux disease)    Hypertension    Pneumonia    Sleep apnea    wears CPAP   Wears glasses     Family History  Problem Relation Age of Onset   Heart attack Father    Heart attack Sister     Past Surgical History:  Procedure Laterality Date   back injection     to lower back   BACK SURGERY     lower back   KNEE ARTHROSCOPY Right 03/27/2015   Procedure: RIGHT ARTHROSCOPY KNEE WITH DEBRIDEMENT, PARTIAL LATERAL MENISCECTOMY;   Surgeon: Gaynelle Arabian, MD;  Location: [facility];  Service: Orthopedics;  Laterality: Right;   NASAL SEPTOPLASTY W/ TURBINOPLASTY Bilateral 04/01/2016   Procedure: NASAL SEPTOPLASTY WITH BILATERAL INFERIOR  TURBINATE REDUCTION;  Surgeon: Jerrell Belfast, MD;  Location: [place];  Service: ENT;  Laterality: Bilateral;   PROSTATECTOMY  2010   SINUS ENDO WITH FUSION Right 04/01/2016   Procedure: RIGHT ENDOSCOPIC SINUS SURGERY,AND CONSISTING  OF ANTERIOR ETHMOIDECTOMY;  Surgeon: Jerrell Belfast, MD;  Location: [facility];  Service: ENT;  Laterality: Right;   surgery for collapsed lung Left 35 to 40 years ago   Social History   Occupational History   Not on file  Tobacco Use   Smoking status: Former    Packs/day: 1.00    Years: 20.00    Pack years: 20.00    Types: Cigarettes   Smokeless tobacco: Never   Tobacco comments:    quit smoking 24 years ago 03/30/16  Substance and Sexual Activity   Alcohol use: No  Drug use: No   Sexual activity: Not on file

## 2021-12-19 DIAGNOSIS — H40053 Ocular hypertension, bilateral: Secondary | ICD-10-CM | POA: Diagnosis not present

## 2021-12-25 ENCOUNTER — Ambulatory Visit: Payer: Medicare Other | Admitting: Surgery

## 2021-12-25 ENCOUNTER — Ambulatory Visit (INDEPENDENT_AMBULATORY_CARE_PROVIDER_SITE_OTHER): Payer: Medicare Other | Admitting: Surgery

## 2021-12-25 ENCOUNTER — Encounter: Payer: Self-pay | Admitting: Surgery

## 2021-12-25 VITALS — BP 153/76 | HR 51 | Ht 71.0 in | Wt 225.0 lb

## 2021-12-25 DIAGNOSIS — M48062 Spinal stenosis, lumbar region with neurogenic claudication: Secondary | ICD-10-CM

## 2021-12-25 NOTE — Progress Notes (Signed)
Office Visit Note   Patient: [name]           Date of Birth: 09-08-1940           MRN: [id number] Visit Date: 12/25/2021              Requested by: Lajean Manes, MD [address] [place],  [place] [address] PCP: Lajean Manes, MD   Assessment & Plan: Visit Diagnoses:  1. Spinal stenosis of lumbar region with neurogenic claudication     Plan: With patient's ongoing problem I advised him that I and limited at this point as far as what I can offer him.  I recommend that he follow-up with Dr. Lorin Mercy in 4 weeks to discuss possible surgical options.  All questions answered.  Follow-Up Instructions: Return in about 4 weeks (around 01/22/2022) for WITH DR Ewa [place]. .   Orders:  No orders of the defined types were placed in this encounter.  No orders of the defined types were placed in this encounter.     Procedures: No procedures performed   Clinical Data: No additional findings.   Subjective: No chief complaint on file.   HPI 82 year old black male returns with complaints of chronic low back pain, neurogenic claudication symptoms.  Last visit with patient I referred him to Dr. Ernestina Patches to discuss possibly repeating ESI's.  He states that he was advised by nurse practitioner that no more would be done.  He still continues to describe ongoing back pain and neurogenic claudication symptoms.  Pain along the left side of his pelvis.  Numbness and tingling and pain in his legs down to his feet.   Objective: Vital Signs: BP (!) 153/76   Pulse (!) 51   Ht '5\' 11"'$  (1.803 m)   Wt 225 lb (102.1 kg)   BMI 31.38 kg/m   Physical Exam Pleasant elderly male alert and oriented in no acute distress.  Ambulates with a cane.  Negative logroll bilateral hips.  Nontender over the left greater trochanter bursa. Ortho Exam  Specialty Comments:  EXAM: MRI LUMBAR SPINE WITHOUT CONTRAST   TECHNIQUE: Multiplanar, multisequence MR  imaging of the lumbar spine was performed. No intravenous contrast was administered.   COMPARISON:  X-ray lumbar 05/12/2021; MR lumbar 01/13/2014.   FINDINGS: Segmentation:  Standard.   Alignment:  Grade 1 anterolisthesis L3 on L4-L4 on L5.   Vertebrae: No acute fracture, evidence of discitis, or aggressive bone lesion.   Conus medullaris and cauda equina: Conus extends to the T12-L1 level. Conus and cauda equina appear normal.   Paraspinal and other soft tissues: No acute paraspinal abnormality.   Disc levels:   Disc spaces: Disc desiccation throughout lumbar spine with relative sparing at L5-S1.   T12-L1: Mild broad-based disc bulge with a small central disc protrusion. No foraminal or central canal stenosis.   L1-L2: Minimal broad-based disc bulge. No foraminal or central canal stenosis.   L2-L3: Mild broad-based disc bulge with a broad right paracentral disc protrusion. Moderate right and moderate-severe left foraminal stenosis. Mild spinal stenosis. Prior left laminectomy.   L3-L4: Broad-based disc bulge flattening the ventral thecal sac. Moderate bilateral facet arthropathy. Severe spinal stenosis. Moderate bilateral foraminal stenosis.   L4-L5: Broad-based disc bulge. Severe bilateral facet arthropathy. Mild spinal stenosis scratch them moderate spinal stenosis. Moderate-severe bilateral foraminal stenosis.   L5-S1: No significant disc bulge. No neural foraminal stenosis. No central canal stenosis.   IMPRESSION: 1. Diffuse lumbar spine spondylosis  as described above which has progressed compared with 01/13/2014. 2.  No acute osseous injury of the lumbar spine.     Electronically Signed   By: Kathreen Devoid M.D.   On: 06/04/2021 15:50  Imaging: No results found.   PMFS History: Patient Active Problem List   Diagnosis Date Noted   Abnormal gait 11/05/2021   Arthropathy of spinal facet joint 11/05/2021   Chronic kidney disease, stage 3a ([place])  11/05/2021   Diabetic renal disease ([place]) 11/05/2021   Gastro-esophageal reflux disease without esophagitis 11/05/2021   Hardening of the aorta (main artery of the heart) ([place]) 11/05/2021   History of malignant neoplasm of prostate 11/05/2021   Hypercholesterolemia 11/05/2021   Lumbar spondylosis 11/05/2021   Lymphoma ([place]) 11/05/2021   Sciatica 11/05/2021   Thrombocytopenia ([place]) 11/05/2021   Type 2 diabetes mellitus with other specified complication ([place]) 18/56/3149   Chronic bilateral low back pain with bilateral sciatica 11/05/2021   Gait abnormality 11/05/2021   Trochanteric bursitis of left hip 11/06/2020   Pain in right knee 10/11/2018   Deviated nasal septum 04/01/2016   Sinusitis, chronic 04/01/2016   Chronic frontal sinusitis 02/04/2016   Nasal turbinate hypertrophy 02/04/2016   Obstructive sleep apnea 02/04/2016   Prostate cancer ([place])    Spinal stenosis in cervical region 04/15/2015   Lateral meniscal tear 03/27/2015   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   Past Medical History:  Diagnosis Date   Acute meniscal tear of right knee    Arthritis    Cancer ([place]) 2015   lymphoma new dx area removed behind right ear   Cancer ([place]) 2010   prostate   Deviated septum    chronic sinusitis, nasal turbinate hypertrophy   Diabetes mellitus without complication ([place])    on metformin   GERD (gastroesophageal reflux disease)    Hypertension    Pneumonia    Sleep apnea    wears CPAP   Wears glasses     Family History  Problem Relation Age of Onset   Heart attack Father    Heart attack Sister     Past Surgical History:  Procedure Laterality Date   back injection     to lower back   BACK SURGERY     lower back   KNEE ARTHROSCOPY Right 03/27/2015   Procedure: RIGHT ARTHROSCOPY KNEE WITH DEBRIDEMENT, PARTIAL LATERAL MENISCECTOMY;  Surgeon: Gaynelle Arabian, MD;  [facility]: [facility];  Service: Orthopedics;  Laterality: Right;   NASAL SEPTOPLASTY W/  TURBINOPLASTY Bilateral 04/01/2016   Procedure: NASAL SEPTOPLASTY WITH BILATERAL INFERIOR  TURBINATE REDUCTION;  Surgeon: Jerrell Belfast, MD;  Location: [facility];  Service: ENT;  Laterality: Bilateral;   PROSTATECTOMY  2010   SINUS ENDO WITH FUSION Right 04/01/2016   Procedure: RIGHT ENDOSCOPIC SINUS SURGERY,AND CONSISTING  OF ANTERIOR ETHMOIDECTOMY;  Surgeon: Jerrell Belfast, MD;  [facility];  Service: ENT;  Laterality: Right;   surgery for collapsed lung Left 35 to 40 years ago   Social History   Occupational History   Not on file  Tobacco Use   Smoking status: Former    Packs/day: 1.00    Years: 20.00    Total pack years: 20.00    Types: Cigarettes   Smokeless tobacco: Never   Tobacco comments:    quit smoking 24 years ago 03/30/16  Substance and Sexual Activity   Alcohol use: No   Drug use: No   Sexual activity: Not on file

## 2021-12-31 ENCOUNTER — Encounter: Payer: Self-pay | Admitting: Physical Medicine and Rehabilitation

## 2021-12-31 ENCOUNTER — Ambulatory Visit (INDEPENDENT_AMBULATORY_CARE_PROVIDER_SITE_OTHER): Payer: Medicare Other | Admitting: Physical Medicine and Rehabilitation

## 2021-12-31 DIAGNOSIS — M48062 Spinal stenosis, lumbar region with neurogenic claudication: Secondary | ICD-10-CM

## 2021-12-31 DIAGNOSIS — M961 Postlaminectomy syndrome, not elsewhere classified: Secondary | ICD-10-CM | POA: Diagnosis not present

## 2021-12-31 DIAGNOSIS — M4726 Other spondylosis with radiculopathy, lumbar region: Secondary | ICD-10-CM

## 2021-12-31 DIAGNOSIS — M5416 Radiculopathy, lumbar region: Secondary | ICD-10-CM

## 2021-12-31 DIAGNOSIS — R269 Unspecified abnormalities of gait and mobility: Secondary | ICD-10-CM | POA: Diagnosis not present

## 2021-12-31 NOTE — Progress Notes (Signed)
Pt state lyrica didn't help with his pain.

## 2021-12-31 NOTE — Progress Notes (Signed)
[name] - 81 y.o. male MRN [id number]  Date of birth: 12-Jun-1941  Office Visit Note: Visit Date: 12/31/2021 PCP: Lajean Manes, MD Referred by: Lajean Manes, MD  Subjective: Chief Complaint  Patient presents with   Lower Back - Pain   Right Leg - Pain   Left Leg - Pain   Right Hip - Pain   Left Hip - Pain   HPI: [name] is a 81 y.o. male who comes in today for evaluation of chronic, worsening and severe bilateral lower back pain radiating to both legs. Pain has been ongoing for several months and is exacerbated by prolonged standing and walking. Patient describes pain as an aching and tingling sensation, currently rates pain as 7 out of 10. Patient reports some relief of pain with home exercise regimen, rest and use of medications. Patient has attended formal physical therapy in the past at [name], [name] reports no relief of pain with these treatments. Patients lumbar MRI from 2022 exhibits prior left laminectomy at L2-L3, severe spinal canal stenosis at L3-L4 and moderate at L4-L5. Patient had right L4-L5 interlaminar epidural steroid injection performed in our office on 11/10/2021, reports 75% relief of pain lasting approximately 2 weeks. Patient states previous injection helped significantly, to the point that [name] was not requiring use of cane when walking.  Patient was recently started on Lyrica and is now taking twice a day without any issues.  Patient states [name] is unable to tell at this time if Lyrica is helping to alleviate his pain.  Patient states [name] would like to discuss treatment options and recommendation for surgical consultation. Patient denies focal weakness. Patient denies recent trauma or falls.    Review of Systems  Musculoskeletal:  Positive for back pain.  Neurological:  Positive for tingling. Negative for focal weakness and weakness.  All other systems reviewed and are negative.  Otherwise per HPI.  Assessment & Plan: Visit Diagnoses:     ICD-10-CM   1. Lumbar radiculopathy  M54.16 Ambulatory referral to Physical Medicine Rehab    Ambulatory referral to Neurosurgery    CANCELED: Ambulatory referral to Neurosurgery    2. Spinal stenosis of lumbar region with neurogenic claudication  M48.062 Ambulatory referral to Physical Medicine Rehab    Ambulatory referral to Neurosurgery    CANCELED: Ambulatory referral to Neurosurgery    3. Other spondylosis with radiculopathy, lumbar region  M47.26 Ambulatory referral to Physical Medicine Rehab    Ambulatory referral to Neurosurgery    CANCELED: Ambulatory referral to Neurosurgery    4. Post laminectomy syndrome  M96.1 Ambulatory referral to Physical Medicine Rehab    Ambulatory referral to Neurosurgery    CANCELED: Ambulatory referral to Neurosurgery    5. Gait disturbance  R26.9 Ambulatory referral to Physical Medicine Rehab    Ambulatory referral to Neurosurgery    CANCELED: Ambulatory referral to Neurosurgery       Plan: Findings:  Chronic, worsening and severe bilateral lower back pain radiating to both legs.  Patient continues to have severe pain despite good conservative therapy such as formal physical therapy, home exercise regimen, rest and use of medications.  Patient's clinical presentation and exam are consistent with neurogenic claudication as a result of spinal canal stenosis.  [name] does have severe spinal canal stenosis noted at L3-L4. Good relief with previous lumbar epidural steroid injection performed in our office, however pain relief was short lived.  We believe the neck step is to perform a diagnostic and hopefully therapeutic  bilateral L3 transforaminal epidural steroid injection under fluoroscopic guidance. I also discussed surgical consultation with patient today, I feel it would be beneficial for him to speak with a surgeon to discuss options and ask questions.  Patient was previously treated by Dr. Marlaine Hind at [facility], per patient's request  I have placed a referral to Dr. Sherley Bounds within that practice.  We will see patient back for lumbar epidural steroid injection and will also look for consult notes from Dr. Ronnald Ramp following his visit.  Patient is agreeable with treatment plan and has no further questions at this time. Patient encouraged to remain active and to continue using cane to assist with ambulation.     Meds & Orders: No orders of the defined types were placed in this encounter.   Orders Placed This Encounter  Procedures   Ambulatory referral to Physical Medicine Rehab   Ambulatory referral to Neurosurgery    Follow-up: Return for Bilateral L3 transforaminal epidural steroid injection.   Procedures: No procedures performed      Clinical History: EXAM: MRI LUMBAR SPINE WITHOUT CONTRAST   TECHNIQUE: Multiplanar, multisequence MR imaging of the lumbar spine was performed. No intravenous contrast was administered.   COMPARISON:  X-ray lumbar 05/12/2021; MR lumbar 01/13/2014.   FINDINGS: Segmentation:  Standard.   Alignment:  Grade 1 anterolisthesis L3 on L4-L4 on L5.   Vertebrae: No acute fracture, evidence of discitis, or aggressive bone lesion.   Conus medullaris and cauda equina: Conus extends to the T12-L1 level. Conus and cauda equina appear normal.   Paraspinal and other soft tissues: No acute paraspinal abnormality.   Disc levels:   Disc spaces: Disc desiccation throughout lumbar spine with relative sparing at L5-S1.   T12-L1: Mild broad-based disc bulge with a small central disc protrusion. No foraminal or central canal stenosis.   L1-L2: Minimal broad-based disc bulge. No foraminal or central canal stenosis.   L2-L3: Mild broad-based disc bulge with a broad right paracentral disc protrusion. Moderate right and moderate-severe left foraminal stenosis. Mild spinal stenosis. Prior left laminectomy.   L3-L4: Broad-based disc bulge flattening the ventral thecal sac. Moderate bilateral  facet arthropathy. Severe spinal stenosis. Moderate bilateral foraminal stenosis.   L4-L5: Broad-based disc bulge. Severe bilateral facet arthropathy. Mild spinal stenosis scratch them moderate spinal stenosis. Moderate-severe bilateral foraminal stenosis.   L5-S1: No significant disc bulge. No neural foraminal stenosis. No central canal stenosis.   IMPRESSION: 1. Diffuse lumbar spine spondylosis as described above which has progressed compared with 01/13/2014. 2.  No acute osseous injury of the lumbar spine.     Electronically Signed   By: Kathreen Devoid M.D.   On: 06/04/2021 15:50   [name] reports that [name] has quit smoking. His smoking use included cigarettes. [name] has a 20.00 pack-year smoking history. [name] has never used smokeless tobacco. No results for input(s): "HGBA1C", "LABURIC" in the last 8760 hours.  Objective:  VS:  HT:    WT:   BMI:     BP:   HR: bpm  TEMP: ( )  RESP:  Physical Exam Vitals and nursing note reviewed.  HENT:     Head: Normocephalic and atraumatic.     Right Ear: External ear normal.     Left Ear: External ear normal.     Nose: Nose normal.     Mouth/Throat:     Mouth: Mucous membranes are moist.  Eyes:     Extraocular Movements: Extraocular movements intact.  Cardiovascular:  Rate and Rhythm: Normal rate.     Pulses: Normal pulses.  Pulmonary:     Effort: Pulmonary effort is normal.  Abdominal:     General: Abdomen is flat. There is no distension.  Musculoskeletal:        General: Tenderness present.     Cervical back: Normal range of motion.     Comments: [name] rises from seated position to standing without difficulty. Good lumbar range of motion. Strong distal strength without clonus, no pain upon palpation of greater trochanters. Sensation intact bilaterally. Ambulates with cane, gait slow and unsteady.   Skin:    General: Skin is warm and dry.     Capillary Refill: Capillary refill takes less than 2 seconds.  Neurological:     General: No  focal deficit present.     Mental Status: [name] is alert and oriented to person, place, and time.  Psychiatric:        Mood and Affect: Mood normal.        Behavior: Behavior normal.     Ortho Exam  Imaging: No results found.  Past Medical/Family/Surgical/Social History: Medications & Allergies reviewed per EMR, new medications updated. Patient Active Problem List   Diagnosis Date Noted   Abnormal gait 11/05/2021   Arthropathy of spinal facet joint 11/05/2021   Chronic kidney disease, stage 3a ([place]) 11/05/2021   Diabetic renal disease ([place]) 11/05/2021   Gastro-esophageal reflux disease without esophagitis 11/05/2021   Hardening of the aorta (main artery of the heart) ([place]) 11/05/2021   History of malignant neoplasm of prostate 11/05/2021   Hypercholesterolemia 11/05/2021   Lumbar spondylosis 11/05/2021   Lymphoma ([place]) 11/05/2021   Sciatica 11/05/2021   Thrombocytopenia ([place]) 11/05/2021   Type 2 diabetes mellitus with other specified complication ([place]) 52/84/1324   Chronic bilateral low back pain with bilateral sciatica 11/05/2021   Gait abnormality 11/05/2021   Trochanteric bursitis of left hip 11/06/2020   Pain in right knee 10/11/2018   Deviated nasal septum 04/01/2016   Sinusitis, chronic 04/01/2016   Chronic frontal sinusitis 02/04/2016   Nasal turbinate hypertrophy 02/04/2016   Obstructive sleep apnea 02/04/2016   Prostate cancer ([place])    Spinal stenosis in cervical region 04/15/2015   Lateral meniscal tear 03/27/2015   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   Past Medical History:  Diagnosis Date   Acute meniscal tear of right knee    Arthritis    Cancer ([place]) 2015   lymphoma new dx area removed behind right ear   Cancer ([place]) 2010   prostate   Deviated septum    chronic sinusitis, nasal turbinate hypertrophy   Diabetes mellitus without complication ([place])    on metformin   GERD (gastroesophageal reflux disease)    Hypertension     Pneumonia    Sleep apnea    wears CPAP   Wears glasses    Family History  Problem Relation Age of Onset   Heart attack Father    Heart attack Sister    Past Surgical History:  Procedure Laterality Date   back injection     to lower back   BACK SURGERY     lower back   KNEE ARTHROSCOPY Right 03/27/2015   Procedure: RIGHT ARTHROSCOPY KNEE WITH DEBRIDEMENT, PARTIAL LATERAL MENISCECTOMY;  Surgeon: Gaynelle Arabian, MD;  Location: [facility]S;  Service: Orthopedics;  Laterality: Right;   NASAL SEPTOPLASTY W/ TURBINOPLASTY Bilateral 04/01/2016   Procedure: NASAL SEPTOPLASTY WITH BILATERAL INFERIOR  TURBINATE REDUCTION;  Surgeon: Jerrell Belfast, MD;  Location: [facility] OR;  Service: ENT;  Laterality: Bilateral;   PROSTATECTOMY  2010   SINUS ENDO WITH FUSION Right 04/01/2016   Procedure: RIGHT ENDOSCOPIC SINUS SURGERY,AND CONSISTING  OF ANTERIOR ETHMOIDECTOMY;  Surgeon: Jerrell Belfast, MD;  Location: [facility] OR;  Service: ENT;  Laterality: Right;   surgery for collapsed lung Left 35 to 40 years ago   Social History   Occupational History   Not on file  Tobacco Use   Smoking status: Former    Packs/day: 1.00    Years: 20.00    Total pack years: 20.00    Types: Cigarettes   Smokeless tobacco: Never   Tobacco comments:    quit smoking 24 years ago 03/30/16  Substance and Sexual Activity   Alcohol use: No   Drug use: No   Sexual activity: Not on file

## 2022-01-07 ENCOUNTER — Encounter: Payer: Self-pay | Admitting: Physical Medicine and Rehabilitation

## 2022-01-07 ENCOUNTER — Ambulatory Visit: Payer: Self-pay

## 2022-01-07 ENCOUNTER — Ambulatory Visit (INDEPENDENT_AMBULATORY_CARE_PROVIDER_SITE_OTHER): Payer: Medicare Other | Admitting: Physical Medicine and Rehabilitation

## 2022-01-07 VITALS — BP 129/74 | HR 74

## 2022-01-07 DIAGNOSIS — M5416 Radiculopathy, lumbar region: Secondary | ICD-10-CM

## 2022-01-07 MED ORDER — METHYLPREDNISOLONE ACETATE 80 MG/ML IJ SUSP
80.0000 mg | Freq: Once | INTRAMUSCULAR | Status: AC
  Administered 2022-01-07: 80 mg

## 2022-01-07 NOTE — Progress Notes (Signed)
Pt state lower back pain that travels to his buttock that travels to both hip and leg. Pt state standing and lifting makes the pain worse. Pt state he takes pain meds to help ease his pain.  Numeric Pain Rating Scale and Functional Assessment Average Pain 5   In the last MONTH (on 0-10 scale) has pain interfered with the following?  1. General activity like being  able to carry out your everyday physical activities such as walking, climbing stairs, carrying groceries, or moving a chair?  Rating(8)   +Driver, -BT, -Dye Allergies.

## 2022-01-07 NOTE — Patient Instructions (Signed)

## 2022-01-15 DIAGNOSIS — M48062 Spinal stenosis, lumbar region with neurogenic claudication: Secondary | ICD-10-CM | POA: Diagnosis not present

## 2022-01-16 ENCOUNTER — Other Ambulatory Visit: Payer: Self-pay | Admitting: Neurosurgery

## 2022-01-16 NOTE — Procedures (Signed)
Lumbosacral Transforaminal Epidural Steroid Injection - Sub-Pedicular Approach with Fluoroscopic Guidance  Patient: [name]      Date of Birth: [birth year] MRN: [id number] PCP: Lajean Manes, MD      Visit Date: 01/07/2022   Universal Protocol:    Date/Time: 01/07/2022  Consent Given By: the patient  Position: PRONE  Additional Comments: Vital signs were monitored before and after the procedure. Patient was prepped and draped in the usual sterile fashion. The correct patient, procedure, and site was verified.   Injection Procedure Details:   Procedure diagnoses: Lumbar radiculopathy [M54.16]    Meds Administered:  Meds ordered this encounter  Medications   methylPREDNISolone acetate (DEPO-MEDROL) injection 80 mg    Laterality: Bilateral  Location/Site: L3  Needle:5.0 in., 22 ga.  Short bevel or Quincke spinal needle  Needle Placement: Transforaminal  Findings:    -Comments: Excellent flow of contrast along the nerve, nerve root and into the epidural space.  Procedure Details: After squaring off the end-plates to get a true AP view, the C-arm was positioned so that an oblique view of the foramen as noted above was visualized. The target area is just inferior to the "nose of the scotty dog" or sub pedicular. The soft tissues overlying this structure were infiltrated with 2-3 ml. of 1% Lidocaine without Epinephrine.  The spinal needle was inserted toward the target using a "trajectory" view along the fluoroscope beam.  Under AP and lateral visualization, the needle was advanced so it did not puncture dura and was located close the 6 O'Clock position of the pedical in AP tracterory. Biplanar projections were used to confirm position. Aspiration was confirmed to be negative for CSF and/or blood. A 1-2 ml. volume of Isovue-250 was injected and flow of contrast was noted at each level. Radiographs were obtained for documentation purposes.   After attaining the  desired flow of contrast documented above, a 0.5 to 1.0 ml test dose of 0.25% Marcaine was injected into each respective transforaminal space.  The patient was observed for 90 seconds post injection.  After no sensory deficits were reported, and normal lower extremity motor function was noted,   the above injectate was administered so that equal amounts of the injectate were placed at each foramen (level) into the transforaminal epidural space.   Additional Comments:  The patient tolerated the procedure well Dressing: 2 x 2 sterile gauze and Band-Aid    Post-procedure details: Patient was observed during the procedure. Post-procedure instructions were reviewed.  Patient left the clinic in stable condition.

## 2022-01-16 NOTE — Progress Notes (Signed)
[name] - 81 y.o. male MRN [id number]  Date of birth: 1941/07/04  Office Visit Note: Visit Date: 01/07/2022 PCP: Lajean Manes, MD Referred by: Lajean Manes, MD  Subjective: Chief Complaint  Patient presents with   Lower Back - Pain   Right Leg - Pain   Left Leg - Pain   HPI:  [name] is a 81 y.o. male who comes in today at the request of Barnet Pall, FNP for planned Bilateral L3-4 Lumbar Transforaminal epidural steroid injection with fluoroscopic guidance.  The patient has failed conservative care including home exercise, medications, time and activity modification.  This injection will be diagnostic and hopefully therapeutic.  Please see requesting physician notes for further details and justification.   ROS Otherwise per HPI.  Assessment & Plan: Visit Diagnoses:    ICD-10-CM   1. Lumbar radiculopathy  M54.16 XR C-ARM NO REPORT    Epidural Steroid injection    methylPREDNISolone acetate (DEPO-MEDROL) injection 80 mg      Plan: No additional findings.   Meds & Orders:  Meds ordered this encounter  Medications   methylPREDNISolone acetate (DEPO-MEDROL) injection 80 mg    Orders Placed This Encounter  Procedures   XR C-ARM NO REPORT   Epidural Steroid injection    Follow-up: Return if symptoms worsen or fail to improve.   Procedures: No procedures performed  Lumbosacral Transforaminal Epidural Steroid Injection - Sub-Pedicular Approach with Fluoroscopic Guidance  Patient: [name]      Date of Birth: 1941/04/12 MRN: [id number] PCP: Lajean Manes, MD      Visit Date: 01/07/2022   Universal Protocol:    Date/Time: 01/07/2022  Consent Given By: the patient  Position: PRONE  Additional Comments: Vital signs were monitored before and after the procedure. Patient was prepped and draped in the usual sterile fashion. The correct patient, procedure, and site was verified.   Injection Procedure Details:   Procedure  diagnoses: Lumbar radiculopathy [M54.16]    Meds Administered:  Meds ordered this encounter  Medications   methylPREDNISolone acetate (DEPO-MEDROL) injection 80 mg    Laterality: Bilateral  Location/Site: L3  Needle:5.0 in., 22 ga.  Short bevel or Quincke spinal needle  Needle Placement: Transforaminal  Findings:    -Comments: Excellent flow of contrast along the nerve, nerve root and into the epidural space.  Procedure Details: After squaring off the end-plates to get a true AP view, the C-arm was positioned so that an oblique view of the foramen as noted above was visualized. The target area is just inferior to the "nose of the scotty dog" or sub pedicular. The soft tissues overlying this structure were infiltrated with 2-3 ml. of 1% Lidocaine without Epinephrine.  The spinal needle was inserted toward the target using a "trajectory" view along the fluoroscope beam.  Under AP and lateral visualization, the needle was advanced so it did not puncture dura and was located close the 6 O'Clock position of the pedical in AP tracterory. Biplanar projections were used to confirm position. Aspiration was confirmed to be negative for CSF and/or blood. A 1-2 ml. volume of Isovue-250 was injected and flow of contrast was noted at each level. Radiographs were obtained for documentation purposes.   After attaining the desired flow of contrast documented above, a 0.5 to 1.0 ml test dose of 0.25% Marcaine was injected into each respective transforaminal space.  The patient was observed for 90 seconds post injection.  After no sensory deficits were reported, and normal lower extremity  motor function was noted,   the above injectate was administered so that equal amounts of the injectate were placed at each foramen (level) into the transforaminal epidural space.   Additional Comments:  The patient tolerated the procedure well Dressing: 2 x 2 sterile gauze and Band-Aid    Post-procedure  details: Patient was observed during the procedure. Post-procedure instructions were reviewed.  Patient left the clinic in stable condition.    Clinical History: EXAM: MRI LUMBAR SPINE WITHOUT CONTRAST   TECHNIQUE: Multiplanar, multisequence MR imaging of the lumbar spine was performed. No intravenous contrast was administered.   COMPARISON:  X-ray lumbar 05/12/2021; MR lumbar 01/13/2014.   FINDINGS: Segmentation:  Standard.   Alignment:  Grade 1 anterolisthesis L3 on L4-L4 on L5.   Vertebrae: No acute fracture, evidence of discitis, or aggressive bone lesion.   Conus medullaris and cauda equina: Conus extends to the T12-L1 level. Conus and cauda equina appear normal.   Paraspinal and other soft tissues: No acute paraspinal abnormality.   Disc levels:   Disc spaces: Disc desiccation throughout lumbar spine with relative sparing at L5-S1.   T12-L1: Mild broad-based disc bulge with a small central disc protrusion. No foraminal or central canal stenosis.   L1-L2: Minimal broad-based disc bulge. No foraminal or central canal stenosis.   L2-L3: Mild broad-based disc bulge with a broad right paracentral disc protrusion. Moderate right and moderate-severe left foraminal stenosis. Mild spinal stenosis. Prior left laminectomy.   L3-L4: Broad-based disc bulge flattening the ventral thecal sac. Moderate bilateral facet arthropathy. Severe spinal stenosis. Moderate bilateral foraminal stenosis.   L4-L5: Broad-based disc bulge. Severe bilateral facet arthropathy. Mild spinal stenosis scratch them moderate spinal stenosis. Moderate-severe bilateral foraminal stenosis.   L5-S1: No significant disc bulge. No neural foraminal stenosis. No central canal stenosis.   IMPRESSION: 1. Diffuse lumbar spine spondylosis as described above which has progressed compared with 01/13/2014. 2.  No acute osseous injury of the lumbar spine.     Electronically Signed   By: Kathreen Devoid  M.D.   On: 06/04/2021 15:50     Objective:  VS:  HT:    WT:   BMI:     BP:129/74  HR:74bpm  TEMP: ( )  RESP:  Physical Exam Vitals and nursing note reviewed.  Constitutional:      General: He is not in acute distress.    Appearance: Normal appearance. He is not ill-appearing.  HENT:     Head: Normocephalic and atraumatic.     Right Ear: External ear normal.     Left Ear: External ear normal.     Nose: No congestion.  Eyes:     Extraocular Movements: Extraocular movements intact.  Cardiovascular:     Rate and Rhythm: Normal rate.     Pulses: Normal pulses.  Pulmonary:     Effort: Pulmonary effort is normal. No respiratory distress.  Abdominal:     General: There is no distension.     Palpations: Abdomen is soft.  Musculoskeletal:        General: No tenderness or signs of injury.     Cervical back: Neck supple.     Right lower leg: No edema.     Left lower leg: No edema.     Comments: Patient has good distal strength without clonus.  Skin:    Findings: No erythema or rash.  Neurological:     General: No focal deficit present.     Mental Status: He is alert and oriented to person,  place, and time.     Sensory: No sensory deficit.     Motor: No weakness or abnormal muscle tone.     Coordination: Coordination normal.  Psychiatric:        Mood and Affect: Mood normal.        Behavior: Behavior normal.      Imaging: No results found.

## 2022-01-21 ENCOUNTER — Inpatient Hospital Stay: Payer: Medicare Other | Attending: Oncology | Admitting: Oncology

## 2022-01-21 VITALS — BP 142/71 | HR 99 | Temp 98.2°F | Resp 18 | Ht 71.0 in | Wt 223.0 lb

## 2022-01-21 DIAGNOSIS — Z79899 Other long term (current) drug therapy: Secondary | ICD-10-CM | POA: Diagnosis not present

## 2022-01-21 DIAGNOSIS — Z8546 Personal history of malignant neoplasm of prostate: Secondary | ICD-10-CM | POA: Diagnosis not present

## 2022-01-21 DIAGNOSIS — I10 Essential (primary) hypertension: Secondary | ICD-10-CM | POA: Diagnosis not present

## 2022-01-21 DIAGNOSIS — C884 Extranodal marginal zone B-cell lymphoma of mucosa-associated lymphoid tissue [MALT-lymphoma]: Secondary | ICD-10-CM | POA: Insufficient documentation

## 2022-01-21 DIAGNOSIS — E119 Type 2 diabetes mellitus without complications: Secondary | ICD-10-CM | POA: Insufficient documentation

## 2022-01-21 DIAGNOSIS — Z9079 Acquired absence of other genital organ(s): Secondary | ICD-10-CM | POA: Diagnosis not present

## 2022-01-21 NOTE — Progress Notes (Signed)
  [facility] OFFICE PROGRESS NOTE   Diagnosis: Marginal zone lymphoma  INTERVAL HISTORY:   [name] returns as scheduled.  He generally feels well.  No fever, night sweats, palpable lymph nodes, or change at the right ear.  He has "sciatica "he is scheduled for a laminectomy procedure by Dr. Annette Stable next week.  Objective:  Vital signs in last 24 hours:  Blood pressure (!) 142/71, pulse 99, temperature 98.2 F (36.8 C), temperature source Oral, resp. rate 18, height '5\' 11"'$  (1.803 m), weight 223 lb (101.2 kg), SpO2 100 %.    HEENT: Neck without mass Lymphatics: No cervical, supraclavicular, axillary, or inguinal nodes Resp: Lungs clear bilaterally Cardio: Regular rate and rhythm with premature beats GI: No hepatosplenomegaly Vascular: No leg edema  Skin: No evidence for recurrent tumor at the right retroauricular region  Lab Results:  Lab Results  Component Value Date   WBC 7.2 03/30/2016   HGB 14.1 03/30/2016   HCT 41.1 03/30/2016   MCV 89.3 03/30/2016   PLT 154 03/30/2016   NEUTROABS 4.4 04/30/2014    CMP  Lab Results  Component Value Date   NA 136 03/30/2016   K 4.1 03/30/2016   CL 103 03/30/2016   CO2 26 03/30/2016   GLUCOSE 208 (H) 03/30/2016   BUN 14 03/30/2016   CREATININE 1.19 03/30/2016   CALCIUM 9.5 03/30/2016   PROT 8.3 04/30/2014   ALBUMIN 4.2 04/30/2014   AST 18 04/30/2014   ALT 22 04/30/2014   ALKPHOS 58 04/30/2014   BILITOT 0.35 04/30/2014   GFRNONAA 58 (L) 03/30/2016   GFRAA >60 03/30/2016     Medications: I have reviewed the patient's current medications.   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 Prostate cancer diagnosed in 2009, status post prostatectomy    3.   Diabetes   4.   Hypertension   5.   hyperlipidemiq   6.  Zoster rash right forehead/periorbital region April 2018  7.  Positional vertigo June 2021    Disposition: [name] remains in clinical remission from non-Hodgkin's lymphoma.  He is now 6 years out from completing radiation to the right retroauricular lesion.  He will return for an office visit in 1 year.  Betsy Coder, MD  01/21/2022  10:02 AM

## 2022-01-23 NOTE — Pre-Procedure Instructions (Addendum)
Surgical Instructions    Your procedure is scheduled on Tuesday, July 11th.  Report to [facility] Main Entrance "A" at 7:45 A.M., then check in with the Admitting office.  Call this number if you have problems the morning of surgery:  [phone]   If you have any questions prior to your surgery date call [phone]: Open Monday-Friday 8am-4pm    Remember:  Do not eat or drink after midnight the night before your surgery    Take these medicines the morning of surgery with A SIP OF WATER:  amLODipine (NORVASC)  atenolol (TENORMIN) Eye drops   Take these medications as needed cetirizine (ZYRTEC)  pantoprazole (PROTONIX) Nasal spray  Follow your surgeon's instructions on when to stop Aspirin.  If no instructions were given by your surgeon then you will need to call the office to get those instructions.    As of today, STOP taking any Aleve, Naproxen, Ibuprofen, Motrin, Advil, Goody's, BC's, all herbal medications, fish oil, and all vitamins.  WHAT DO I DO ABOUT MY DIABETES MEDICATION?  Do not take metFORMIN (GLUCOPHAGE)  the morning of surgery.  HOW TO MANAGE YOUR DIABETES BEFORE AND AFTER SURGERY  Why is it important to control my blood sugar before and after surgery? Improving blood sugar levels before and after surgery helps healing and can limit problems. A way of improving blood sugar control is eating a healthy diet by:  Eating less sugar and carbohydrates  Increasing activity/exercise  Talking with your doctor about reaching your blood sugar goals High blood sugars (greater than 180 mg/dL) can raise your risk of infections and slow your recovery, so you will need to focus on controlling your diabetes during the weeks before surgery. Make sure that the doctor who takes care of your diabetes knows about your planned surgery including the date and location.  How do I manage my blood sugar before surgery? Check your blood sugar at least 4 times a day, starting 2 days  before surgery, to make sure that the level is not too high or low.  Check your blood sugar the morning of your surgery when you wake up and every 2 hours until you get to the Short Stay unit.  If your blood sugar is less than 70 mg/dL, you will need to treat for low blood sugar: Do not take insulin. Treat a low blood sugar (less than 70 mg/dL) with  cup of clear juice (cranberry or apple), 4 glucose tablets, OR glucose gel. Recheck blood sugar in 15 minutes after treatment (to make sure it is greater than 70 mg/dL). If your blood sugar is not greater than 70 mg/dL on recheck, call [phone] for further instructions. Report your blood sugar to the short stay nurse when you get to Short Stay.  If you are admitted to the hospital after surgery: Your blood sugar will be checked by the staff and you will probably be given insulin after surgery (instead of oral diabetes medicines) to make sure you have good blood sugar levels. The goal for blood sugar control after surgery is 80-180 mg/dL.            Do not wear jewelry. Do not wear lotions, powders, colognes, or deodorant. Men may shave face and neck. Do not bring valuables to the hospital.  [facility] is not responsible for any belongings or valuables. .   Do NOT Smoke (Tobacco/Vaping)  24 hours prior to your procedure  If you use a CPAP at night, you may bring  your mask for your overnight stay.   Contacts, glasses, hearing aids, dentures or partials may not be worn into surgery, please bring cases for these belongings   For patients admitted to the hospital, discharge time will be determined by your treatment team.   Patients discharged the day of surgery will not be allowed to drive home, and someone needs to stay with them for 24 hours.   SURGICAL WAITING ROOM VISITATION Patients having surgery or a procedure in a hospital may have two support people. Children under the age of 18 must have an adult with them who is not the  patient. They may stay in the waiting area during the procedure and may switch out with other visitors. If the patient needs to stay at the hospital during part of their recovery, the visitor guidelines for inpatient rooms apply.  Please refer to the [facility] website for the visitor guidelines for Inpatients (after your surgery is over and you are in a regular room).       Special instructions:    Oral Hygiene is also important to reduce your risk of infection.  Remember - BRUSH YOUR TEETH THE MORNING OF SURGERY WITH YOUR REGULAR TOOTHPASTE   [place]- Preparing For Surgery  Before surgery, you can play an important role. Because skin is not sterile, your skin needs to be as free of germs as possible. You can reduce the number of germs on your skin by washing with CHG (chlorahexidine gluconate) Soap before surgery.  CHG is an antiseptic cleaner which kills germs and bonds with the skin to continue killing germs even after washing.     Please do not use if you have an allergy to CHG or antibacterial soaps. If your skin becomes reddened/irritated stop using the CHG.  Do not shave (including legs and underarms) for at least 48 hours prior to first CHG shower. It is OK to shave your face.  Please follow these instructions carefully.     Shower the NIGHT BEFORE SURGERY and the MORNING OF SURGERY with CHG Soap.   If you chose to wash your hair, wash your hair first as usual with your normal shampoo. After you shampoo, rinse your hair and body thoroughly to remove the shampoo.  Then [employer] and genitals (private parts) with your normal soap and rinse thoroughly to remove soap.  After that Use CHG Soap as you would any other liquid soap. You can apply CHG directly to the skin and wash gently with a scrungie or a clean washcloth.   Apply the CHG Soap to your body ONLY FROM THE NECK DOWN.  Do not use on open wounds or open sores. Avoid contact with your eyes, ears, mouth and genitals  (private parts). Wash Face and genitals (private parts)  with your normal soap.   Wash thoroughly, paying special attention to the area where your surgery will be performed.  Thoroughly rinse your body with warm water from the neck down.  DO NOT shower/wash with your normal soap after using and rinsing off the CHG Soap.  Pat yourself dry with a CLEAN TOWEL.  Wear CLEAN PAJAMAS to bed the night before surgery  Place CLEAN SHEETS on your bed the night before your surgery  DO NOT SLEEP WITH PETS.   Day of Surgery:  Take a shower with CHG soap. Wear Clean/Comfortable clothing the morning of surgery Do not apply any deodorants/lotions.   Remember to brush your teeth WITH YOUR REGULAR TOOTHPASTE.    If  you received a COVID test during your pre-op visit, it is requested that you wear a mask when out in public, stay away from anyone that may not be feeling well, and notify your surgeon if you develop symptoms. If you have been in contact with anyone that has tested positive in the last 10 days, please notify your surgeon.    Please read over the following fact sheets that you were given.

## 2022-01-26 ENCOUNTER — Encounter (HOSPITAL_COMMUNITY): Payer: Self-pay

## 2022-01-26 ENCOUNTER — Other Ambulatory Visit: Payer: Self-pay

## 2022-01-26 ENCOUNTER — Encounter (HOSPITAL_COMMUNITY)
Admission: RE | Admit: 2022-01-26 | Discharge: 2022-01-26 | Disposition: A | Discharge status: Home / Self Care | Payer: Medicare Other | Source: Ambulatory Visit | Attending: Neurosurgery | Admitting: Neurosurgery

## 2022-01-26 VITALS — BP 126/74 | HR 56 | Temp 97.9°F | Resp 17 | Ht 71.0 in | Wt 221.0 lb

## 2022-01-26 DIAGNOSIS — Z87891 Personal history of nicotine dependence: Secondary | ICD-10-CM | POA: Diagnosis not present

## 2022-01-26 DIAGNOSIS — Z01818 Encounter for other preprocedural examination: Secondary | ICD-10-CM

## 2022-01-26 DIAGNOSIS — I10 Essential (primary) hypertension: Secondary | ICD-10-CM | POA: Diagnosis not present

## 2022-01-26 DIAGNOSIS — I2510 Atherosclerotic heart disease of native coronary artery without angina pectoris: Secondary | ICD-10-CM | POA: Diagnosis not present

## 2022-01-26 DIAGNOSIS — Z8679 Personal history of other diseases of the circulatory system: Secondary | ICD-10-CM | POA: Insufficient documentation

## 2022-01-26 DIAGNOSIS — E119 Type 2 diabetes mellitus without complications: Secondary | ICD-10-CM | POA: Diagnosis not present

## 2022-01-26 DIAGNOSIS — I350 Nonrheumatic aortic (valve) stenosis: Secondary | ICD-10-CM | POA: Insufficient documentation

## 2022-01-26 DIAGNOSIS — K219 Gastro-esophageal reflux disease without esophagitis: Secondary | ICD-10-CM | POA: Diagnosis not present

## 2022-01-26 DIAGNOSIS — Z7984 Long term (current) use of oral hypoglycemic drugs: Secondary | ICD-10-CM | POA: Insufficient documentation

## 2022-01-26 DIAGNOSIS — G4733 Obstructive sleep apnea (adult) (pediatric): Secondary | ICD-10-CM | POA: Insufficient documentation

## 2022-01-26 DIAGNOSIS — Z9989 Dependence on other enabling machines and devices: Secondary | ICD-10-CM | POA: Diagnosis not present

## 2022-01-26 LAB — CBC
HCT: 35.8 % — ABNORMAL LOW (ref 39.0–52.0)
Hemoglobin: 12.1 g/dL — ABNORMAL LOW (ref 13.0–17.0)
MCH: 30.9 pg (ref 26.0–34.0)
MCHC: 33.8 g/dL (ref 30.0–36.0)
MCV: 91.6 fL (ref 80.0–100.0)
Platelets: 161 10*3/uL (ref 150–400)
RBC: 3.91 MIL/uL — ABNORMAL LOW (ref 4.22–5.81)
RDW: 14.3 % (ref 11.5–15.5)
WBC: 6.6 10*3/uL (ref 4.0–10.5)
nRBC: 0 % (ref 0.0–0.2)

## 2022-01-26 LAB — GLUCOSE, CAPILLARY: Glucose-Capillary: 187 mg/dL — ABNORMAL HIGH (ref 70–99)

## 2022-01-26 LAB — BASIC METABOLIC PANEL
Anion gap: 9 (ref 5–15)
BUN: 17 mg/dL (ref 8–23)
CO2: 24 mmol/L (ref 22–32)
Calcium: 9.7 mg/dL (ref 8.9–10.3)
Chloride: 103 mmol/L (ref 98–111)
Creatinine, Ser: 1.19 mg/dL (ref 0.61–1.24)
GFR, Estimated: >60 mL/min (ref >60)
Glucose, Bld: 174 mg/dL — ABNORMAL HIGH (ref 70–99)
Potassium: 4 mmol/L (ref 3.5–5.1)
Sodium: 136 mmol/L (ref 135–145)

## 2022-01-26 LAB — HEMOGLOBIN A1C
Hgb A1c MFr Bld: 7.6 % — ABNORMAL HIGH (ref 4.8–5.6)
Mean Plasma Glucose: 171.42 mg/dL

## 2022-01-26 LAB — SURGICAL PCR SCREEN
MRSA, PCR: NEGATIVE
Staphylococcus aureus: NEGATIVE

## 2022-01-26 NOTE — Anesthesia Preprocedure Evaluation (Addendum)
Anesthesia Evaluation  Patient identified by MRN, date of birth, ID band Patient awake    Reviewed: Allergy & Precautions, NPO status , Patient's Chart, lab work & pertinent test results, reviewed documented beta blocker date and time   History of Anesthesia Complications Negative for: history of anesthetic complications  Airway Mallampati: IV  TM Distance: >3 FB Neck ROM: Full  Mouth opening: Limited Mouth Opening  Dental  (+) Dental Advisory Given, Missing, Chipped   Pulmonary sleep apnea and Continuous Positive Airway Pressure Ventilation , former smoker,    breath sounds clear to auscultation       Cardiovascular hypertension, Pt. on medications and Pt. on home beta blockers (-) angina Rhythm:Regular Rate:Normal     Neuro/Psych Back pain    GI/Hepatic Neg liver ROS, GERD  Medicated and Controlled,  Endo/Other  diabetes, Oral Hypoglycemic Agentsobese  Renal/GU Renal InsufficiencyRenal disease     Musculoskeletal   Abdominal (+) + obese,   Peds  Hematology negative hematology ROS (+)   Anesthesia Other Findings Non-Hodgkin's lymphoma R retroauricular nodes: XRT  Reproductive/Obstetrics                           Anesthesia Physical Anesthesia Plan  ASA: 3  Anesthesia Plan: General   Post-op Pain Management: Tylenol PO (pre-op)*   Induction: Intravenous  PONV Risk Score and Plan: 2 and Ondansetron and Dexamethasone  Airway Management Planned: Oral ETT and Video Laryngoscope Planned  Additional Equipment: None  Intra-op Plan:   Post-operative Plan: Extubation in OR  Informed Consent: I have reviewed the patients History and Physical, chart, labs and discussed the procedure including the risks, benefits and alternatives for the proposed anesthesia with the patient or authorized representative who has indicated his/her understanding and acceptance.     Dental advisory  given  Plan Discussed with: CRNA and Surgeon  Anesthesia Plan Comments: (PAT note written 01/26/2022 by Myra Gianotti, PA-C. )      Anesthesia Quick Evaluation

## 2022-01-26 NOTE — Progress Notes (Signed)
PCP - Dr. Lajean Manes Cardiologist - Denies  PPM/ICD - Denies Device Orders - n/a Rep Notified - n/a  Chest x-ray - Denies EKG - 01/26/2022 Stress Test - per patient, 15+ years ago at another health system ECHO - denies Cardiac Cath - denies  Sleep Study - Yes. 10+ years ago CPAP - Per patient, uses every night. Not sure what his pressure settings are.  Pt is is a Type 2 Diabetic. He does not own a CBG meter at home and does not check his blood sugars on a regular basis. He does not know what his fasting/normal blood sugar level is . His CBG was 187 at his PAT appointment. A1c drawn at PAT visit.  Blood Thinner Instructions: n/a Aspirin Instructions: Per patient, he has already stopped his ASA per his MD instructions.  ERAS Protcol - No. NPO after midnight  COVID TEST- n/a   Anesthesia review: Yes. Myra Gianotti, PA-C called about abnormal EKG. Heart rate resulted at 47. Pt asymptomatic. BP 126/74 at PAT visit. Pt states his heart rate is usually around 60 and he had not had breakfast yet. Made patient aware of low heart rate and educated to seek medical attention if he becomes symptomatic. Patient verbalized understanding.   Patient denies shortness of breath, fever, cough and chest pain at PAT appointment   All instructions explained to the patient, with a verbal understanding of the material. Patient agrees to go over the instructions while at home for a better understanding. Patient also instructed to self quarantine after being tested for COVID-19. The opportunity to ask questions was provided.

## 2022-01-26 NOTE — Progress Notes (Signed)
Anesthesia Chart Review:  Case: [id number] Date/Time: 01/27/22 0930   Procedure: Laminectomy and Foraminotomy - bilateral - L3-L4 - L4-L5 (Bilateral: Back) - 3C   Anesthesia type: General   Pre-op diagnosis: Stenosis   Location: [facility] OR ROOM 39 / [facility]   Surgeons: Earnie Larsson, MD       DISCUSSION: Patient is an 81 year old male scheduled for the above procedure.  History includes former smoker (quit 07/20/88), HTN, DM2, OSA (uses CPAP), GERD, lymphoma, prostate cancer (s/p robotic assisted laparoscopic radical prostatectomy 10/29/08), spinal surgery, pneumothorax (> 35 years ago, s/p surgery), nasal septoplasty/inferior turbinate reduction (04/01/16).   HR 56 with PAT vitals and 47 bpm on EKG (SB, first degree AV block). BP 126/74. He was asymptomatic, and reported baseline HR typically ~ 60. He is on atenolol (appears to have been on since at least 02/2015). EKG on 03/30/16 showed SB at 49 bpm and rate 55 bpm on 03/20/15 tracing. SB appears chronic and has been symptomatic. Should he ever develop symptoms of bradycardia then advised to seek medical advice.    He saw his oncologist Dr. 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 S/p radiation to right ear 08/08/15-08/28/15.  He remained in clinical remission.  1 year follow-up planned. He is aware of surgery plans.  He reported that his aspirin is already on hold per MD instructions.  Anesthesia team to evaluate on the day of surgery.  VS: BP 126/74   Pulse (!) 56   Temp 36.6 C   Resp 17   Ht '5\' 11"'$  (1.803 m)   Wt 100.2 kg   SpO2 98%   BMI 30.82 kg/m    PROVIDERS: Lajean Manes, MD is PCP  Betsy Coder, MD is HEM-ONC   LABS: Labs reviewed: Acceptable for surgery. (all labs ordered are listed, but only abnormal results are displayed)  Labs Reviewed  GLUCOSE, CAPILLARY - Abnormal; Notable for the following components:      Result Value    Glucose-Capillary 187 (*)    All other components within normal limits  BASIC METABOLIC PANEL - Abnormal; Notable for the following components:   Glucose, Bld 174 (*)    All other components within normal limits  HEMOGLOBIN A1C - Abnormal; Notable for the following components:   Hgb A1c MFr Bld 7.6 (*)    All other components within normal limits  CBC - Abnormal; Notable for the following components:   RBC 3.91 (*)    Hemoglobin 12.1 (*)    HCT 35.8 (*)    All other components within normal limits  SURGICAL PCR SCREEN    IMAGES: MRI L-spine 06/04/21: IMPRESSION: 1. Diffuse lumbar spine spondylosis as described above which has progressed compared with 01/13/2014. 2.  No acute osseous injury of the lumbar spine.    EKG: 01/26/22: Sinus bradycardia at 47 bpm 1st degree A-V block Possible Anterior infarct , age undetermined Abnormal ECG   CV: Reported a stress test greater than 15 years ago at an output side facility.  Past Medical History:  Diagnosis Date   Acute meniscal tear of right knee    Arthritis    Cancer ([place]) 2015   lymphoma new dx area removed behind right ear   Cancer ([place]) 2010   prostate   Deviated septum    chronic sinusitis, nasal turbinate hypertrophy   Diabetes mellitus without complication ([place])    on metformin   GERD (gastroesophageal reflux disease)  Hypertension    Pneumonia    Sleep apnea    wears CPAP   Wears glasses     Past Surgical History:  Procedure Laterality Date   back injection     to lower back   BACK SURGERY     lower back   EYE SURGERY Bilateral 2019   cataract   KNEE ARTHROSCOPY Right 03/27/2015   Procedure: RIGHT ARTHROSCOPY KNEE WITH DEBRIDEMENT, PARTIAL LATERAL MENISCECTOMY;  Surgeon: Gaynelle Arabian, MD;  Location: [facility];  Service: Orthopedics;  Laterality: Right;   NASAL SEPTOPLASTY W/ TURBINOPLASTY Bilateral 04/01/2016   Procedure: NASAL SEPTOPLASTY WITH BILATERAL INFERIOR  TURBINATE REDUCTION;  Surgeon: Jerrell Belfast, MD;  Location: [place];  Service: ENT;  Laterality: Bilateral;   PROSTATECTOMY  07/20/2008   SINUS ENDO WITH FUSION Right 04/01/2016   Procedure: RIGHT ENDOSCOPIC SINUS SURGERY,AND CONSISTING  OF ANTERIOR ETHMOIDECTOMY;  Surgeon: Jerrell Belfast, MD;  Location: [facility] OR;  Service: ENT;  Laterality: Right;   surgery for collapsed lung Left 35 to 40 years ago    MEDICATIONS:  amLODipine (NORVASC) 10 MG tablet   aspirin EC 81 MG tablet   atenolol (TENORMIN) 50 MG tablet   cetirizine (ZYRTEC) 10 MG tablet   lisinopril (PRINIVIL,ZESTRIL) 20 MG tablet   Melatonin 3 MG TABS   metFORMIN (GLUCOPHAGE) 1000 MG tablet   NON FORMULARY   pantoprazole (PROTONIX) 40 MG tablet   pregabalin (LYRICA) 50 MG capsule   rosuvastatin (CRESTOR) 10 MG tablet   sodium chloride (OCEAN) 0.65 % SOLN nasal spray   spironolactone (ALDACTONE) 25 MG tablet   timolol (TIMOPTIC) 0.5 % ophthalmic solution   No current facility-administered medications for this encounter.    Myra Gianotti, PA-C Surgical Short Stay/Anesthesiology [facility] Phone [phone] [facility] Phone [phone] 01/26/2022 2:42 PM

## 2022-01-27 ENCOUNTER — Other Ambulatory Visit: Payer: Self-pay

## 2022-01-27 ENCOUNTER — Ambulatory Visit (HOSPITAL_COMMUNITY)
Admission: RE | Disposition: A | Discharge status: Home / Self Care | Payer: Self-pay | Source: Home / Self Care | Attending: Neurosurgery

## 2022-01-27 ENCOUNTER — Ambulatory Visit (HOSPITAL_BASED_OUTPATIENT_CLINIC_OR_DEPARTMENT_OTHER): Payer: Medicare Other | Admitting: Anesthesiology

## 2022-01-27 ENCOUNTER — Ambulatory Visit (HOSPITAL_COMMUNITY): Payer: Medicare Other | Admitting: Vascular Surgery

## 2022-01-27 ENCOUNTER — Ambulatory Visit: Payer: Medicare Other | Admitting: Orthopaedic Surgery

## 2022-01-27 ENCOUNTER — Observation Stay (HOSPITAL_COMMUNITY)
Admission: RE | Admit: 2022-01-27 | Discharge: 2022-01-28 | Disposition: A | Discharge status: Home / Self Care | Payer: Medicare Other | Attending: Neurosurgery | Admitting: Neurosurgery

## 2022-01-27 ENCOUNTER — Ambulatory Visit (HOSPITAL_COMMUNITY): Payer: Medicare Other

## 2022-01-27 ENCOUNTER — Encounter (HOSPITAL_COMMUNITY): Payer: Self-pay | Admitting: Neurosurgery

## 2022-01-27 DIAGNOSIS — Z87891 Personal history of nicotine dependence: Secondary | ICD-10-CM | POA: Insufficient documentation

## 2022-01-27 DIAGNOSIS — E119 Type 2 diabetes mellitus without complications: Secondary | ICD-10-CM | POA: Diagnosis not present

## 2022-01-27 DIAGNOSIS — Z7982 Long term (current) use of aspirin: Secondary | ICD-10-CM | POA: Diagnosis not present

## 2022-01-27 DIAGNOSIS — Z7984 Long term (current) use of oral hypoglycemic drugs: Secondary | ICD-10-CM | POA: Diagnosis not present

## 2022-01-27 DIAGNOSIS — G4733 Obstructive sleep apnea (adult) (pediatric): Secondary | ICD-10-CM

## 2022-01-27 DIAGNOSIS — Z8546 Personal history of malignant neoplasm of prostate: Secondary | ICD-10-CM | POA: Insufficient documentation

## 2022-01-27 DIAGNOSIS — I10 Essential (primary) hypertension: Secondary | ICD-10-CM

## 2022-01-27 DIAGNOSIS — Z96651 Presence of right artificial knee joint: Secondary | ICD-10-CM | POA: Diagnosis not present

## 2022-01-27 DIAGNOSIS — M48062 Spinal stenosis, lumbar region with neurogenic claudication: Secondary | ICD-10-CM | POA: Diagnosis not present

## 2022-01-27 DIAGNOSIS — Z981 Arthrodesis status: Secondary | ICD-10-CM | POA: Diagnosis not present

## 2022-01-27 DIAGNOSIS — Z8572 Personal history of non-Hodgkin lymphomas: Secondary | ICD-10-CM | POA: Insufficient documentation

## 2022-01-27 DIAGNOSIS — Z9989 Dependence on other enabling machines and devices: Secondary | ICD-10-CM | POA: Diagnosis not present

## 2022-01-27 DIAGNOSIS — Z79899 Other long term (current) drug therapy: Secondary | ICD-10-CM | POA: Diagnosis not present

## 2022-01-27 HISTORY — PX: LUMBAR LAMINECTOMY/DECOMPRESSION MICRODISCECTOMY: SHX5026

## 2022-01-27 LAB — GLUCOSE, CAPILLARY
Glucose-Capillary: 175 mg/dL — ABNORMAL HIGH (ref 70–99)
Glucose-Capillary: 165 mg/dL — ABNORMAL HIGH (ref 70–99)
Glucose-Capillary: 166 mg/dL — ABNORMAL HIGH (ref 70–99)
Glucose-Capillary: 298 mg/dL — ABNORMAL HIGH (ref 70–99)
Glucose-Capillary: 291 mg/dL — ABNORMAL HIGH (ref 70–99)

## 2022-01-27 SURGERY — LUMBAR LAMINECTOMY/DECOMPRESSION MICRODISCECTOMY 2 LEVELS
Anesthesia: General | Site: Back | Laterality: Bilateral

## 2022-01-27 MED ORDER — DEXAMETHASONE SODIUM PHOSPHATE 10 MG/ML IJ SOLN
INTRAMUSCULAR | Status: AC
Start: 2022-01-27 — End: ?
  Filled 2022-01-27: qty 1

## 2022-01-27 MED ORDER — CHLORHEXIDINE GLUCONATE 0.12 % MT SOLN
15.0000 mL | Freq: Once | OROMUCOSAL | Status: AC
  Administered 2022-01-27: 15 mL via OROMUCOSAL
  Filled 2022-01-27: qty 15

## 2022-01-27 MED ORDER — ACETAMINOPHEN 650 MG RE SUPP: 650.0000 mg | RECTAL | Status: DC | PRN

## 2022-01-27 MED ORDER — SUGAMMADEX SODIUM 200 MG/2ML IV SOLN
INTRAVENOUS | Status: DC | PRN
  Administered 2022-01-27 (×2): 100 mg via INTRAVENOUS

## 2022-01-27 MED ORDER — FENTANYL CITRATE (PF) 250 MCG/5ML IJ SOLN
INTRAMUSCULAR | Status: AC
  Filled 2022-01-27: qty 5

## 2022-01-27 MED ORDER — MIDAZOLAM HCL 2 MG/2ML IJ SOLN: 0.5000 mg | Freq: Once | INTRAMUSCULAR | Status: DC | PRN

## 2022-01-27 MED ORDER — PROMETHAZINE HCL 25 MG/ML IJ SOLN: 6.2500 mg | INTRAMUSCULAR | Status: DC | PRN

## 2022-01-27 MED ORDER — BUPIVACAINE HCL (PF) 0.25 % IJ SOLN
INTRAMUSCULAR | Status: DC | PRN
  Administered 2022-01-27: 20 mL

## 2022-01-27 MED ORDER — HYDROCODONE-ACETAMINOPHEN 10-325 MG PO TABS
2.0000 | ORAL_TABLET | ORAL | Status: DC | PRN
  Administered 2022-01-28 (×2): 2 via ORAL
  Filled 2022-01-27 (×2): qty 2

## 2022-01-27 MED ORDER — SODIUM CHLORIDE 0.9% FLUSH: 3.0000 mL | INTRAVENOUS | Status: DC | PRN

## 2022-01-27 MED ORDER — LIDOCAINE 2% (20 MG/ML) 5 ML SYRINGE
INTRAMUSCULAR | Status: AC
  Filled 2022-01-27: qty 5

## 2022-01-27 MED ORDER — LISINOPRIL 20 MG PO TABS
20.0000 mg | ORAL_TABLET | Freq: Every morning | ORAL | Status: DC
  Administered 2022-01-27 – 2022-01-28 (×2): 20 mg via ORAL
  Filled 2022-01-27 (×2): qty 1

## 2022-01-27 MED ORDER — THROMBIN (RECOMBINANT) 5000 UNITS EX SOLR
CUTANEOUS | Status: DC | PRN
  Administered 2022-01-27: 10 mL via TOPICAL

## 2022-01-27 MED ORDER — SODIUM CHLORIDE 0.9 % IV SOLN: 250.0000 mL | INTRAVENOUS | Status: DC

## 2022-01-27 MED ORDER — OXYCODONE HCL 5 MG/5ML PO SOLN: 5.0000 mg | Freq: Once | ORAL | Status: DC | PRN

## 2022-01-27 MED ORDER — PROPOFOL 10 MG/ML IV BOLUS
INTRAVENOUS | Status: DC | PRN
  Administered 2022-01-27: 100 mg via INTRAVENOUS

## 2022-01-27 MED ORDER — ONDANSETRON HCL 4 MG/2ML IJ SOLN: 4.0000 mg | Freq: Four times a day (QID) | INTRAMUSCULAR | Status: DC | PRN

## 2022-01-27 MED ORDER — LORATADINE 10 MG PO TABS: 10.0000 mg | ORAL_TABLET | Freq: Every day | ORAL | Status: DC

## 2022-01-27 MED ORDER — SODIUM CHLORIDE 0.9% FLUSH: 3.0000 mL | Freq: Two times a day (BID) | INTRAVENOUS | Status: DC

## 2022-01-27 MED ORDER — EPHEDRINE SULFATE-NACL 50-0.9 MG/10ML-% IV SOSY
PREFILLED_SYRINGE | INTRAVENOUS | Status: DC | PRN
  Administered 2022-01-27: 7.5 mg via INTRAVENOUS

## 2022-01-27 MED ORDER — KETOROLAC TROMETHAMINE 15 MG/ML IJ SOLN
7.5000 mg | Freq: Four times a day (QID) | INTRAMUSCULAR | Status: DC
  Administered 2022-01-27 – 2022-01-28 (×3): 7.5 mg via INTRAVENOUS
  Filled 2022-01-27 (×3): qty 1

## 2022-01-27 MED ORDER — KETOROLAC TROMETHAMINE 30 MG/ML IJ SOLN
INTRAMUSCULAR | Status: AC
  Filled 2022-01-27: qty 1

## 2022-01-27 MED ORDER — CEFAZOLIN SODIUM-DEXTROSE 2-4 GM/100ML-% IV SOLN
2.0000 g | Freq: Three times a day (TID) | INTRAVENOUS | Status: AC
  Administered 2022-01-27 – 2022-01-28 (×2): 2 g via INTRAVENOUS
  Filled 2022-01-27 (×2): qty 100

## 2022-01-27 MED ORDER — ROSUVASTATIN CALCIUM 5 MG PO TABS
10.0000 mg | ORAL_TABLET | Freq: Every day | ORAL | Status: DC
  Administered 2022-01-27: 10 mg via ORAL
  Filled 2022-01-27: qty 2

## 2022-01-27 MED ORDER — OXYCODONE HCL 5 MG PO TABS: 5.0000 mg | ORAL_TABLET | Freq: Once | ORAL | Status: DC | PRN

## 2022-01-27 MED ORDER — INSULIN ASPART 100 UNIT/ML IJ SOLN
0.0000 [IU] | Freq: Every day | INTRAMUSCULAR | Status: DC
  Administered 2022-01-27: 3 [IU] via SUBCUTANEOUS

## 2022-01-27 MED ORDER — PREGABALIN 25 MG PO CAPS: 50.0000 mg | ORAL_CAPSULE | Freq: Two times a day (BID) | ORAL | Status: DC

## 2022-01-27 MED ORDER — INSULIN ASPART 100 UNIT/ML IJ SOLN: 0.0000 [IU] | INTRAMUSCULAR | Status: DC | PRN

## 2022-01-27 MED ORDER — ACETAMINOPHEN 325 MG PO TABS: 650.0000 mg | ORAL_TABLET | ORAL | Status: DC | PRN

## 2022-01-27 MED ORDER — LIDOCAINE 2% (20 MG/ML) 5 ML SYRINGE
INTRAMUSCULAR | Status: DC | PRN
  Administered 2022-01-27: 20 mg via INTRAVENOUS

## 2022-01-27 MED ORDER — CHLORHEXIDINE GLUCONATE CLOTH 2 % EX PADS: 6.0000 | MEDICATED_PAD | Freq: Once | CUTANEOUS | Status: DC

## 2022-01-27 MED ORDER — SPIRONOLACTONE 25 MG PO TABS
25.0000 mg | ORAL_TABLET | Freq: Every day | ORAL | Status: DC
Start: 2022-01-27 — End: 2022-01-28
  Administered 2022-01-27: 25 mg via ORAL
  Filled 2022-01-27: qty 1

## 2022-01-27 MED ORDER — SALINE SPRAY 0.65 % NA SOLN: 1.0000 | NASAL | Status: DC | PRN

## 2022-01-27 MED ORDER — HYDROMORPHONE HCL 1 MG/ML IJ SOLN: 1.0000 mg | INTRAMUSCULAR | Status: DC | PRN

## 2022-01-27 MED ORDER — BUPIVACAINE HCL (PF) 0.25 % IJ SOLN
INTRAMUSCULAR | Status: AC
  Filled 2022-01-27: qty 30

## 2022-01-27 MED ORDER — ORAL CARE MOUTH RINSE: 15.0000 mL | Freq: Once | OROMUCOSAL | Status: AC

## 2022-01-27 MED ORDER — EPHEDRINE 5 MG/ML INJ
INTRAVENOUS | Status: AC
  Filled 2022-01-27: qty 5

## 2022-01-27 MED ORDER — PANTOPRAZOLE SODIUM 40 MG PO TBEC: 40.0000 mg | DELAYED_RELEASE_TABLET | Freq: Every day | ORAL | Status: DC | PRN

## 2022-01-27 MED ORDER — MELATONIN 3 MG PO TABS
3.0000 mg | ORAL_TABLET | Freq: Every evening | ORAL | Status: DC | PRN
  Administered 2022-01-27: 3 mg via ORAL
  Filled 2022-01-27 (×2): qty 1

## 2022-01-27 MED ORDER — METFORMIN HCL 500 MG PO TABS: 1000.0000 mg | ORAL_TABLET | Freq: Every day | ORAL | Status: DC

## 2022-01-27 MED ORDER — ROCURONIUM BROMIDE 10 MG/ML (PF) SYRINGE
PREFILLED_SYRINGE | INTRAVENOUS | Status: AC
  Filled 2022-01-27: qty 10

## 2022-01-27 MED ORDER — ASPIRIN 81 MG PO TBEC
81.0000 mg | DELAYED_RELEASE_TABLET | Freq: Every day | ORAL | Status: DC
  Administered 2022-01-27 – 2022-01-28 (×2): 81 mg via ORAL
  Filled 2022-01-27 (×2): qty 1

## 2022-01-27 MED ORDER — LACTATED RINGERS IV SOLN: INTRAVENOUS | Status: DC

## 2022-01-27 MED ORDER — PHENOL 1.4 % MT LIQD: 1.0000 | OROMUCOSAL | Status: DC | PRN

## 2022-01-27 MED ORDER — DEXAMETHASONE SODIUM PHOSPHATE 10 MG/ML IJ SOLN
INTRAMUSCULAR | Status: DC | PRN
  Administered 2022-01-27: 5 mg via INTRAVENOUS

## 2022-01-27 MED ORDER — HYDROMORPHONE HCL 1 MG/ML IJ SOLN: 0.2500 mg | INTRAMUSCULAR | Status: DC | PRN

## 2022-01-27 MED ORDER — PROPOFOL 10 MG/ML IV BOLUS
INTRAVENOUS | Status: AC
  Filled 2022-01-27: qty 20

## 2022-01-27 MED ORDER — ROCURONIUM BROMIDE 10 MG/ML (PF) SYRINGE
PREFILLED_SYRINGE | INTRAVENOUS | Status: DC | PRN
  Administered 2022-01-27: 15 mg via INTRAVENOUS
  Administered 2022-01-27: 60 mg via INTRAVENOUS

## 2022-01-27 MED ORDER — INSULIN ASPART 100 UNIT/ML IJ SOLN
0.0000 [IU] | Freq: Three times a day (TID) | INTRAMUSCULAR | Status: DC
  Administered 2022-01-28: 3 [IU] via SUBCUTANEOUS

## 2022-01-27 MED ORDER — ATENOLOL 25 MG PO TABS: 25.0000 mg | ORAL_TABLET | Freq: Every morning | ORAL | Status: DC

## 2022-01-27 MED ORDER — THROMBIN 5000 UNITS EX SOLR
CUTANEOUS | Status: AC
  Filled 2022-01-27: qty 10000

## 2022-01-27 MED ORDER — ONDANSETRON HCL 4 MG PO TABS: 4.0000 mg | ORAL_TABLET | Freq: Four times a day (QID) | ORAL | Status: DC | PRN

## 2022-01-27 MED ORDER — MEPERIDINE HCL 25 MG/ML IJ SOLN: 6.2500 mg | INTRAMUSCULAR | Status: DC | PRN

## 2022-01-27 MED ORDER — FENTANYL CITRATE (PF) 250 MCG/5ML IJ SOLN
INTRAMUSCULAR | Status: DC | PRN
  Administered 2022-01-27 (×2): 50 ug via INTRAVENOUS
  Administered 2022-01-27: 150 ug via INTRAVENOUS

## 2022-01-27 MED ORDER — TIMOLOL MALEATE 0.5 % OP SOLN
1.0000 [drp] | Freq: Every morning | OPHTHALMIC | Status: DC
  Filled 2022-01-27: qty 5

## 2022-01-27 MED ORDER — AMLODIPINE BESYLATE 5 MG PO TABS
10.0000 mg | ORAL_TABLET | Freq: Every morning | ORAL | Status: DC
  Administered 2022-01-28: 10 mg via ORAL
  Filled 2022-01-27: qty 2

## 2022-01-27 MED ORDER — HYDROCODONE-ACETAMINOPHEN 5-325 MG PO TABS
1.0000 | ORAL_TABLET | ORAL | Status: DC | PRN
  Administered 2022-01-27 (×2): 1 via ORAL
  Filled 2022-01-27 (×2): qty 1

## 2022-01-27 MED ORDER — ONDANSETRON HCL 4 MG/2ML IJ SOLN
INTRAMUSCULAR | Status: DC | PRN
  Administered 2022-01-27: 4 mg via INTRAVENOUS

## 2022-01-27 MED ORDER — ONDANSETRON HCL 4 MG/2ML IJ SOLN
INTRAMUSCULAR | Status: AC
Start: 2022-01-27 — End: ?
  Filled 2022-01-27: qty 2

## 2022-01-27 MED ORDER — KETOROLAC TROMETHAMINE 30 MG/ML IJ SOLN
INTRAMUSCULAR | Status: DC | PRN
  Administered 2022-01-27: 15 mg via INTRAVENOUS

## 2022-01-27 MED ORDER — MENTHOL 3 MG MT LOZG: 1.0000 | LOZENGE | OROMUCOSAL | Status: DC | PRN

## 2022-01-27 MED ORDER — CEFAZOLIN SODIUM-DEXTROSE 2-4 GM/100ML-% IV SOLN
2.0000 g | INTRAVENOUS | Status: AC
  Administered 2022-01-27: 2 g via INTRAVENOUS
  Filled 2022-01-27: qty 100

## 2022-01-27 MED ORDER — ACETAMINOPHEN 500 MG PO TABS
1000.0000 mg | ORAL_TABLET | Freq: Once | ORAL | Status: AC
  Administered 2022-01-27: 1000 mg via ORAL
  Filled 2022-01-27: qty 2

## 2022-01-27 MED ORDER — CYCLOBENZAPRINE HCL 10 MG PO TABS
10.0000 mg | ORAL_TABLET | Freq: Three times a day (TID) | ORAL | Status: DC | PRN
  Administered 2022-01-27: 10 mg via ORAL
  Filled 2022-01-27: qty 1

## 2022-01-27 MED ORDER — 0.9 % SODIUM CHLORIDE (POUR BTL) OPTIME
TOPICAL | Status: DC | PRN
  Administered 2022-01-27: 1000 mL

## 2022-01-27 SURGICAL SUPPLY — 57 items
ADH SKN CLS APL DERMABOND .7 (GAUZE/BANDAGES/DRESSINGS) ×1
APL SKNCLS STERI-STRIP NONHPOA (GAUZE/BANDAGES/DRESSINGS) ×1
BAG COUNTER SPONGE SURGICOUNT (BAG) ×3 IMPLANT
BAG DECANTER FOR FLEXI CONT (MISCELLANEOUS) ×3 IMPLANT
BAG SPNG CNTER NS LX DISP (BAG) ×1
BAND INSRT 18 STRL LF DISP RB (MISCELLANEOUS) ×2
BAND RUBBER #18 3X1/16 STRL (MISCELLANEOUS) ×6 IMPLANT
BENZOIN TINCTURE PRP APPL 2/3 (GAUZE/BANDAGES/DRESSINGS) ×3 IMPLANT
BLADE CLIPPER SURG (BLADE) IMPLANT
BUR CUTTER 7.0 ROUND (BURR) ×3 IMPLANT
CANISTER SUCT 3000ML PPV (MISCELLANEOUS) ×3 IMPLANT
CARTRIDGE OIL MAESTRO DRILL (MISCELLANEOUS) ×2 IMPLANT
DERMABOND ADVANCED (GAUZE/BANDAGES/DRESSINGS) ×1
DERMABOND ADVANCED .7 DNX12 (GAUZE/BANDAGES/DRESSINGS) ×2 IMPLANT
DIFFUSER DRILL AIR PNEUMATIC (MISCELLANEOUS) ×3 IMPLANT
DRAPE HALF SHEET 40X57 (DRAPES) IMPLANT
DRAPE LAPAROTOMY 100X72X124 (DRAPES) ×3 IMPLANT
DRAPE MICROSCOPE LEICA (MISCELLANEOUS) ×3 IMPLANT
DRAPE SURG 17X23 STRL (DRAPES) ×6 IMPLANT
DRSG OPSITE POSTOP 4X6 (GAUZE/BANDAGES/DRESSINGS) ×1 IMPLANT
DURAPREP 26ML APPLICATOR (WOUND CARE) ×3 IMPLANT
ELECT REM PT RETURN 9FT ADLT (ELECTROSURGICAL) ×2
ELECTRODE REM PT RTRN 9FT ADLT (ELECTROSURGICAL) ×2 IMPLANT
GAUZE 4X4 16PLY ~~LOC~~+RFID DBL (SPONGE) IMPLANT
GAUZE SPONGE 4X4 12PLY STRL (GAUZE/BANDAGES/DRESSINGS) ×3 IMPLANT
GLOVE BIO SURGEON STRL SZ 6.5 (GLOVE) ×3 IMPLANT
GLOVE BIOGEL PI IND STRL 6.5 (GLOVE) ×2 IMPLANT
GLOVE BIOGEL PI IND STRL 7.5 (GLOVE) IMPLANT
GLOVE BIOGEL PI IND STRL 8 (GLOVE) IMPLANT
GLOVE BIOGEL PI INDICATOR 6.5 (GLOVE) ×1
GLOVE BIOGEL PI INDICATOR 7.5 (GLOVE) ×2
GLOVE BIOGEL PI INDICATOR 8 (GLOVE) ×1
GLOVE ECLIPSE 7.5 STRL STRAW (GLOVE) ×3 IMPLANT
GLOVE ECLIPSE 9.0 STRL (GLOVE) ×4 IMPLANT
GLOVE EXAM NITRILE XL STR (GLOVE) IMPLANT
GOWN STRL REUS W/ TWL LRG LVL3 (GOWN DISPOSABLE) IMPLANT
GOWN STRL REUS W/ TWL XL LVL3 (GOWN DISPOSABLE) ×2 IMPLANT
GOWN STRL REUS W/TWL 2XL LVL3 (GOWN DISPOSABLE) IMPLANT
GOWN STRL REUS W/TWL LRG LVL3 (GOWN DISPOSABLE) ×4
GOWN STRL REUS W/TWL XL LVL3 (GOWN DISPOSABLE) ×6
KIT BASIN OR (CUSTOM PROCEDURE TRAY) ×3 IMPLANT
KIT TURNOVER KIT B (KITS) ×3 IMPLANT
NDL SPNL 22GX3.5 QUINCKE BK (NEEDLE) IMPLANT
NEEDLE HYPO 22GX1.5 SAFETY (NEEDLE) ×3 IMPLANT
NEEDLE SPNL 22GX3.5 QUINCKE BK (NEEDLE) IMPLANT
NS IRRIG 1000ML POUR BTL (IV SOLUTION) ×3 IMPLANT
OIL CARTRIDGE MAESTRO DRILL (MISCELLANEOUS) ×2
PACK LAMINECTOMY NEURO (CUSTOM PROCEDURE TRAY) ×3 IMPLANT
PAD ARMBOARD 7.5X6 YLW CONV (MISCELLANEOUS) ×9 IMPLANT
SPIKE FLUID TRANSFER (MISCELLANEOUS) ×3 IMPLANT
SPONGE SURGIFOAM ABS GEL SZ50 (HEMOSTASIS) ×3 IMPLANT
STRIP CLOSURE SKIN 1/2X4 (GAUZE/BANDAGES/DRESSINGS) ×3 IMPLANT
SUT VIC AB 2-0 CT1 18 (SUTURE) ×3 IMPLANT
SUT VIC AB 3-0 SH 8-18 (SUTURE) ×3 IMPLANT
TOWEL GREEN STERILE (TOWEL DISPOSABLE) ×3 IMPLANT
TOWEL GREEN STERILE FF (TOWEL DISPOSABLE) ×3 IMPLANT
WATER STERILE IRR 1000ML POUR (IV SOLUTION) ×3 IMPLANT

## 2022-01-27 NOTE — Brief Op Note (Signed)
01/27/2022  12:45 PM  PATIENT:  Willeen Niece  81 y.o. male  PRE-OPERATIVE DIAGNOSIS:  Stenosis  POST-OPERATIVE DIAGNOSIS:  Stenosis  PROCEDURE:  Procedure(s) with comments: Laminectomy and Foraminotomy - Bilateral - Lumbar Three-Four/Lumbar Four-Five (Bilateral) - 3C  SURGEON:  Surgeon(s) and Role:    * Earnie Larsson, MD - Primary  PHYSICIAN ASSISTANT:   ASSISTANTSMearl Latin   ANESTHESIA:   general  EBL:  225 mL   BLOOD ADMINISTERED:none  DRAINS: none   LOCAL MEDICATIONS USED:  MARCAINE     SPECIMEN:  No Specimen  DISPOSITION OF SPECIMEN:  N/A  COUNTS:  YES  TOURNIQUET:  * No tourniquets in log *  DICTATION: .Dragon Dictation  PLAN OF CARE: Admit for overnight observation  PATIENT DISPOSITION:  PACU - hemodynamically stable.   Delay start of Pharmacological VTE agent (>24hrs) due to surgical blood loss or risk of bleeding: yes

## 2022-01-27 NOTE — Anesthesia Postprocedure Evaluation (Signed)
Anesthesia Post Note  Patient: [name]  Procedure(s) Performed: Laminectomy and Foraminotomy - Bilateral - Lumbar Three-Four/Lumbar Four-Five (Bilateral: Back)     Patient location during evaluation: PACU Anesthesia Type: General Level of consciousness: awake and alert, patient cooperative and oriented Pain management: pain level controlled Vital Signs Assessment: post-procedure vital signs reviewed and stable Respiratory status: spontaneous breathing, nonlabored ventilation and respiratory function stable Cardiovascular status: blood pressure returned to baseline and stable Postop Assessment: no apparent nausea or vomiting Anesthetic complications: no   No notable events documented.  Last Vitals:  Vitals:   01/27/22 1330 01/27/22 1359  BP: 134/66 (!) 145/67  Pulse: (!) 44 (!) 52  Resp: 14 18  Temp:    SpO2: 97% 100%    Last Pain:  Vitals:   01/27/22 1405  TempSrc:   PainSc: 0-No pain                 Mychal Decarlo,E. Marinna Blane

## 2022-01-27 NOTE — Op Note (Signed)
Date of procedure: 01/27/2022  Date of dictation: Same  Service: Neurosurgery  Preoperative diagnosis: Lumbar stenosis with neurogenic claudication  Postoperative diagnosis: Same  Procedure Name: Bilateral L3-4 and L4-5 decompressive laminotomies with foraminotomies  Surgeon:Dashel Goines A.Kiosha Buchan, M.D.  Asst. Surgeon: Reinaldo Meeker, NP  Anesthesia: General  Indication: 81 year old male with back and bilateral lower extremity pain numbness and weakness consistent with severe neurogenic claudication which is failed conservative management.  Work-up demonstrates evidence of marked multifactorial spinal stenosis at L3-4 and L4-5.  Patient presents now for decompressive surgery in hopes improving his symptoms.  Operative note: After induction of anesthesia, patient position prone on the Wilson frame and properly padded.  Lumbar region prepped draped sterilely.  Incision made overlying L4.  Dissection performed on the left initially x-ray taken of the L4-5 level confirmed.  Self-retaining retractor was placed.  Laminotomies on the left at L3-4 and L4-5 were then performed using high-speed drill and Kerrison rongeurs to remove the inferior two thirds of the lamina of L3 and L4 respectively the medial aspect of the L3-4 and L4-5 facet joints and the superior rim of the L4 and L5 lamina.  Ligament flavum elevated and resected.  Foraminotomies performed along the course exiting L3-L4 and L5 nerve roots on the left.  Dissection performed on the right.  Retractor replaced on the right.  Laminotomies and foraminotomies performed in a similar fashion on the right again without complication.  Wound was then irrigated.  Gelfoam was placed topically for hemostasis.  Wounds then closed in layers with Vicryl sutures.  Steri-Strips and sterile dressing were applied.  No apparent complications.  Patient tolerated the procedure well and he returns to the recovery room postop.

## 2022-01-27 NOTE — Anesthesia Procedure Notes (Addendum)
Procedure Name: Intubation Date/Time: 01/27/2022 9:57 AM  Performed by: Imagene Riches, CRNAPre-anesthesia Checklist: Patient identified, Emergency Drugs available, Suction available and Patient being monitored Patient Re-evaluated:Patient Re-evaluated prior to induction Oxygen Delivery Method: Circle System Utilized Preoxygenation: Pre-oxygenation with 100% oxygen Induction Type: IV induction Ventilation: Mask ventilation without difficulty Laryngoscope Size: Glidescope and 4 Grade View: Grade I Tube type: Oral Tube size: 7.5 mm Number of attempts: 1 Airway Equipment and Method: Stylet and Oral airway Placement Confirmation: ETT inserted through vocal cords under direct vision, positive ETCO2 and breath sounds checked- equal and bilateral Secured at: 22 cm Tube secured with: Tape Dental Injury: Teeth and Oropharynx as per pre-operative assessment

## 2022-01-27 NOTE — H&P (Signed)
[name] is an 81 y.o. male.   Chief Complaint: Back and leg pain HPI: 81 year old male with progressively worsening lumbar pain with radiation to the posterior aspect of both lower extremities.  Work-up demonstrates evidence of significant spinal stenosis at L3-4 and L4-5.  Patient remotely status post surgery at L2-3.  Patient has failed conservative management including injections.  He presents now for decompressive surgery at L3-4 and L4-5 in hopes of improving his symptoms.  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   Cancer ([place]) 2010   prostate   Deviated septum    chronic sinusitis, nasal turbinate hypertrophy   Diabetes mellitus without complication ([place])    on metformin   GERD (gastroesophageal reflux disease)    Hypertension    Pneumonia    Sleep apnea    wears CPAP   Wears glasses     Past Surgical History:  Procedure Laterality Date   back injection     to lower back   BACK SURGERY     lower back   EYE SURGERY Bilateral 2019   cataract   KNEE ARTHROSCOPY Right 03/27/2015   Procedure: RIGHT ARTHROSCOPY KNEE WITH DEBRIDEMENT, PARTIAL LATERAL MENISCECTOMY;  Surgeon: Gaynelle Arabian, MD;  Location: [facility];  Service: Orthopedics;  Laterality: Right;   NASAL SEPTOPLASTY W/ TURBINOPLASTY Bilateral 04/01/2016   Procedure: NASAL SEPTOPLASTY WITH BILATERAL INFERIOR  TURBINATE REDUCTION;  Surgeon: Jerrell Belfast, MD;  Location: [facility] OR;  Service: ENT;  Laterality: Bilateral;   PROSTATECTOMY  07/20/2008   SINUS ENDO WITH FUSION Right 04/01/2016   Procedure: RIGHT ENDOSCOPIC SINUS SURGERY,AND CONSISTING  OF ANTERIOR ETHMOIDECTOMY;  Surgeon: Jerrell Belfast, MD;  Location: [facility] OR;  Service: ENT;  Laterality: Right;   surgery for collapsed lung Left 35 to 40 years ago    Family History  Problem Relation Age of Onset   Heart attack Father    Heart attack  Sister    Social History:  reports that he quit smoking about 33 years ago. His smoking use included cigarettes. He has a 20.00 pack-year smoking history. He has never used smokeless tobacco. He reports that he does not drink alcohol and does not use drugs.  Allergies:  Allergies  Allergen Reactions   Atorvastatin     Other reaction(s): thrombocytopenia   Tizanidine Hcl     Other reaction(s): dizziness    Medications Prior to Admission  Medication Sig Dispense Refill   amLODipine (NORVASC) 10 MG tablet Take 10 mg by mouth every morning.      aspirin EC 81 MG tablet Take 81 mg by mouth daily. Swallow whole.     atenolol (TENORMIN) 50 MG tablet Take 25 mg by mouth every morning.      cetirizine (ZYRTEC) 10 MG tablet Take 10 mg by mouth daily as needed for allergies (as needed).     lisinopril (PRINIVIL,ZESTRIL) 20 MG tablet Take 20 mg by mouth every morning.      Melatonin 3 MG TABS Take 3 mg by mouth at bedtime as needed (sleep).     metFORMIN (GLUCOPHAGE) 1000 MG tablet Take 1,000 mg by mouth daily. Reported 01/21/21     NON FORMULARY Pt uses a cpap nightly     pantoprazole (PROTONIX) 40 MG tablet Take 40 mg by mouth daily as needed (heart burn/as needed).  6   rosuvastatin (  CRESTOR) 10 MG tablet Take 10 mg by mouth at bedtime.     sodium chloride (OCEAN) 0.65 % SOLN nasal spray Place 1 spray into both nostrils as needed for congestion.     spironolactone (ALDACTONE) 25 MG tablet Take 25 mg by mouth daily.     timolol (TIMOPTIC) 0.5 % ophthalmic solution Place 1 drop into both eyes every morning.     pregabalin (LYRICA) 50 MG capsule 1 tablet at night for one week, then 1 tablet in the morning and 1 tablet at night. (Patient not taking: Reported on 01/19/2022) 60 capsule 1    Results for orders placed or performed during the hospital encounter of 01/27/22 (from the past 48 hour(s))  Glucose, capillary     Status: Abnormal   Collection Time: 01/27/22  8:01 AM  Result Value Ref Range    Glucose-Capillary 175 (H) 70 - 99 mg/dL    Comment: Glucose reference range applies only to samples taken after fasting for at least 8 hours.   Comment 1 Notify RN    Comment 2 Document in Chart    No results found.  Pertinent items noted in HPI and remainder of comprehensive ROS otherwise negative.  Blood pressure 138/71, pulse (!) 53, temperature 98 F (36.7 C), temperature source Oral, resp. rate 18, height '5\' 11"'$  (1.803 m), weight 100.2 kg, SpO2 99 %.  Patient is awake and alert.  He is oriented and appropriate.  Speech is fluent.  Judgment insight are intact.  Cranial nerve function normal bilateral.  Motor examination reveals intact motor strength bilaterally sensory examination with decrease sensation pinprick and light touch distally in both lower extremities.  Reflexes are hypoactive but symmetric.  No evidence of long track signs.  Gait is slow but otherwise normal.  Posture is mildly flexed peer examination head ears eyes nose and throat is unremarked.  Chest and abdomen are benign.  Extremities are free of major deformity. Assessment/Plan Lumbar stenosis with neurogenic claudication.  Plan bilateral L3-4 and L4-5 decompressive laminotomies and foraminotomies.  Risks and benefits been explained.  Patient wishes to proceed.  Mallie Mussel A Kimm Sider 01/27/2022, 9:32 AM

## 2022-01-27 NOTE — Transfer of Care (Signed)
Immediate Anesthesia Transfer of Care Note  Patient: [name]  Procedure(s) Performed: Laminectomy and Foraminotomy - Bilateral - Lumbar Three-Four/Lumbar Four-Five (Bilateral: Back)  Patient Location: PACU  Anesthesia Type:General  Level of Consciousness: drowsy  Airway & Oxygen Therapy: Patient Spontanous Breathing and Patient connected to nasal cannula oxygen  Post-op Assessment: Report given to RN and Post -op Vital signs reviewed and stable  Post vital signs: Reviewed and stable  Last Vitals:  Vitals Value Taken Time  BP 137/78 01/27/22 1252  Temp    Pulse 54 01/27/22 1255  Resp 18 01/27/22 1255  SpO2 97 % 01/27/22 1255  Vitals shown include unvalidated device data.  Last Pain:  Vitals:   01/27/22 0815  TempSrc:   PainSc: 0-No pain         Complications: No notable events documented.

## 2022-01-28 ENCOUNTER — Encounter (HOSPITAL_COMMUNITY): Payer: Self-pay | Admitting: Neurosurgery

## 2022-01-28 DIAGNOSIS — Z8546 Personal history of malignant neoplasm of prostate: Secondary | ICD-10-CM | POA: Diagnosis not present

## 2022-01-28 DIAGNOSIS — Z7984 Long term (current) use of oral hypoglycemic drugs: Secondary | ICD-10-CM | POA: Diagnosis not present

## 2022-01-28 DIAGNOSIS — I10 Essential (primary) hypertension: Secondary | ICD-10-CM | POA: Diagnosis not present

## 2022-01-28 DIAGNOSIS — E119 Type 2 diabetes mellitus without complications: Secondary | ICD-10-CM | POA: Diagnosis not present

## 2022-01-28 DIAGNOSIS — Z8572 Personal history of non-Hodgkin lymphomas: Secondary | ICD-10-CM | POA: Diagnosis not present

## 2022-01-28 DIAGNOSIS — M48062 Spinal stenosis, lumbar region with neurogenic claudication: Secondary | ICD-10-CM | POA: Diagnosis not present

## 2022-01-28 LAB — GLUCOSE, CAPILLARY: Glucose-Capillary: 182 mg/dL — ABNORMAL HIGH (ref 70–99)

## 2022-01-28 MED ORDER — HYDROCODONE-ACETAMINOPHEN 5-325 MG PO TABS
1.0000 | ORAL_TABLET | ORAL | 0 refills | Status: DC | PRN
Start: 2022-01-28 — End: 2022-09-05

## 2022-01-28 MED FILL — Thrombin For Soln 5000 Unit: CUTANEOUS | Qty: 2 | Status: AC

## 2022-01-28 NOTE — Plan of Care (Signed)

## 2022-01-28 NOTE — Discharge Summary (Signed)
Physician Discharge Summary  Patient ID: GRADIE OHM MRN: [id number] DOB/AGE: 83-Mar-1942 83 y.o.  Admit date: 01/27/2022 Discharge date: 01/28/2022  Admission Diagnoses:  Discharge Diagnoses:  Principal Problem:   Lumbar stenosis with neurogenic claudication   Discharged Condition: good  Hospital Course: Patient admitted to the hospital where he underwent uncomplicated lumbar decompressive surgery.  Postoperatively doing well.  Preoperative back and lower extremity pain much improved.  Standing ambulating and voiding without difficulty.  Ready for discharge home.  Consults:   Significant Diagnostic Studies:   Treatments:   Discharge Exam: Blood pressure 121/63, pulse (!) 53, temperature 98.1 F (36.7 C), temperature source Oral, resp. rate 18, height '5\' 11"'$  (1.803 m), weight 100.2 kg, SpO2 97 %. Awake and alert.  Oriented and appropriate.  Speech is fluent.  Judgment and insight intact.  Motor and sensory function normal.  Wound clean and dry.  Chest and abdomen benign.  Disposition: Discharge disposition: 01-Home or Self Care        Allergies as of 01/28/2022       Reactions   Atorvastatin    Other reaction(s): thrombocytopenia   Tizanidine Hcl    Other reaction(s): dizziness        Medication List     TAKE these medications    amLODipine 10 MG tablet Commonly known as: NORVASC Take 10 mg by mouth every morning.   aspirin EC 81 MG tablet Take 81 mg by mouth daily. Swallow whole.   atenolol 50 MG tablet Commonly known as: TENORMIN Take 25 mg by mouth every morning.   cetirizine 10 MG tablet Commonly known as: ZYRTEC Take 10 mg by mouth daily as needed for allergies (as needed).   HYDROcodone-acetaminophen 5-325 MG tablet Commonly known as: NORCO/VICODIN Take 1 tablet by mouth every 4 (four) hours as needed for moderate pain ((score 4 to 6)).   lisinopril 20 MG tablet Commonly known as: ZESTRIL Take 20 mg by mouth every morning.    melatonin 3 MG Tabs tablet Take 3 mg by mouth at bedtime as needed (sleep).   metFORMIN 1000 MG tablet Commonly known as: GLUCOPHAGE Take 1,000 mg by mouth daily. Reported 01/21/21   NON FORMULARY Pt uses a cpap nightly   pantoprazole 40 MG tablet Commonly known as: PROTONIX Take 40 mg by mouth daily as needed (heart burn/as needed).   pregabalin 50 MG capsule Commonly known as: LYRICA 1 tablet at night for one week, then 1 tablet in the morning and 1 tablet at night.   rosuvastatin 10 MG tablet Commonly known as: CRESTOR Take 10 mg by mouth at bedtime.   sodium chloride 0.65 % Soln nasal spray Commonly known as: OCEAN Place 1 spray into both nostrils as needed for congestion.   spironolactone 25 MG tablet Commonly known as: ALDACTONE Take 25 mg by mouth daily.   timolol 0.5 % ophthalmic solution Commonly known as: TIMOPTIC Place 1 drop into both eyes every morning.         Signed: Cooper Render Jla Reynolds 01/28/2022, 9:10 AM

## 2022-01-28 NOTE — Evaluation (Signed)
Occupational Therapy Evaluation Patient Details Name: [name] MRN: [id number] DOB: 06/14/1941 Today's Date: 01/28/2022   History of Present Illness Pt is an 81 y/o M s/p bilateral L3-4 and L4-5 decompressive laminectomies with foraminectomies. PMH includes GERD, HTN, DM, and back surgery   Clinical Impression   Pt independent at baseline with ADLs, uses cane for functional mobility, lives with family who can assist at d/c. Pt currently needing min guard-min A for ADLs, min guard for bed mobility, and min guard for transfers with RW. Pt reporting feeling "woozy" with sitting upright and ambulation in room. Pt educated on precautions and compensatory strategies for ADLs. Pt verbalized understanding, able to adhere to precautions throughout session with min cuing. Pt presenting with impairments listed below, will follow acutely. Recommend d/c home with family assistance.     Recommendations for follow up therapy are one component of a multi-disciplinary discharge planning process, led by the attending physician.  Recommendations may be updated based on patient status, additional functional criteria and insurance authorization.   Follow Up Recommendations  No OT follow up    Assistance Recommended at Discharge Intermittent Supervision/Assistance  Patient can return home with the following A little help with walking and/or transfers;A little help with bathing/dressing/bathroom;Assistance with cooking/housework;Assist for transportation;Help with stairs or ramp for entrance    Functional Status Assessment  Patient has had a recent decline in their functional status and demonstrates the ability to make significant improvements in function in a reasonable and predictable amount of time.  Equipment Recommendations  None recommended by OT;Other (comment) (pt has all needed DME)    Recommendations for Other Services PT consult     Precautions / Restrictions Precautions Precautions:  Back Precaution Booklet Issued: Yes (comment) Precaution Comments: pt educated on 3/3 back precautions Required Braces or Orthoses: Other Brace Other Brace: no brace needed per MD Restrictions Weight Bearing Restrictions: No      Mobility Bed Mobility Overal bed mobility: Needs Assistance Bed Mobility: Sidelying to Sit, Sit to Sidelying   Sidelying to sit: Min guard     Sit to sidelying: Min guard General bed mobility comments: + use of bedrail    Transfers Overall transfer level: Needs assistance Equipment used: Rolling walker (2 wheels) Transfers: Sit to/from Stand Sit to Stand: Min guard                  Balance Overall balance assessment: Needs assistance Sitting-balance support: Feet supported Sitting balance-Leahy Scale: Good     Standing balance support: During functional activity, Reliant on assistive device for balance Standing balance-Leahy Scale: Fair Standing balance comment: reliant on external support/RW                           ADL either performed or assessed with clinical judgement   ADL Overall ADL's : Needs assistance/impaired Eating/Feeding: Modified independent   Grooming: Supervision/safety;Standing   Upper Body Bathing: Minimal assistance;Sitting   Lower Body Bathing: Sitting/lateral leans;Sit to/from stand;Minimal assistance   Upper Body Dressing : Minimal assistance;Standing;Sitting   Lower Body Dressing: Sit to/from stand;Sitting/lateral leans;Minimal assistance   Toilet Transfer: Min guard;Rolling walker (2 wheels);Ambulation;Regular Toilet   Toileting- Water quality scientist and Hygiene: Min guard;Sitting/lateral lean;Sit to/from stand       Functional mobility during ADLs: Min guard;Rolling walker (2 wheels)       Vision   Vision Assessment?: No apparent visual deficits     Perception     Praxis  Pertinent Vitals/Pain Pain Assessment Pain Assessment: Faces Pain Score: 5  Faces Pain Scale:  Hurts little more Pain Location: low back/incision Pain Descriptors / Indicators: Discomfort Pain Intervention(s): Limited activity within patient's tolerance, Monitored during session, Repositioned     Hand Dominance     Extremity/Trunk Assessment Upper Extremity Assessment Upper Extremity Assessment: Overall WFL for tasks assessed   Lower Extremity Assessment Lower Extremity Assessment: Defer to PT evaluation   Cervical / Trunk Assessment Cervical / Trunk Assessment: Normal   Communication Communication Communication: No difficulties   Cognition Arousal/Alertness: Awake/alert Behavior During Therapy: WFL for tasks assessed/performed Overall Cognitive Status: Within Functional Limits for tasks assessed                                       General Comments  VSS On RA; pt reporting "wooziness" with sitting up and ambulation in room    Exercises     Shoulder Instructions      Home Living Family/patient expects to be discharged to:: Private residence Living Arrangements: Spouse/significant other Available Help at Discharge: Family Type of Home: House Home Access: Stairs to enter Technical brewer of Steps: 2   Home Layout: Two level;Able to live on main level with bedroom/bathroom     Bathroom Shower/Tub: Occupational psychologist: Standard     Home Equipment: Cane - single point;Crutches;Shower seat          Prior Functioning/Environment Prior Level of Function : Independent/Modified Independent             Mobility Comments: uses cane ADLs Comments: ind with ADLs        OT Problem List: Decreased strength;Decreased range of motion;Decreased activity tolerance;Impaired balance (sitting and/or standing);Decreased knowledge of precautions;Decreased knowledge of use of DME or AE;Decreased safety awareness      OT Treatment/Interventions: Self-care/ADL training;Therapeutic exercise;DME and/or AE instruction;Energy  conservation;Therapeutic activities;Patient/family education;Balance training    OT Goals(Current goals can be found in the care plan section) Acute Rehab OT Goals Patient Stated Goal: none stated OT Goal Formulation: With patient Time For Goal Achievement: 02/11/22 Potential to Achieve Goals: Good  OT Frequency: Min 2X/week    Co-evaluation              AM-PAC OT "6 Clicks" Daily Activity     Outcome Measure Help from another person eating meals?: None Help from another person taking care of personal grooming?: None Help from another person toileting, which includes using toliet, bedpan, or urinal?: A Little Help from another person bathing (including washing, rinsing, drying)?: A Little Help from another person to put on and taking off regular upper body clothing?: A Little Help from another person to put on and taking off regular lower body clothing?: A Little 6 Click Score: 20   End of Session Equipment Utilized During Treatment: Gait belt;Rolling walker (2 wheels) Nurse Communication: Mobility status  Activity Tolerance: Patient tolerated treatment well Patient left: in bed;with call bell/phone within reach  OT Visit Diagnosis: Unsteadiness on feet (R26.81);Other abnormalities of gait and mobility (R26.89);Muscle weakness (generalized) (M62.81)                Time: 2778-2423 OT Time Calculation (min): 36 min Charges:  OT General Charges $OT Visit: 1 Visit OT Evaluation $OT Eval Low Complexity: 1 Low OT Treatments $Self Care/Home Management : 8-22 mins  Teniola Tseng, OTD, OTR/L Acute Rehab [phone]  Kaylyn Lim 01/28/2022, 9:17 AM

## 2022-01-28 NOTE — Discharge Instructions (Signed)

## 2022-01-28 NOTE — Evaluation (Signed)
Physical Therapy Evaluation  Patient Details Name: [name] MRN: [id number] DOB: [birth year] Today's Date: 01/28/2022  History of Present Illness  Pt is an 81 y/o M s/p bilateral L3-5 decompressive laminectomies with foraminectomies on 01/27/2022. PMH includes GERD, HTN, DM, and back surgery  Clinical Impression  Pt admitted with above diagnosis. At the time of PT eval, pt was able to demonstrate transfers and ambulation with gross min guard assist and RW for support. Pt was educated on precautions, positioning recommendations, appropriate activity progression, and car transfer. Pt currently with functional limitations due to the deficits listed below (see PT Problem List). Pt will benefit from skilled PT to increase their independence and safety with mobility to allow discharge to the venue listed below.         Recommendations for follow up therapy are one component of a multi-disciplinary discharge planning process, led by the attending physician.  Recommendations may be updated based on patient status, additional functional criteria and insurance authorization.  Follow Up Recommendations No PT follow up      Assistance Recommended at Discharge PRN  Patient can return home with the following  A little help with walking and/or transfers;A little help with bathing/dressing/bathroom;Assistance with cooking/housework;Assist for transportation;Help with stairs or ramp for entrance    Equipment Recommendations None recommended by PT  Recommendations for Other Services       Functional Status Assessment Patient has had a recent decline in their functional status and demonstrates the ability to make significant improvements in function in a reasonable and predictable amount of time.     Precautions / Restrictions Precautions Precautions: Back Precaution Booklet Issued: Yes (comment) Precaution Comments: Reviewed handout and pt was cued on precautions during functional  mobility. Required Braces or Orthoses: Other Brace Other Brace: no brace needed order Restrictions Weight Bearing Restrictions: No      Mobility  Bed Mobility Overal bed mobility: Modified Independent Bed Mobility: Sidelying to Sit, Rolling           General bed mobility comments: HOB flat and rails lowered to simulate home environment.    Transfers Overall transfer level: Needs assistance Equipment used: Rolling walker (2 wheels) Transfers: Sit to/from Stand Sit to Stand: Min guard           General transfer comment: Close guard for safety as pt powered up to full stand. No assist required.    Ambulation/Gait Ambulation/Gait assistance: Min guard Gait Distance (Feet): 300 Feet Assistive device: Rolling walker (2 wheels) Gait Pattern/deviations: Step-through pattern, Decreased stride length, Trunk flexed Gait velocity: Decreased Gait velocity interpretation: <1.31 ft/sec, indicative of household ambulator   General Gait Details: VC's for improved posture, closer walker proximity, and forward gaze. No assist required however close guard provided for safety.  Stairs Stairs: Yes Stairs assistance: Min guard Stair Management: One rail Right, Step to pattern, Forwards Number of Stairs: 3 General stair comments: VC"s for sequencing and general safety. No knee buckling noted.  Wheelchair Mobility    Modified Rankin (Stroke Patients Only)       Balance Overall balance assessment: Needs assistance Sitting-balance support: Feet supported Sitting balance-Leahy Scale: Good     Standing balance support: During functional activity, Reliant on assistive device for balance Standing balance-Leahy Scale: Fair Standing balance comment: reliant on external support/RW                             Pertinent Vitals/Pain Pain Assessment Pain Assessment: Faces  Faces Pain Scale: Hurts little more Pain Location: low back/incision Pain Descriptors / Indicators:  Discomfort Pain Intervention(s): Limited activity within patient's tolerance, Monitored during session, Repositioned    Home Living Family/patient expects to be discharged to:: Private residence Living Arrangements: Spouse/significant other Available Help at Discharge: Family Type of Home: House Home Access: Stairs to enter   Technical brewer of Steps: 2   Home Layout: Two level;Able to live on main level with bedroom/bathroom Home Equipment: Cane - single point;Crutches;Shower seat      Prior Function Prior Level of Function : Independent/Modified Independent             Mobility Comments: uses cane ADLs Comments: ind with ADLs     Hand Dominance        Extremity/Trunk Assessment   Upper Extremity Assessment Upper Extremity Assessment: Overall WFL for tasks assessed    Lower Extremity Assessment Lower Extremity Assessment: Generalized weakness (Mild; consistent with pre-op diagnosis)    Cervical / Trunk Assessment Cervical / Trunk Assessment: Back Surgery  Communication   Communication: No difficulties  Cognition Arousal/Alertness: Awake/alert Behavior During Therapy: WFL for tasks assessed/performed Overall Cognitive Status: Within Functional Limits for tasks assessed                                          General Comments General comments (skin integrity, edema, etc.): VSS On RA; pt reporting "wooziness" with sitting up and ambulation in room    Exercises     Assessment/Plan    PT Assessment Patient needs continued PT services  PT Problem List Decreased strength;Decreased activity tolerance;Decreased balance;Decreased mobility;Decreased safety awareness;Decreased knowledge of use of DME;Pain       PT Treatment Interventions DME instruction;Gait training;Stair training;Functional mobility training;Therapeutic activities;Therapeutic exercise;Balance training;Patient/family education    PT Goals (Current goals can be found in  the Care Plan section)  Acute Rehab PT Goals Patient Stated Goal: Home today PT Goal Formulation: With patient/family Time For Goal Achievement: 02/04/22 Potential to Achieve Goals: Good    Frequency Min 5X/week     Co-evaluation               AM-PAC PT "6 Clicks" Mobility  Outcome Measure Help needed turning from your back to your side while in a flat bed without using bedrails?: None Help needed moving from lying on your back to sitting on the side of a flat bed without using bedrails?: None Help needed moving to and from a bed to a chair (including a wheelchair)?: A Little Help needed standing up from a chair using your arms (e.g., wheelchair or bedside chair)?: A Little Help needed to walk in hospital room?: A Little Help needed climbing 3-5 steps with a railing? : A Little 6 Click Score: 20    End of Session Equipment Utilized During Treatment: Gait belt Activity Tolerance: Patient tolerated treatment well Patient left: in bed;with call bell/phone within reach;with family/visitor present Nurse Communication: Mobility status PT Visit Diagnosis: Unsteadiness on feet (R26.81);Pain Pain - part of body:  (back)    Time: 4782-9562 PT Time Calculation (min) (ACUTE ONLY): 21 min   Charges:              Rolinda Roan, PT, DPT Acute Rehabilitation Services Secure Chat Preferred Office: [phone]   Thelma Comp 01/28/2022, 10:24 AM

## 2022-01-28 NOTE — Progress Notes (Signed)
Patient awaiting transport via wheelchair by volunteer for discharge home; in no acute distress nor complaints of pain nor discomfort; moves all extremities well; incision on his back with honeycomb dressing and is clean, dry and intact;  room was checked for all his belongings; discharge instructions concerning his medications, incision care, follow up appointment and when to call the doctor as needed were all discussed with patient and his family by RN and all expressed understanding on the instructions given.

## 2022-02-12 DIAGNOSIS — N3001 Acute cystitis with hematuria: Secondary | ICD-10-CM | POA: Diagnosis not present

## 2022-02-17 DIAGNOSIS — L821 Other seborrheic keratosis: Secondary | ICD-10-CM | POA: Diagnosis not present

## 2022-02-17 DIAGNOSIS — D2271 Melanocytic nevi of right lower limb, including hip: Secondary | ICD-10-CM | POA: Diagnosis not present

## 2022-02-17 DIAGNOSIS — D1801 Hemangioma of skin and subcutaneous tissue: Secondary | ICD-10-CM | POA: Diagnosis not present

## 2022-02-17 DIAGNOSIS — L720 Epidermal cyst: Secondary | ICD-10-CM | POA: Diagnosis not present

## 2022-03-03 DIAGNOSIS — I129 Hypertensive chronic kidney disease with stage 1 through stage 4 chronic kidney disease, or unspecified chronic kidney disease: Secondary | ICD-10-CM | POA: Diagnosis not present

## 2022-03-03 DIAGNOSIS — N1831 Chronic kidney disease, stage 3a: Secondary | ICD-10-CM | POA: Diagnosis not present

## 2022-03-03 DIAGNOSIS — R300 Dysuria: Secondary | ICD-10-CM | POA: Diagnosis not present

## 2022-03-03 DIAGNOSIS — E1169 Type 2 diabetes mellitus with other specified complication: Secondary | ICD-10-CM | POA: Diagnosis not present

## 2022-03-11 DIAGNOSIS — R29898 Other symptoms and signs involving the musculoskeletal system: Secondary | ICD-10-CM | POA: Diagnosis not present

## 2022-03-11 DIAGNOSIS — N3941 Urge incontinence: Secondary | ICD-10-CM | POA: Diagnosis not present

## 2022-03-11 DIAGNOSIS — R251 Tremor, unspecified: Secondary | ICD-10-CM | POA: Diagnosis not present

## 2022-03-17 ENCOUNTER — Telehealth: Payer: Self-pay | Admitting: Neurology

## 2022-03-17 ENCOUNTER — Ambulatory Visit (INDEPENDENT_AMBULATORY_CARE_PROVIDER_SITE_OTHER): Payer: Medicare Other | Admitting: Neurology

## 2022-03-17 VITALS — BP 161/56 | HR 73 | Ht 71.0 in | Wt 213.0 lb

## 2022-03-17 DIAGNOSIS — R269 Unspecified abnormalities of gait and mobility: Secondary | ICD-10-CM

## 2022-03-17 DIAGNOSIS — R413 Other amnesia: Secondary | ICD-10-CM | POA: Diagnosis not present

## 2022-03-17 MED ORDER — DULOXETINE HCL 30 MG PO CPEP: 30.0000 mg | ORAL_CAPSULE | Freq: Every day | ORAL | 11 refills | Status: DC

## 2022-03-17 NOTE — Telephone Encounter (Signed)
Medicare/mutual of [place] order sent to [facility], [facility] they will reach out to the patient to schedule.

## 2022-03-17 NOTE — Progress Notes (Signed)
Chief Complaint  Patient presents with   Follow-up    Room 13, with wife balance issues and leg weakness and pain, states sx started 1 week ago       [name]  [name] is a 81 y.o. male   Status post lumbar decompression surgery July 2023 Gait abnormality Cognitive impairment  MoCA examination 21/30,  MRI of the brain  Cymbalta 30 mg daily   DIAGNOSTIC DATA (LABS, IMAGING, TESTING) - I reviewed patient records, labs, notes, testing and imaging myself where available.  Laboratory evaluation February 2023, triglyceride 124, LDL 73 A1c 7.2, normal CMP, CBC with hemoglobin of 13.0, A1c 7.2,  MEDICAL HISTORY:  [name], is a 81 year old male seen in request by his primary care physician Dr. Felipa Eth, Christiane Ha, for evaluation of hand tremor, he is accompanied by his wife at today's visit on November 05, 2021  I reviewed and summarized the referring note. PMHX. 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  He has long history of chronic low back pain, has lumbar decompression surgery in the past, which did help his symptoms some,  He began to noticed worsening low back pain since January 2023, radiating down to bilateral buttock, and posterior thigh, getting worse with change positions such as getting up and down  He has been under pain management Dr. Brien Few over the past few months, had injection to lower lumbar region, will is only good for 3 weeks  Personally reviewed MRI of lumbar without contrast November 2022, multilevel degenerative changes, significant spinal stenosis L3-4, variable degree of foraminal narrowing,  He has started physical therapy recently, which seems to help him some, he denies bowel and bladder incontinence denied persistent lower extremity muscle weakness  UPDATE March 17 2922: He is accompanied by his wife at today's clinical  visit, he underwent bilateral L3-4, L4-5 decompressive laminotomy with foraminotomy by Dr. Deri Fuelling January 27, 2022  This has helped his low back pain, while in the rehab he was doing very well, able to ambulate much better  But since discharge, wife reported that he continued to regress, tends to sit, feels frustrated easily, rely on his walker sometimes, bending over, he has not started home physical therapy yet  He describes low back pain with positional change, such as getting up or sitting down otherwise he denies significant low back pain, denies significant bowel and bladder incontinence  Wife noted he has mild memory trouble, tends to be irritable, today's MoCA examination 21/30,  PHYSICAL EXAM:   Vitals:   03/17/22 1012  BP: (!) 161/56  Pulse: 73  Weight: 213 lb (96.6 kg)  Height: '5\' 11"'$  (1.803 m)   Not recorded     Body mass index is 29.71 kg/m.  PHYSICAL EXAMNIATION:  Gen: NAD, conversant, well nourised, well groomed                     Cardiovascular: Regular rate rhythm, no peripheral edema, warm, nontender. Eyes: Conjunctivae clear without exudates or hemorrhage Neck: Supple, no carotid bruits. Pulmonary: Clear to auscultation bilaterally   NEUROLOGICAL EXAM:  MENTAL STATUS: Speech/cognition Awake, alert, oriented to history taking care of conversation    03/17/2022   11:00 AM  Montreal Cognitive Assessment   Visuospatial/ Executive (0/5) 3  Naming (0/3) 3  Attention: Read list of digits (0/2) 2  Attention: Read list of letters (0/1)  0  Attention: Serial 7 subtraction starting at 100 (0/3) 2  Language: Repeat phrase (0/2) 2  Language : Fluency (0/1) 1  Abstraction (0/2) 2  Delayed Recall (0/5) 0  Orientation (0/6) 6  Total 21    CRANIAL NERVES: CN II: Visual fields are full to confrontation. Pupils are round equal and briskly reactive to light. CN III, IV, VI: extraocular movement are normal. No ptosis. CN V: Facial sensation is intact to light  touch CN VII: Face is symmetric with normal eye closure  CN VIII: Hearing is normal to causal conversation. CN IX, X: Phonation is normal. CN XI: Head turning and shoulder shrug are intact  MOTOR: Mild bilateral hip flexion weakness due to low back pain  REFLEXES: Reflexes are 1 and symmetric at the biceps, triceps, knees, and ankles. Plantar responses are flexor.  SENSORY: Intact to light touch, pinprick and vibratory sensation are intact in fingers and toes.  COORDINATION: There is no trunk or limb dysmetria noted.  GAIT/STANCE: Need push-up, multiple effort to get up from seated position, no wide-based, cautious  REVIEW OF SYSTEMS:  Full 14 system review of systems performed and notable only for as above All other review of systems were negative.   ALLERGIES: Allergies  Allergen Reactions   Atorvastatin     Other reaction(s): thrombocytopenia   Tizanidine Hcl     Other reaction(s): dizziness    HOME MEDICATIONS: Current Outpatient Medications  Medication Sig Dispense Refill   amLODipine (NORVASC) 10 MG tablet Take 10 mg by mouth every morning.      aspirin EC 81 MG tablet Take 81 mg by mouth daily. Swallow whole.     atenolol (TENORMIN) 50 MG tablet Take 25 mg by mouth every morning.      cetirizine (ZYRTEC) 10 MG tablet Take 10 mg by mouth daily as needed for allergies (as needed).     HYDROcodone-acetaminophen (NORCO/VICODIN) 5-325 MG tablet Take 1 tablet by mouth every 4 (four) hours as needed for moderate pain ((score 4 to 6)). 30 tablet 0   lisinopril (PRINIVIL,ZESTRIL) 20 MG tablet Take 20 mg by mouth every morning.      Melatonin 3 MG TABS Take 3 mg by mouth at bedtime as needed (sleep).     metFORMIN (GLUCOPHAGE) 1000 MG tablet Take 1,000 mg by mouth daily. Reported 01/21/21     NON FORMULARY Pt uses a cpap nightly     pantoprazole (PROTONIX) 40 MG tablet Take 40 mg by mouth daily as needed (heart burn/as needed).  6   pregabalin (LYRICA) 50 MG capsule 1 tablet  at night for one week, then 1 tablet in the morning and 1 tablet at night. 60 capsule 1   rosuvastatin (CRESTOR) 10 MG tablet Take 10 mg by mouth at bedtime.     sodium chloride (OCEAN) 0.65 % SOLN nasal spray Place 1 spray into both nostrils as needed for congestion.     spironolactone (ALDACTONE) 25 MG tablet Take 25 mg by mouth daily.     timolol (TIMOPTIC) 0.5 % ophthalmic solution Place 1 drop into both eyes every morning.     No current facility-administered medications for this visit.    PAST MEDICAL HISTORY: Past Medical History:  Diagnosis Date   Acute meniscal tear of right knee    Arthritis    Cancer ([place]) 2015   lymphoma new dx area removed behind right ear   Cancer ([place]) 2010   prostate   Deviated septum    chronic sinusitis,  nasal turbinate hypertrophy   Diabetes mellitus without complication (HCC)    on metformin   GERD (gastroesophageal reflux disease)    Hypertension    Pneumonia    Sleep apnea    wears CPAP   Wears glasses     PAST SURGICAL HISTORY: Past Surgical History:  Procedure Laterality Date   back injection     to lower back   BACK SURGERY     lower back   EYE SURGERY Bilateral 2019   cataract   KNEE ARTHROSCOPY Right 03/27/2015   Procedure: RIGHT ARTHROSCOPY KNEE WITH DEBRIDEMENT, PARTIAL LATERAL MENISCECTOMY;  Surgeon: Gaynelle Arabian, MD;  Location: [facility];  Service: Orthopedics;  Laterality: Right;   LUMBAR LAMINECTOMY/DECOMPRESSION MICRODISCECTOMY Bilateral 01/27/2022   Procedure: Laminectomy and Foraminotomy - Bilateral - Lumbar Three-Four/Lumbar Four-Five;  Surgeon: Earnie Larsson, MD;  Location: [facility];  Service: Neurosurgery;  Laterality: Bilateral;  3C   NASAL SEPTOPLASTY W/ TURBINOPLASTY Bilateral 04/01/2016   Procedure: NASAL SEPTOPLASTY WITH BILATERAL INFERIOR  TURBINATE REDUCTION;  Surgeon: Jerrell Belfast, MD;  Location: [facility];  Service: ENT;  Laterality: Bilateral;   PROSTATECTOMY  07/20/2008   SINUS ENDO WITH FUSION Right 04/01/2016    Procedure: RIGHT ENDOSCOPIC SINUS SURGERY,AND CONSISTING  OF ANTERIOR ETHMOIDECTOMY;  Surgeon: Jerrell Belfast, MD;  Location: [facility];  Service: ENT;  Laterality: Right;   surgery for collapsed lung Left 35 to 40 years ago    FAMILY HISTORY: Family History  Problem Relation Age of Onset   Heart attack Father    Heart attack Sister     SOCIAL HISTORY: Social History   Socioeconomic History   Marital status: Married    Spouse name: Not on file   Number of children: Not on file   Years of education: Not on file   Highest education level: Not on file  Occupational History   Not on file  Tobacco Use   Smoking status: Former    Packs/day: 1.00    Years: 20.00    Total pack years: 20.00    Types: Cigarettes    Quit date: 78    Years since quitting: 33.6   Smokeless tobacco: Never  Substance and Sexual Activity   Alcohol use: No   Drug use: No   Sexual activity: Not on file  Other Topics Concern   Not on file  Social History Narrative   Not on file   Social Determinants of Health   Financial Resource Strain: Not on file  Food Insecurity: Not on file  Transportation Needs: Not on file  Physical Activity: Not on file  Stress: Not on file  Social Connections: Not on file  Intimate Partner Violence: Not on file      Marcial Pacas, M.D. Ph.D.  [facility] [address], [place], [place] [address] Ph: [phone] Fax: [phone]  CC:  Lajean Manes, [address] [place] [place],  [place] [address]  Lajean Manes, MD

## 2022-03-19 DIAGNOSIS — M5451 Vertebrogenic low back pain: Secondary | ICD-10-CM | POA: Diagnosis not present

## 2022-03-19 DIAGNOSIS — R2689 Other abnormalities of gait and mobility: Secondary | ICD-10-CM | POA: Diagnosis not present

## 2022-03-20 ENCOUNTER — Encounter: Payer: Self-pay | Admitting: Neurology

## 2022-03-24 ENCOUNTER — Other Ambulatory Visit: Payer: Medicare Other

## 2022-03-25 DIAGNOSIS — R2689 Other abnormalities of gait and mobility: Secondary | ICD-10-CM | POA: Diagnosis not present

## 2022-03-25 DIAGNOSIS — M5451 Vertebrogenic low back pain: Secondary | ICD-10-CM | POA: Diagnosis not present

## 2022-03-27 DIAGNOSIS — R2689 Other abnormalities of gait and mobility: Secondary | ICD-10-CM | POA: Diagnosis not present

## 2022-03-27 DIAGNOSIS — M5451 Vertebrogenic low back pain: Secondary | ICD-10-CM | POA: Diagnosis not present

## 2022-04-01 DIAGNOSIS — R2689 Other abnormalities of gait and mobility: Secondary | ICD-10-CM | POA: Diagnosis not present

## 2022-04-01 DIAGNOSIS — M5451 Vertebrogenic low back pain: Secondary | ICD-10-CM | POA: Diagnosis not present

## 2022-04-02 DIAGNOSIS — M25552 Pain in left hip: Secondary | ICD-10-CM | POA: Diagnosis not present

## 2022-04-02 DIAGNOSIS — R531 Weakness: Secondary | ICD-10-CM | POA: Diagnosis not present

## 2022-04-03 DIAGNOSIS — R2689 Other abnormalities of gait and mobility: Secondary | ICD-10-CM | POA: Diagnosis not present

## 2022-04-03 DIAGNOSIS — M5451 Vertebrogenic low back pain: Secondary | ICD-10-CM | POA: Diagnosis not present

## 2022-04-08 DIAGNOSIS — M5451 Vertebrogenic low back pain: Secondary | ICD-10-CM | POA: Diagnosis not present

## 2022-04-08 DIAGNOSIS — R2689 Other abnormalities of gait and mobility: Secondary | ICD-10-CM | POA: Diagnosis not present

## 2022-04-09 ENCOUNTER — Other Ambulatory Visit: Payer: Self-pay | Admitting: Neurology

## 2022-04-10 ENCOUNTER — Ambulatory Visit
Admission: RE | Admit: 2022-04-10 | Discharge: 2022-04-10 | Disposition: A | Discharge status: Home / Self Care | Payer: Medicare Other | Source: Ambulatory Visit | Attending: Neurology | Admitting: Neurology

## 2022-04-10 DIAGNOSIS — R269 Unspecified abnormalities of gait and mobility: Secondary | ICD-10-CM

## 2022-04-10 DIAGNOSIS — R413 Other amnesia: Secondary | ICD-10-CM

## 2022-04-10 DIAGNOSIS — Z23 Encounter for immunization: Secondary | ICD-10-CM | POA: Diagnosis not present

## 2022-04-13 ENCOUNTER — Encounter: Payer: Self-pay | Admitting: Physical Medicine and Rehabilitation

## 2022-04-13 ENCOUNTER — Ambulatory Visit (INDEPENDENT_AMBULATORY_CARE_PROVIDER_SITE_OTHER): Payer: Medicare Other | Admitting: Physical Medicine and Rehabilitation

## 2022-04-13 ENCOUNTER — Telehealth: Payer: Self-pay | Admitting: Physical Medicine and Rehabilitation

## 2022-04-13 ENCOUNTER — Telehealth: Payer: Self-pay | Admitting: Neurology

## 2022-04-13 VITALS — BP 163/77 | HR 61 | Wt 213.0 lb

## 2022-04-13 DIAGNOSIS — M25552 Pain in left hip: Secondary | ICD-10-CM

## 2022-04-13 DIAGNOSIS — M79605 Pain in left leg: Secondary | ICD-10-CM | POA: Diagnosis not present

## 2022-04-13 DIAGNOSIS — M961 Postlaminectomy syndrome, not elsewhere classified: Secondary | ICD-10-CM | POA: Diagnosis not present

## 2022-04-13 DIAGNOSIS — I2510 Atherosclerotic heart disease of native coronary artery without angina pectoris: Secondary | ICD-10-CM

## 2022-04-13 NOTE — Progress Notes (Unsigned)
[name] - 81 y.o. male MRN [id number]  Date of birth: 08-13-1940  Office Visit Note: Visit Date: 04/13/2022 PCP: Lajean Manes, MD Referred by: Lajean Manes, MD  Subjective: Chief Complaint  Patient presents with   Left Hip - Pain    Rates at a 3 out of 10 currently    HPI: [name] is a 81 y.o. male who comes in today for evaluation of chronic, worsening and severe left lateral hip pain radiating to anterior thigh and intermittent down anterior leg to foot. Pain ongoing for several years, pain worsened over the last couple of months. States pain is exacerbated by movement and activity, describes as sore and sharp sensation, currently rates as 7 out of 10. Some relief of pain with home exercise regimen, rest and use of medications. He is currently undergoing formal physical therapy at [facility], taking Meloxicam with some relief of pain. Lumbar MRI imaging from 2022 exhibits prior left laminectomy at L2-L3, severe spinal canal stenosis at L3-L4 and moderate spinal canal stenosis at L4-L5. There is moderate bilateral facet arthropathy at L3-L4 and severe at L4-L5. Patient underwent L3-L4 and L4-L5 laminectomy/foraminotomy in July by Dr. Deri Fuelling. He recently followed up with Dr. Trenton Gammon, states he is concerned this could be inflammation or from arthritis. Patient states pain is negatively impacting daily life and interfering with physical therapy treatments. Patient denies focal weakness, numbness and tingling. Patient denies recent trauma or falls.    Review of Systems  Musculoskeletal:        Left lateral hip pain radiating down left leg.   Neurological:  Negative for tingling, sensory change, focal weakness and weakness.  All other systems reviewed and are negative.  Otherwise per HPI.  Assessment & Plan: Visit Diagnoses:    ICD-10-CM   1. Pain in left hip  M25.552 MR Lumbar Spine W Wo Contrast    2. 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 Patient continues to have severe pain despite good conservative therapies such as formal physical therapy, home exercise regimen, rest and use of medications. Patients clinical presentation and exam are complex, differentials could include lumbar radiculopathy vs facet mediated pain. We do not feel this is directly related to hip, no pain with internal/external rotation of left hip upon exam today. I did discuss continued monitoring with patient, however this seems to be significantly negatively impacting his daily life. Next step is to obtain lumbar MRI imaging with contrast. We did discuss possibility of performing lumbar injection depending on result of imaging. We do agree with Dr. Trenton Gammon and think this could be some type of inflammation, however imaging would not only rule out any red flag issues but provide patient with reassurance. We will follow up with patient after lumbar imaging has been obtained for review. He is to continue with regimen of Meloxicam and physical therapy as tolerated. No red flag symptoms noted upon exam today.     Meds & Orders: No orders of the defined types were placed in this encounter.   Orders Placed This Encounter  Procedures   MR Lumbar Spine W Wo Contrast    Follow-up: Return for follow up after lumbar MRI imaging is complete.  Procedures: No procedures performed      Clinical History: EXAM: MRI LUMBAR SPINE WITHOUT CONTRAST   TECHNIQUE: Multiplanar, multisequence MR imaging of the lumbar spine was performed. No intravenous contrast was administered.   COMPARISON:  X-ray lumbar 05/12/2021; MR lumbar 01/13/2014.   FINDINGS: Segmentation:  Standard.   Alignment:  Grade 1 anterolisthesis L3 on L4-L4 on L5.   Vertebrae: No acute  fracture, evidence of discitis, or aggressive bone lesion.   Conus medullaris and cauda equina: Conus extends to the T12-L1 level. Conus and cauda equina appear normal.   Paraspinal and other soft tissues: No acute paraspinal abnormality.   Disc levels:   Disc spaces: Disc desiccation throughout lumbar spine with relative sparing at L5-S1.   T12-L1: Mild broad-based disc bulge with a small central disc protrusion. No foraminal or central canal stenosis.   L1-L2: Minimal broad-based disc bulge. No foraminal or central canal stenosis.   L2-L3: Mild broad-based disc bulge with a broad right paracentral disc protrusion. Moderate right and moderate-severe left foraminal stenosis. Mild spinal stenosis. Prior left laminectomy.   L3-L4: Broad-based disc bulge flattening the ventral thecal sac. Moderate bilateral facet arthropathy. Severe spinal stenosis. Moderate bilateral foraminal stenosis.   L4-L5: Broad-based disc bulge. Severe bilateral facet arthropathy. Mild spinal stenosis scratch them moderate spinal stenosis. Moderate-severe bilateral foraminal stenosis.   L5-S1: No significant disc bulge. No neural foraminal stenosis. No central canal stenosis.   IMPRESSION: 1. Diffuse lumbar spine spondylosis as described above which has progressed compared with 01/13/2014. 2.  No acute osseous injury of the lumbar spine.     Electronically Signed   By: Kathreen Devoid M.D.   On: 06/04/2021 15:50   He reports that he quit smoking about 33 years ago. His smoking use included cigarettes. He has a 20.00 pack-year smoking history. He has never used smokeless tobacco.  Recent Labs    01/26/22 1020  HGBA1C 7.6*    Objective:  VS:  HT:    WT:213 lb (96.6 kg)  BMI:     BP: (!) 163/77  HR:61bpm  TEMP: ( )  RESP:  Physical Exam Vitals and nursing note reviewed.  HENT:     Head: Normocephalic and atraumatic.     Right Ear: External ear normal.     Left Ear: External ear normal.      Nose: Nose normal.     Mouth/Throat:     Mouth: Mucous membranes are moist.  Eyes:     Extraocular Movements: Extraocular movements intact.  Cardiovascular:     Rate and Rhythm: Normal rate.     Pulses: Normal pulses.  Pulmonary:     Effort: Pulmonary effort is normal.  Abdominal:     General: Abdomen is flat. There is no distension.  Musculoskeletal:        General: Tenderness present.     Cervical back: Normal range of motion.     Comments: Pt rises from seated position to standing without difficulty. Good lumbar range of motion. Strong distal strength without clonus, no pain upon palpation of greater trochanters. Sensation intact bilaterally. Walks independently, gait steady.   Skin:    General: Skin is warm and dry.     Capillary Refill: Capillary refill takes less than 2 seconds.  Neurological:     General: No focal deficit present.     Mental Status: He is alert.  Psychiatric:        Mood and Affect: Mood normal.        Behavior:  Behavior normal.     Ortho Exam  Imaging: No results found.  Past Medical/Family/Surgical/Social History: Medications & Allergies reviewed per EMR, new medications updated. 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   Gastro-esophageal reflux disease without esophagitis 11/05/2021   Hardening of the aorta (main artery of the heart) ([place]) 11/05/2021   History of malignant neoplasm of prostate 11/05/2021   Hypercholesterolemia 11/05/2021   Lumbar spondylosis 11/05/2021   Lymphoma ([place]) 11/05/2021   Sciatica 11/05/2021   Thrombocytopenia ([place]) 11/05/2021   Type 2 diabetes mellitus with other specified complication ([place]) 16/04/9603   Chronic bilateral low back pain with bilateral  sciatica 11/05/2021   Gait abnormality 11/05/2021   Trochanteric bursitis of left hip 11/06/2020   Pain in right knee 10/11/2018   Deviated nasal septum 04/01/2016   Sinusitis, chronic 04/01/2016   Chronic frontal sinusitis 02/04/2016   Nasal turbinate hypertrophy 02/04/2016   Obstructive sleep apnea 02/04/2016   Prostate cancer ([place])    Spinal stenosis in cervical region 04/15/2015   Lateral meniscal tear 03/27/2015   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   Past Medical History:  Diagnosis Date   Acute meniscal tear of right knee    Arthritis    Cancer ([place]) 2015   lymphoma new dx area removed behind right ear   Cancer ([place]) 2010   prostate   Deviated septum    chronic sinusitis, nasal turbinate hypertrophy   Diabetes mellitus without complication ([place])    on metformin   GERD (gastroesophageal reflux disease)    Hypertension    Pneumonia    Sleep apnea    wears CPAP   Wears glasses    Family History  Problem Relation Age of Onset   Heart attack Father    Heart attack Sister    Past Surgical History:  Procedure Laterality Date   back injection     to lower back   BACK SURGERY     lower back   EYE SURGERY Bilateral 2019   cataract   KNEE ARTHROSCOPY Right 03/27/2015   Procedure: RIGHT ARTHROSCOPY KNEE WITH DEBRIDEMENT, PARTIAL LATERAL MENISCECTOMY;  Surgeon: Gaynelle Arabian, MD;  Location: [facility];  Service: Orthopedics;  Laterality: Right;   LUMBAR LAMINECTOMY/DECOMPRESSION MICRODISCECTOMY Bilateral 01/27/2022   Procedure: Laminectomy and Foraminotomy - Bilateral - Lumbar Three-Four/Lumbar Four-Five;  Surgeon: Earnie Larsson, MD;  Location: [facility];  Service: Neurosurgery;  Laterality: Bilateral;  3C   NASAL SEPTOPLASTY W/ TURBINOPLASTY Bilateral 04/01/2016   Procedure: NASAL SEPTOPLASTY WITH BILATERAL INFERIOR  TURBINATE REDUCTION;  Surgeon: Jerrell Belfast, MD;  Location: [facility];  Service: ENT;  Laterality: Bilateral;   PROSTATECTOMY  07/20/2008    SINUS ENDO WITH FUSION Right 04/01/2016   Procedure: RIGHT ENDOSCOPIC SINUS SURGERY,AND CONSISTING  OF ANTERIOR ETHMOIDECTOMY;  Surgeon: Jerrell Belfast, MD;  Location: [facility];  Service: ENT;  Laterality: Right;   surgery for collapsed lung Left 35 to 40 years ago   Social History   Occupational History   Not on file  Tobacco Use   Smoking status: Former    Packs/day: 1.00    Years: 20.00    Total pack years: 20.00    Types: Cigarettes    Quit date: 1990    Years since quitting:  33.7   Smokeless tobacco: Never  Substance and Sexual Activity   Alcohol use: No   Drug use: No   Sexual activity: Not on file

## 2022-04-13 NOTE — Telephone Encounter (Signed)
scheduled

## 2022-04-13 NOTE — Telephone Encounter (Signed)
Please call patient and his wife, MRI of the brain showed  enlarged ventricle, out of proportion to the extent of cortical atrophy, with the possibility of normal pressure hydrocephalus,  Gave him that early follow-up appointment with me at my next available to review MRI and treatment plan     IMPRESSION: This MRI of the brain without contrast shows the following Ventriculomegaly out of proportion to the extent of cortical atrophy.  Additionally, sulci are effaced near the vertex.  This combination is often seen with normal pressure hydrocephalus. T2/FLAIR hyperintense foci in the hemispheres.  This is most likely due to chronic microvascular ischemic change.  Some of the foci are periventricular and demyelination is not excluded. The pituitary gland has a reduced height inside a mildly enlarged sella turcica.  This is usually an incidental finding but could also be seen with elevated intracranial pressure.   No acute findings

## 2022-04-13 NOTE — Telephone Encounter (Signed)
Pt called requesting a call to set an appt for left hip injection. Please call pt at [phone].

## 2022-04-14 ENCOUNTER — Encounter: Payer: Self-pay | Admitting: Neurology

## 2022-04-14 ENCOUNTER — Ambulatory Visit (INDEPENDENT_AMBULATORY_CARE_PROVIDER_SITE_OTHER): Payer: Medicare Other | Admitting: Neurology

## 2022-04-14 VITALS — BP 153/82 | HR 75 | Ht 71.0 in | Wt 221.0 lb

## 2022-04-14 DIAGNOSIS — R269 Unspecified abnormalities of gait and mobility: Secondary | ICD-10-CM | POA: Diagnosis not present

## 2022-04-14 DIAGNOSIS — G3184 Mild cognitive impairment, so stated: Secondary | ICD-10-CM

## 2022-04-14 DIAGNOSIS — R9089 Other abnormal findings on diagnostic imaging of central nervous system: Secondary | ICD-10-CM | POA: Insufficient documentation

## 2022-04-14 MED ORDER — OXYBUTYNIN CHLORIDE ER 5 MG PO TB24: 5.0000 mg | ORAL_TABLET | Freq: Every day | ORAL | 6 refills | Status: DC

## 2022-04-14 NOTE — Progress Notes (Signed)
Chief Complaint  Patient presents with   Follow-up    Rm 15. Accompanied by wife, [name]. MRI results.      ASSESSMENT AND PLAN  [name] is a 81 y.o. male   Status post lumbar decompression surgery July 2023  With continued low back pain, radiating pain to left hip, left hip pain, followed up by orthopedic surgeon, MRI of left hip pending  Cognitive impairment  [facility] examination 21/30,  MRI of the brain reviewed with patient and his wife, generalized atrophy, ventriculomegaly, seems to be out of proportion to the extent of cortical atrophy  But on further questioning, his cognitive impairment, gait abnormalities, urinary incon tinence happened gradually, it was not acute change, history does not suggestive of normal pressure hydrocephalus, his gait difficulty likely due to low back pain, hip pain,  Will repeat MRI in 6 months for progress,  Gait abnormality,  He does have noticeable bilateral hip flexion weakness, brisk bilateral upper and patellar reflex, intermittent neck pain,  MRI of cervical spine to rule out cervical spondylitic myelopathy   Worsening nocturia,  Wake up 5-6 times every night   Add oxybutynin ER 5 mg every night   DIAGNOSTIC DATA (LABS, IMAGING, TESTING) - I reviewed patient records, labs, notes, testing and imaging myself where available.  Laboratory evaluation February 2023, triglyceride 124, LDL 73 A1c 7.2, normal CMP, CBC with hemoglobin of 13.0, A1c 7.2,  MEDICAL HISTORY:  [name], is a 81 year old male seen in request by his primary care physician Dr. Felipa Eth, Christiane Ha, for evaluation of hand tremor, he is accompanied by his wife at today's visit on November 05, 2021  I reviewed and summarized the referring note. PMHX. 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 Deri Fuelling January 27, 2022  This has helped his low back pain, while in the rehab he was doing very well, able to ambulate much better  But since discharge, wife reported that he continued to regress, tends to sit, feels frustrated easily, rely on his walker sometimes, bending over, he has not started home physical therapy yet  He describes low back pain with positional change, such as getting up or sitting down otherwise he denies significant low back pain, denies significant bowel and bladder incontinence  Wife noted he has mild memory trouble, tends to be irritable, today's [facility] examination 21/30,  Update April 14, 2022 He came in with his wife to review MRI of the brain from March 21, 2022, generalized cortical atrophy, ventriculomegaly,  seems to be out of proportion to the extent of cortical atrophy, mild small vessel disease  But patient's slow onset gait abnormality, low back pain, recent noticeable mild cognitive impairment, and urinary urgency, frequent nocturia, are all slow progress, there was  no acute change, history does not suggestive of normal pressure hydrocephalus in addition, his gait does not consistent with typical magnetic gait, or parkinsonian features,  There was no previous imaging to compare, will repeat MRI of the brain in 6 months  He does complains of bilateral hip muscle weakness, limited by left hip pain, MRI of left hip is pending by orthopedic surgeon  PHYSICAL EXAM:   Vitals:   04/14/22 1528  BP: (!) 153/82  Pulse: 75  Weight: 221 lb (100.2 kg)  Height: '5\' 11"'$  (1.803 m)   Not recorded     Body mass index is 30.82 kg/m.  PHYSICAL EXAMNIATION:  Gen: NAD, conversant, well nourised, well groomed                     Cardiovascular: Regular rate rhythm, no peripheral edema, warm, nontender. Eyes: Conjunctivae clear without exudates or hemorrhage Neck: Supple, no carotid bruits. Pulmonary: Clear to auscultation bilaterally   NEUROLOGICAL EXAM:  MENTAL STATUS: Speech/cognition Awake, alert, oriented to history taking care of conversation    03/17/2022   11:00 AM  Montreal Cognitive Assessment   Visuospatial/ Executive (0/5) 3  Naming (0/3) 3  Attention: Read list of digits (0/2) 2  Attention: Read list of letters (0/1) 0  Attention: Serial 7 subtraction starting at 100 (0/3) 2  Language: Repeat phrase (0/2) 2  Language : Fluency (0/1) 1  Abstraction (0/2) 2  Delayed Recall (0/5) 0  Orientation (0/6) 6  Total 21    CRANIAL NERVES: CN II: Visual fields are full to confrontation. Pupils are round equal and briskly reactive to light. CN III, IV, VI: extraocular movement are normal. No ptosis. CN V: Facial sensation is intact to light touch CN VII: Face is symmetric with normal eye closure  CN VIII: Hearing is normal to causal conversation. CN IX, X: Phonation is normal. CN XI: Head turning and shoulder shrug are intact  MOTOR: Mild bilateral hip flexion weakness, no bradykinesia, rigidity  REFLEXES: Reflexes are 1 and symmetric at the  biceps, triceps, 3/3 knees, and ankles. Plantar responses are flexor.  SENSORY: Intact to light touch, pinprick and vibratory sensation are intact in fingers and toes.  COORDINATION: There is no trunk or limb dysmetria noted.  GAIT/STANCE: Need push-up to get up from seated position, mildly wide-based, cautious  REVIEW OF SYSTEMS:  Full 14 system review of systems performed and notable only for as above All other review of systems were negative.   ALLERGIES: Allergies  Allergen Reactions   Atorvastatin     Other reaction(s): thrombocytopenia   Tizanidine Hcl     Other reaction(s): dizziness    HOME MEDICATIONS: Current Outpatient Medications  Medication Sig Dispense Refill   amLODipine (NORVASC) 10 MG tablet Take 10 mg by mouth every morning.      aspirin EC 81 MG tablet Take 81 mg by mouth daily. Swallow whole.     atenolol (TENORMIN) 50 MG tablet Take 25 mg by mouth every morning.      cetirizine (ZYRTEC) 10 MG tablet Take 10 mg by mouth daily as needed for allergies (as needed).     DULoxetine (CYMBALTA) 30 MG capsule TAKE 1 CAPSULE BY MOUTH EVERY DAY 90 capsule 4  HYDROcodone-acetaminophen (NORCO/VICODIN) 5-325 MG tablet Take 1 tablet by mouth every 4 (four) hours as needed for moderate pain ((score 4 to 6)). 30 tablet 0   lisinopril (PRINIVIL,ZESTRIL) 20 MG tablet Take 20 mg by mouth every morning.      Melatonin 3 MG TABS Take 3 mg by mouth at bedtime as needed (sleep).     metFORMIN (GLUCOPHAGE) 1000 MG tablet Take 1,000 mg by mouth daily. Reported 01/21/21     NON FORMULARY Pt uses a cpap nightly     pantoprazole (PROTONIX) 40 MG tablet Take 40 mg by mouth daily as needed (heart burn/as needed).  6   rosuvastatin (CRESTOR) 10 MG tablet Take 10 mg by mouth at bedtime.     sodium chloride (OCEAN) 0.65 % SOLN nasal spray Place 1 spray into both nostrils as needed for congestion.     spironolactone (ALDACTONE) 25 MG tablet Take 25 mg by mouth daily.     timolol (TIMOPTIC)  0.5 % ophthalmic solution Place 1 drop into both eyes every morning.     No current facility-administered medications for this visit.    PAST MEDICAL HISTORY: Past Medical History:  Diagnosis Date   Acute meniscal tear of right knee    Arthritis    Cancer ([place]) 2015   lymphoma new dx area removed behind right ear   Cancer ([place]) 2010   prostate   Deviated septum    chronic sinusitis, nasal turbinate hypertrophy   Diabetes mellitus without complication ([place])    on metformin   GERD (gastroesophageal reflux disease)    Hypertension    Pneumonia    Sleep apnea    wears CPAP   Wears glasses     PAST SURGICAL HISTORY: Past Surgical History:  Procedure Laterality Date   back injection     to lower back   BACK SURGERY     lower back   EYE SURGERY Bilateral 2019   cataract   KNEE ARTHROSCOPY Right 03/27/2015   Procedure: RIGHT ARTHROSCOPY KNEE WITH DEBRIDEMENT, PARTIAL LATERAL MENISCECTOMY;  Surgeon: Gaynelle Arabian, MD;  Location: [facility];  Service: Orthopedics;  Laterality: Right;   LUMBAR LAMINECTOMY/DECOMPRESSION MICRODISCECTOMY Bilateral 01/27/2022   Procedure: Laminectomy and Foraminotomy - Bilateral - Lumbar Three-Four/Lumbar Four-Five;  Surgeon: Earnie Larsson, MD;  Location: [facility];  Service: Neurosurgery;  Laterality: Bilateral;  3C   NASAL SEPTOPLASTY W/ TURBINOPLASTY Bilateral 04/01/2016   Procedure: NASAL SEPTOPLASTY WITH BILATERAL INFERIOR  TURBINATE REDUCTION;  Surgeon: Jerrell Belfast, MD;  Location: [facility];  Service: ENT;  Laterality: Bilateral;   PROSTATECTOMY  07/20/2008   SINUS ENDO WITH FUSION Right 04/01/2016   Procedure: RIGHT ENDOSCOPIC SINUS SURGERY,AND CONSISTING  OF ANTERIOR ETHMOIDECTOMY;  Surgeon: Jerrell Belfast, MD;  Location: [facility];  Service: ENT;  Laterality: Right;   surgery for collapsed lung Left 35 to 40 years ago    FAMILY HISTORY: Family History  Problem Relation Age of Onset   Heart attack Father    Heart attack Sister     SOCIAL  HISTORY: Social History   Socioeconomic History   Marital status: Married    Spouse name: Not on file   Number of children: Not on file   Years of education: Not on file   Highest education level: Not on file  Occupational History   Not on file  Tobacco Use   Smoking status: Former    Packs/day: 1.00    Years: 20.00    Total pack years: 20.00    Types: Cigarettes  Quit date: 31    Years since quitting: 33.7   Smokeless tobacco: Never  Substance and Sexual Activity   Alcohol use: No   Drug use: No   Sexual activity: Not on file  Other Topics Concern   Not on file  Social History Narrative   Not on file   Social Determinants of Health   Financial Resource Strain: Not on file  Food Insecurity: Not on file  Transportation Needs: Not on file  Physical Activity: Not on file  Stress: Not on file  Social Connections: Not on file  Intimate Partner Violence: Not on file      Marcial Pacas, M.D. Ph.D.  [facility] [address], [place], [place] [address] Ph: [phone] Fax: [phone]  CC:  Lajean Manes, [address] [place],  [place] [id number]  Lajean Manes, MD

## 2022-04-14 NOTE — Telephone Encounter (Signed)
I spoke with the patient and provided him with results. I booked the patient an appointment for today to go over MRI results. He expressed appreciation for the call.

## 2022-04-15 ENCOUNTER — Telehealth: Payer: Self-pay | Admitting: Neurology

## 2022-04-15 DIAGNOSIS — R2689 Other abnormalities of gait and mobility: Secondary | ICD-10-CM | POA: Diagnosis not present

## 2022-04-15 DIAGNOSIS — M5451 Vertebrogenic low back pain: Secondary | ICD-10-CM | POA: Diagnosis not present

## 2022-04-15 NOTE — Telephone Encounter (Signed)
medicare/[place] NPR sent to GI

## 2022-04-17 DIAGNOSIS — R2689 Other abnormalities of gait and mobility: Secondary | ICD-10-CM | POA: Diagnosis not present

## 2022-04-17 DIAGNOSIS — M5451 Vertebrogenic low back pain: Secondary | ICD-10-CM | POA: Diagnosis not present

## 2022-04-22 DIAGNOSIS — M5451 Vertebrogenic low back pain: Secondary | ICD-10-CM | POA: Diagnosis not present

## 2022-04-22 DIAGNOSIS — R2689 Other abnormalities of gait and mobility: Secondary | ICD-10-CM | POA: Diagnosis not present

## 2022-04-24 DIAGNOSIS — M5451 Vertebrogenic low back pain: Secondary | ICD-10-CM | POA: Diagnosis not present

## 2022-04-24 DIAGNOSIS — R2689 Other abnormalities of gait and mobility: Secondary | ICD-10-CM | POA: Diagnosis not present

## 2022-04-27 DIAGNOSIS — R2689 Other abnormalities of gait and mobility: Secondary | ICD-10-CM | POA: Diagnosis not present

## 2022-04-27 DIAGNOSIS — M5451 Vertebrogenic low back pain: Secondary | ICD-10-CM | POA: Diagnosis not present

## 2022-04-28 ENCOUNTER — Ambulatory Visit
Admission: RE | Admit: 2022-04-28 | Discharge: 2022-04-28 | Disposition: A | Discharge status: Home / Self Care | Payer: Medicare Other | Source: Ambulatory Visit | Attending: Neurology | Admitting: Neurology

## 2022-04-28 ENCOUNTER — Ambulatory Visit
Admission: RE | Admit: 2022-04-28 | Discharge: 2022-04-28 | Disposition: A | Discharge status: Home / Self Care | Payer: Medicare Other | Source: Ambulatory Visit | Attending: Physical Medicine and Rehabilitation | Admitting: Physical Medicine and Rehabilitation

## 2022-04-28 DIAGNOSIS — M25552 Pain in left hip: Secondary | ICD-10-CM | POA: Diagnosis not present

## 2022-04-28 DIAGNOSIS — R269 Unspecified abnormalities of gait and mobility: Secondary | ICD-10-CM

## 2022-04-28 DIAGNOSIS — G3184 Mild cognitive impairment, so stated: Secondary | ICD-10-CM | POA: Diagnosis not present

## 2022-04-28 DIAGNOSIS — M961 Postlaminectomy syndrome, not elsewhere classified: Secondary | ICD-10-CM | POA: Diagnosis not present

## 2022-04-28 DIAGNOSIS — M79605 Pain in left leg: Secondary | ICD-10-CM | POA: Diagnosis not present

## 2022-04-28 MED ORDER — GADOPICLENOL 0.5 MMOL/ML IV SOLN
10.0000 mL | Freq: Once | INTRAVENOUS | Status: AC | PRN
  Administered 2022-04-28: 10 mL via INTRAVENOUS

## 2022-04-29 ENCOUNTER — Telehealth: Payer: Self-pay | Admitting: Physical Medicine and Rehabilitation

## 2022-04-29 NOTE — Telephone Encounter (Signed)
I called and spoke with patient this morning, we did discuss recent lumbar MRI imaging, worsening stenosis noted at L2-L3, level above surgery. Myself and Dr. Ernestina Patches feel best for patient to follow up with Dr. Trenton Gammon to discuss treatment. We would consider epidural steroid injection if Dr. Trenton Gammon thinks this would be best. I did leave copies of MRI reports at front desk for patient to pick up.

## 2022-04-30 ENCOUNTER — Telehealth: Payer: Self-pay | Admitting: Neurology

## 2022-04-30 DIAGNOSIS — M5451 Vertebrogenic low back pain: Secondary | ICD-10-CM | POA: Diagnosis not present

## 2022-04-30 DIAGNOSIS — R2689 Other abnormalities of gait and mobility: Secondary | ICD-10-CM | POA: Diagnosis not present

## 2022-04-30 NOTE — Telephone Encounter (Signed)
Please call patient, MRI of cervical showed moderate stenosis at C6-7, and variable degree of foraminal narrow.  MRI of lumbar moderate stenosis at L2-3,  We will go over above findings in detail at next follow up visit, call office for worsening neck low back pian and gait abnormalities.    MRI cervical spine (without) demonstrating: - At C6-7 posterior disc bulging and facet hypertrophy with moderate spinal stenosis and severe bilateral foraminal stenosis. - At C4-5 posterior leftward disc bulging and facet hypertrophy with mild spinal stenosis and severe left foraminal stenosis. - At C5-6 broad posterior disc bulging and facet hypertrophy with mild spinal stenosis and severe bilateral foraminal stenosis. - Compared to MRI from 02/28/2015 there has been mild progression of degenerative changes.  1. L2-3 progressed, moderate spinal stenosis. Left foraminal and right subarticular recess impingement at this level which is chronic. 2. L3-4 interval posterior decompression with improved thecal sac patency. Spinal stenosis is now moderate. Moderate bilateral foraminal narrowing which is chronic. 3. L4-5 interval bilateral subarticular recess decompression. Moderate spinal stenosis and biforaminal impingement.

## 2022-05-04 NOTE — Telephone Encounter (Signed)
I called patient.  I discussed his MRI results and recommendations.  Patient is recovering from a laminectomy.  He will keep his follow-ups with his neurosurgeon as well.  He will keep his follow-up with [place] in February.  He will call us with worsening neck or lower back pain and gait abnormalities.  Patient verbalized understanding of results.  Patient had no further questions or concerns at this time

## 2022-05-06 DIAGNOSIS — M5451 Vertebrogenic low back pain: Secondary | ICD-10-CM | POA: Diagnosis not present

## 2022-05-06 DIAGNOSIS — R2689 Other abnormalities of gait and mobility: Secondary | ICD-10-CM | POA: Diagnosis not present

## 2022-05-08 DIAGNOSIS — M5451 Vertebrogenic low back pain: Secondary | ICD-10-CM | POA: Diagnosis not present

## 2022-05-08 DIAGNOSIS — R2689 Other abnormalities of gait and mobility: Secondary | ICD-10-CM | POA: Diagnosis not present

## 2022-05-13 DIAGNOSIS — M5451 Vertebrogenic low back pain: Secondary | ICD-10-CM | POA: Diagnosis not present

## 2022-05-13 DIAGNOSIS — R2689 Other abnormalities of gait and mobility: Secondary | ICD-10-CM | POA: Diagnosis not present

## 2022-05-15 DIAGNOSIS — M5451 Vertebrogenic low back pain: Secondary | ICD-10-CM | POA: Diagnosis not present

## 2022-05-15 DIAGNOSIS — R2689 Other abnormalities of gait and mobility: Secondary | ICD-10-CM | POA: Diagnosis not present

## 2022-05-18 DIAGNOSIS — R2689 Other abnormalities of gait and mobility: Secondary | ICD-10-CM | POA: Diagnosis not present

## 2022-05-18 DIAGNOSIS — M5451 Vertebrogenic low back pain: Secondary | ICD-10-CM | POA: Diagnosis not present

## 2022-05-28 DIAGNOSIS — M48062 Spinal stenosis, lumbar region with neurogenic claudication: Secondary | ICD-10-CM | POA: Diagnosis not present

## 2022-06-16 DIAGNOSIS — M48062 Spinal stenosis, lumbar region with neurogenic claudication: Secondary | ICD-10-CM | POA: Diagnosis not present

## 2022-06-30 DIAGNOSIS — I129 Hypertensive chronic kidney disease with stage 1 through stage 4 chronic kidney disease, or unspecified chronic kidney disease: Secondary | ICD-10-CM | POA: Diagnosis not present

## 2022-06-30 DIAGNOSIS — M5442 Lumbago with sciatica, left side: Secondary | ICD-10-CM | POA: Diagnosis not present

## 2022-06-30 DIAGNOSIS — E1169 Type 2 diabetes mellitus with other specified complication: Secondary | ICD-10-CM | POA: Diagnosis not present

## 2022-06-30 DIAGNOSIS — Z23 Encounter for immunization: Secondary | ICD-10-CM | POA: Diagnosis not present

## 2022-06-30 DIAGNOSIS — R269 Unspecified abnormalities of gait and mobility: Secondary | ICD-10-CM | POA: Diagnosis not present

## 2022-06-30 DIAGNOSIS — M48062 Spinal stenosis, lumbar region with neurogenic claudication: Secondary | ICD-10-CM | POA: Diagnosis not present

## 2022-07-02 DIAGNOSIS — H5213 Myopia, bilateral: Secondary | ICD-10-CM | POA: Diagnosis not present

## 2022-07-02 DIAGNOSIS — H40053 Ocular hypertension, bilateral: Secondary | ICD-10-CM | POA: Diagnosis not present

## 2022-07-02 DIAGNOSIS — Z961 Presence of intraocular lens: Secondary | ICD-10-CM | POA: Diagnosis not present

## 2022-07-08 DIAGNOSIS — M6281 Muscle weakness (generalized): Secondary | ICD-10-CM | POA: Diagnosis not present

## 2022-07-08 DIAGNOSIS — M25552 Pain in left hip: Secondary | ICD-10-CM | POA: Diagnosis not present

## 2022-07-08 DIAGNOSIS — M5459 Other low back pain: Secondary | ICD-10-CM | POA: Diagnosis not present

## 2022-07-08 DIAGNOSIS — R262 Difficulty in walking, not elsewhere classified: Secondary | ICD-10-CM | POA: Diagnosis not present

## 2022-07-15 DIAGNOSIS — R262 Difficulty in walking, not elsewhere classified: Secondary | ICD-10-CM | POA: Diagnosis not present

## 2022-07-15 DIAGNOSIS — M25552 Pain in left hip: Secondary | ICD-10-CM | POA: Diagnosis not present

## 2022-07-15 DIAGNOSIS — M6281 Muscle weakness (generalized): Secondary | ICD-10-CM | POA: Diagnosis not present

## 2022-07-15 DIAGNOSIS — M5459 Other low back pain: Secondary | ICD-10-CM | POA: Diagnosis not present

## 2022-07-21 DIAGNOSIS — R262 Difficulty in walking, not elsewhere classified: Secondary | ICD-10-CM | POA: Diagnosis not present

## 2022-07-21 DIAGNOSIS — M6281 Muscle weakness (generalized): Secondary | ICD-10-CM | POA: Diagnosis not present

## 2022-07-21 DIAGNOSIS — M25552 Pain in left hip: Secondary | ICD-10-CM | POA: Diagnosis not present

## 2022-07-21 DIAGNOSIS — M5459 Other low back pain: Secondary | ICD-10-CM | POA: Diagnosis not present

## 2022-07-24 DIAGNOSIS — M5459 Other low back pain: Secondary | ICD-10-CM | POA: Diagnosis not present

## 2022-07-24 DIAGNOSIS — M25552 Pain in left hip: Secondary | ICD-10-CM | POA: Diagnosis not present

## 2022-07-24 DIAGNOSIS — R262 Difficulty in walking, not elsewhere classified: Secondary | ICD-10-CM | POA: Diagnosis not present

## 2022-07-24 DIAGNOSIS — M6281 Muscle weakness (generalized): Secondary | ICD-10-CM | POA: Diagnosis not present

## 2022-07-28 DIAGNOSIS — M25552 Pain in left hip: Secondary | ICD-10-CM | POA: Diagnosis not present

## 2022-07-28 DIAGNOSIS — M6281 Muscle weakness (generalized): Secondary | ICD-10-CM | POA: Diagnosis not present

## 2022-07-28 DIAGNOSIS — R262 Difficulty in walking, not elsewhere classified: Secondary | ICD-10-CM | POA: Diagnosis not present

## 2022-07-28 DIAGNOSIS — M5459 Other low back pain: Secondary | ICD-10-CM | POA: Diagnosis not present

## 2022-08-04 DIAGNOSIS — M25552 Pain in left hip: Secondary | ICD-10-CM | POA: Diagnosis not present

## 2022-08-04 DIAGNOSIS — M5459 Other low back pain: Secondary | ICD-10-CM | POA: Diagnosis not present

## 2022-08-04 DIAGNOSIS — R262 Difficulty in walking, not elsewhere classified: Secondary | ICD-10-CM | POA: Diagnosis not present

## 2022-08-04 DIAGNOSIS — M6281 Muscle weakness (generalized): Secondary | ICD-10-CM | POA: Diagnosis not present

## 2022-08-06 DIAGNOSIS — M48062 Spinal stenosis, lumbar region with neurogenic claudication: Secondary | ICD-10-CM | POA: Diagnosis not present

## 2022-08-06 DIAGNOSIS — Z6831 Body mass index (BMI) 31.0-31.9, adult: Secondary | ICD-10-CM | POA: Diagnosis not present

## 2022-08-11 DIAGNOSIS — M6281 Muscle weakness (generalized): Secondary | ICD-10-CM | POA: Diagnosis not present

## 2022-08-11 DIAGNOSIS — M5459 Other low back pain: Secondary | ICD-10-CM | POA: Diagnosis not present

## 2022-08-11 DIAGNOSIS — M25552 Pain in left hip: Secondary | ICD-10-CM | POA: Diagnosis not present

## 2022-08-11 DIAGNOSIS — R262 Difficulty in walking, not elsewhere classified: Secondary | ICD-10-CM | POA: Diagnosis not present

## 2022-08-12 ENCOUNTER — Other Ambulatory Visit: Payer: Self-pay | Admitting: Neurosurgery

## 2022-08-12 DIAGNOSIS — M48062 Spinal stenosis, lumbar region with neurogenic claudication: Secondary | ICD-10-CM

## 2022-08-14 DIAGNOSIS — M6281 Muscle weakness (generalized): Secondary | ICD-10-CM | POA: Diagnosis not present

## 2022-08-14 DIAGNOSIS — M25552 Pain in left hip: Secondary | ICD-10-CM | POA: Diagnosis not present

## 2022-08-14 DIAGNOSIS — M5459 Other low back pain: Secondary | ICD-10-CM | POA: Diagnosis not present

## 2022-08-14 DIAGNOSIS — R262 Difficulty in walking, not elsewhere classified: Secondary | ICD-10-CM | POA: Diagnosis not present

## 2022-08-18 DIAGNOSIS — R262 Difficulty in walking, not elsewhere classified: Secondary | ICD-10-CM | POA: Diagnosis not present

## 2022-08-18 DIAGNOSIS — M6281 Muscle weakness (generalized): Secondary | ICD-10-CM | POA: Diagnosis not present

## 2022-08-18 DIAGNOSIS — M5459 Other low back pain: Secondary | ICD-10-CM | POA: Diagnosis not present

## 2022-08-18 DIAGNOSIS — M25552 Pain in left hip: Secondary | ICD-10-CM | POA: Diagnosis not present

## 2022-08-19 ENCOUNTER — Ambulatory Visit
Admission: RE | Admit: 2022-08-19 | Discharge: 2022-08-19 | Disposition: A | Discharge status: Home / Self Care | Payer: Medicare Other | Source: Ambulatory Visit | Attending: Neurosurgery | Admitting: Neurosurgery

## 2022-08-19 DIAGNOSIS — M48062 Spinal stenosis, lumbar region with neurogenic claudication: Secondary | ICD-10-CM

## 2022-08-19 DIAGNOSIS — M4316 Spondylolisthesis, lumbar region: Secondary | ICD-10-CM | POA: Diagnosis not present

## 2022-08-19 DIAGNOSIS — M5416 Radiculopathy, lumbar region: Secondary | ICD-10-CM | POA: Diagnosis not present

## 2022-08-19 DIAGNOSIS — M5126 Other intervertebral disc displacement, lumbar region: Secondary | ICD-10-CM | POA: Diagnosis not present

## 2022-08-19 MED ORDER — ONDANSETRON HCL 4 MG/2ML IJ SOLN: 4.0000 mg | Freq: Once | INTRAMUSCULAR | Status: DC | PRN

## 2022-08-19 MED ORDER — IOPAMIDOL (ISOVUE-M 200) INJECTION 41%
18.0000 mL | Freq: Once | INTRAMUSCULAR | Status: AC
  Administered 2022-08-19: 18 mL via INTRATHECAL

## 2022-08-19 MED ORDER — DIAZEPAM 5 MG PO TABS
5.0000 mg | ORAL_TABLET | Freq: Once | ORAL | Status: AC
Start: 2022-08-19 — End: 2022-08-19
  Administered 2022-08-19: 5 mg via ORAL

## 2022-08-19 MED ORDER — MEPERIDINE HCL 50 MG/ML IJ SOLN: 50.0000 mg | Freq: Once | INTRAMUSCULAR | Status: DC | PRN

## 2022-08-19 NOTE — Discharge Instructions (Signed)

## 2022-08-25 DIAGNOSIS — M25552 Pain in left hip: Secondary | ICD-10-CM | POA: Diagnosis not present

## 2022-08-25 DIAGNOSIS — M5459 Other low back pain: Secondary | ICD-10-CM | POA: Diagnosis not present

## 2022-08-25 DIAGNOSIS — R262 Difficulty in walking, not elsewhere classified: Secondary | ICD-10-CM | POA: Diagnosis not present

## 2022-08-25 DIAGNOSIS — M6281 Muscle weakness (generalized): Secondary | ICD-10-CM | POA: Diagnosis not present

## 2022-08-26 DIAGNOSIS — Z6830 Body mass index (BMI) 30.0-30.9, adult: Secondary | ICD-10-CM | POA: Diagnosis not present

## 2022-08-26 DIAGNOSIS — M48061 Spinal stenosis, lumbar region without neurogenic claudication: Secondary | ICD-10-CM | POA: Diagnosis not present

## 2022-08-27 ENCOUNTER — Other Ambulatory Visit: Payer: Self-pay | Admitting: Neurosurgery

## 2022-08-27 DIAGNOSIS — M5459 Other low back pain: Secondary | ICD-10-CM | POA: Diagnosis not present

## 2022-08-27 DIAGNOSIS — M6281 Muscle weakness (generalized): Secondary | ICD-10-CM | POA: Diagnosis not present

## 2022-08-27 DIAGNOSIS — R262 Difficulty in walking, not elsewhere classified: Secondary | ICD-10-CM | POA: Diagnosis not present

## 2022-08-27 DIAGNOSIS — M25552 Pain in left hip: Secondary | ICD-10-CM | POA: Diagnosis not present

## 2022-08-31 NOTE — Progress Notes (Signed)
Surgical Instructions    Your procedure is scheduled on Friday, 09/04/22.  Report to [facility] Main Entrance "A" at 7:30 A.M., then check in with the Admitting office.  Call this number if you have problems the morning of surgery:  [phone]   If you have any questions prior to your surgery date call [phone]: Open Monday-Friday 8am-4pm If you experience any cold or flu symptoms such as cough, fever, chills, shortness of breath, etc. between now and your scheduled surgery, please notify us at the above number     Remember:  Do not eat or drink after midnight the night before your surgery     Take these medicines the morning of surgery with A SIP OF WATER:  amLODipine (NORVASC)  atenolol (TENORMIN)  DULoxetine (CYMBALTA)  timolol (TIMOPTIC) eye drops  IF NEEDED: cetirizine (ZYRTEC)  HYDROcodone-acetaminophen (NORCO/VICODIN)  pantoprazole (PROTONIX)  sodium chloride (OCEAN) nasal spray  As of today, STOP taking any Aspirin (unless otherwise instructed by your surgeon) Aleve, Naproxen, Ibuprofen, Motrin, Advil, Goody's, BC's, all herbal medications, fish oil, and all vitamins.  WHAT DO I DO ABOUT MY DIABETES MEDICATION?   Do not take oral diabetes medicines (pills) the morning of surgery.  THE MORNING OF SURGERY, do not take metFORMIN (GLUCOPHAGE).  The day of surgery, do not take other diabetes injectables, including Byetta (exenatide), Bydureon (exenatide ER), Victoza (liraglutide), or Trulicity (dulaglutide).  If your CBG is greater than 220 mg/dL, you may take  of your sliding scale (correction) dose of insulin.   HOW TO MANAGE YOUR DIABETES BEFORE AND AFTER SURGERY  Why is it important to control my blood sugar before and after surgery? Improving blood sugar levels before and after surgery helps healing and can limit problems. A way of improving blood sugar control is eating a healthy diet by:  Eating less sugar and carbohydrates  Increasing  activity/exercise  Talking with your doctor about reaching your blood sugar goals High blood sugars (greater than 180 mg/dL) can raise your risk of infections and slow your recovery, so you will need to focus on controlling your diabetes during the weeks before surgery. Make sure that the doctor who takes care of your diabetes knows about your planned surgery including the date and location.  How do I manage my blood sugar before surgery? Check your blood sugar at least 4 times a day, starting 2 days before surgery, to make sure that the level is not too high or low.  Check your blood sugar the morning of your surgery when you wake up and every 2 hours until you get to the Short Stay unit.  If your blood sugar is less than 70 mg/dL, you will need to treat for low blood sugar: Do not take insulin. Treat a low blood sugar (less than 70 mg/dL) with  cup of clear juice (cranberry or apple), 4 glucose tablets, OR glucose gel. Recheck blood sugar in 15 minutes after treatment (to make sure it is greater than 70 mg/dL). If your blood sugar is not greater than 70 mg/dL on recheck, call [phone] for further instructions. Report your blood sugar to the short stay nurse when you get to Short Stay.  If you are admitted to the hospital after surgery: Your blood sugar will be checked by the staff and you will probably be given insulin after surgery (instead of oral diabetes medicines) to make sure you have good blood sugar levels. The goal for blood sugar control after surgery is 80-180 mg/dL.  Do not wear jewelry or makeup. Do not wear lotions, powders, cologne or deodorant. Men may shave face and neck. Do not bring valuables to the hospital. Do not wear nail polish, gel polish, artificial nails, or any other type of covering on natural nails (fingers and toes) If you have artificial nails or gel coating that need to be removed by a nail salon, please have this removed prior to surgery.  Artificial nails or gel coating may interfere with anesthesia's ability to adequately monitor your vital signs.  [facility] is not responsible for any belongings or valuables.    Do NOT Smoke (Tobacco/Vaping)  24 hours prior to your procedure  If you use a CPAP at night, you may bring your mask for your overnight stay.   Contacts, glasses, hearing aids, dentures or partials may not be worn into surgery, please bring cases for these belongings   For patients admitted to the hospital, discharge time will be determined by your treatment team.   Patients discharged the day of surgery will not be allowed to drive home, and someone needs to stay with them for 24 hours.   SURGICAL WAITING ROOM VISITATION Patients having surgery or a procedure may have no more than 2 support people in the waiting area - these visitors may rotate.   Children under the age of 82 must have an adult with them who is not the patient. If the patient needs to stay at the hospital during part of their recovery, the visitor guidelines for inpatient rooms apply. Pre-op nurse will coordinate an appropriate time for 1 support person to accompany patient in pre-op.  This support person may not rotate.   Please refer to [email or web address] for the visitor guidelines for Inpatients (after your surgery is over and you are in a regular room).    Special instructions:    Oral Hygiene is also important to reduce your risk of infection.  Remember - BRUSH YOUR TEETH THE MORNING OF SURGERY WITH YOUR REGULAR TOOTHPASTE   [facility]- Preparing For Surgery  Before surgery, you can play an important role. Because skin is not sterile, your skin needs to be as free of germs as possible. You can reduce the number of germs on your skin by washing with CHG (chlorahexidine gluconate) Soap before surgery.  CHG is an antiseptic cleaner which kills germs and bonds with the skin to continue  killing germs even after washing.     Please do not use if you have an allergy to CHG or antibacterial soaps. If your skin becomes reddened/irritated stop using the CHG.  Do not shave (including legs and underarms) for at least 48 hours prior to first CHG shower. It is OK to shave your face.  Please follow these instructions carefully.     Shower the NIGHT BEFORE SURGERY and the MORNING OF SURGERY with CHG Soap.   If you chose to wash your hair, wash your hair first as usual with your normal shampoo. After you shampoo, rinse your hair and body thoroughly to remove the shampoo.  Then [employer] and genitals (private parts) with your normal soap and rinse thoroughly to remove soap.  After that Use CHG Soap as you would any other liquid soap. You can apply CHG directly to the skin and wash gently with a scrungie or a clean washcloth.   Apply the CHG Soap to your body ONLY FROM THE NECK DOWN.  Do not use on open wounds or open sores. Avoid contact with your  eyes, ears, mouth and genitals (private parts). Wash Face and genitals (private parts)  with your normal soap.   Wash thoroughly, paying special attention to the area where your surgery will be performed.  Thoroughly rinse your body with warm water from the neck down.  DO NOT shower/wash with your normal soap after using and rinsing off the CHG Soap.  Pat yourself dry with a CLEAN TOWEL.  Wear CLEAN PAJAMAS to bed the night before surgery  Place CLEAN SHEETS on your bed the night before your surgery  DO NOT SLEEP WITH PETS.   Day of Surgery: Take a shower with CHG soap. Wear Clean/Comfortable clothing the morning of surgery Do not apply any deodorants/lotions.   Remember to brush your teeth WITH YOUR REGULAR TOOTHPASTE.    If you received a COVID test during your pre-op visit, it is requested that you wear a mask when out in public, stay away from anyone that may not be feeling well, and notify your surgeon if you develop  symptoms. If you have been in contact with anyone that has tested positive in the last 10 days, please notify your surgeon.    Please read over the following fact sheets that you were given.

## 2022-09-01 ENCOUNTER — Encounter (HOSPITAL_COMMUNITY)
Admission: RE | Admit: 2022-09-01 | Discharge: 2022-09-01 | Disposition: A | Discharge status: Home / Self Care | Payer: Medicare Other | Source: Ambulatory Visit | Attending: Neurosurgery | Admitting: Neurosurgery

## 2022-09-01 ENCOUNTER — Other Ambulatory Visit: Payer: Self-pay

## 2022-09-01 ENCOUNTER — Encounter (HOSPITAL_COMMUNITY): Payer: Self-pay

## 2022-09-01 VITALS — BP 154/75 | HR 64 | Temp 98.1°F | Resp 18 | Ht 71.0 in | Wt 214.0 lb

## 2022-09-01 DIAGNOSIS — Z01812 Encounter for preprocedural laboratory examination: Secondary | ICD-10-CM | POA: Diagnosis not present

## 2022-09-01 DIAGNOSIS — E1169 Type 2 diabetes mellitus with other specified complication: Secondary | ICD-10-CM

## 2022-09-01 DIAGNOSIS — Z01818 Encounter for other preprocedural examination: Secondary | ICD-10-CM

## 2022-09-01 LAB — CBC
HCT: 37.4 % — ABNORMAL LOW (ref 39.0–52.0)
Hemoglobin: 12.6 g/dL — ABNORMAL LOW (ref 13.0–17.0)
MCH: 31 pg (ref 26.0–34.0)
MCHC: 33.7 g/dL (ref 30.0–36.0)
MCV: 92.1 fL (ref 80.0–100.0)
Platelets: 190 10*3/uL (ref 150–400)
RBC: 4.06 MIL/uL — ABNORMAL LOW (ref 4.22–5.81)
RDW: 14.4 % (ref 11.5–15.5)
WBC: 6.6 10*3/uL (ref 4.0–10.5)
nRBC: 0 % (ref 0.0–0.2)

## 2022-09-01 LAB — BASIC METABOLIC PANEL
Anion gap: 6 (ref 5–15)
BUN: 13 mg/dL (ref 8–23)
CO2: 25 mmol/L (ref 22–32)
Calcium: 9 mg/dL (ref 8.9–10.3)
Chloride: 105 mmol/L (ref 98–111)
Creatinine, Ser: 1.12 mg/dL (ref 0.61–1.24)
GFR, Estimated: >60 mL/min (ref >60)
Glucose, Bld: 170 mg/dL — ABNORMAL HIGH (ref 70–99)
Potassium: 4.2 mmol/L (ref 3.5–5.1)
Sodium: 136 mmol/L (ref 135–145)

## 2022-09-01 LAB — HEMOGLOBIN A1C
Hgb A1c MFr Bld: 7.1 % — ABNORMAL HIGH (ref 4.8–5.6)
Mean Plasma Glucose: 157.07 mg/dL

## 2022-09-01 LAB — SURGICAL PCR SCREEN
MRSA, PCR: NEGATIVE
Staphylococcus aureus: NEGATIVE

## 2022-09-01 LAB — GLUCOSE, CAPILLARY: Glucose-Capillary: 175 mg/dL — ABNORMAL HIGH (ref 70–99)

## 2022-09-01 NOTE — Progress Notes (Signed)
  PCP - Dr. Lajean Manes recently retired, will see Dr. Koleen Nimrod Cardiologist - Denies   PPM/ICD - Denies Device Orders - n/a Rep Notified - n/a   Chest x-ray - Denies EKG - 01/26/2022 Stress Test - per patient, 15+ years ago at another health system ECHO - denies Cardiac Cath - denies   Sleep Study - Yes. 10+ years ago CPAP - Per patient, uses every night. Not sure what his pressure settings are. Plans to bring CPAP DOS   Pt is is a Type 2 Diabetic. He does not own a CBG meter at home and does not check his blood sugars on a regular basis.  CBG 175 today   Blood Thinner Instructions: n/a Aspirin Instructions: LD 08/28/22, stopped per MD instructions   ERAS Protcol - NPO    COVID TEST- n/a    Patient denies shortness of breath, fever, cough and chest pain at PAT appointment     All instructions explained to the patient, with a verbal understanding of the material. Patient agrees to go over the instructions while at home for a better understanding.

## 2022-09-03 NOTE — Anesthesia Preprocedure Evaluation (Addendum)
Anesthesia Evaluation  Patient identified by MRN, date of birth, ID band Patient awake    Reviewed: Allergy & Precautions, NPO status , Patient's Chart, lab work & pertinent test results, reviewed documented beta blocker date and time   Airway Mallampati: III  TM Distance: >3 FB Neck ROM: Full    Dental  (+) Dental Advisory Given, Missing,    Pulmonary sleep apnea and Continuous Positive Airway Pressure Ventilation , former smoker   Pulmonary exam normal breath sounds clear to auscultation       Cardiovascular hypertension, Pt. on home beta blockers and Pt. on medications Normal cardiovascular exam Rhythm:Regular Rate:Normal     Neuro/Psych negative neurological ROS  negative psych ROS   GI/Hepatic Neg liver ROS,GERD  ,,  Endo/Other  diabetes, Type 2, Oral Hypoglycemic Agents    Renal/GU Renal InsufficiencyRenal disease  negative genitourinary   Musculoskeletal negative musculoskeletal ROS (+)    Abdominal   Peds  Hematology negative hematology ROS (+) H/o lymphoma   Anesthesia Other Findings   Reproductive/Obstetrics                             Anesthesia Physical Anesthesia Plan  ASA: 3  Anesthesia Plan: General   Post-op Pain Management: Tylenol PO (pre-op)* and Dilaudid IV   Induction: Intravenous  PONV Risk Score and Plan: 2 and Dexamethasone, Ondansetron and Treatment may vary due to age or medical condition  Airway Management Planned: Oral ETT  Additional Equipment:   Intra-op Plan:   Post-operative Plan: Extubation in OR  Informed Consent: I have reviewed the patients History and Physical, chart, labs and discussed the procedure including the risks, benefits and alternatives for the proposed anesthesia with the patient or authorized representative who has indicated his/her understanding and acceptance.     Dental advisory given  Plan Discussed with:  CRNA  Anesthesia Plan Comments:        Anesthesia Quick Evaluation

## 2022-09-04 ENCOUNTER — Other Ambulatory Visit: Payer: Self-pay

## 2022-09-04 ENCOUNTER — Ambulatory Visit (HOSPITAL_COMMUNITY)
Admission: RE | Disposition: A | Discharge status: Home / Self Care | Payer: Self-pay | Source: Home / Self Care | Attending: Neurosurgery

## 2022-09-04 ENCOUNTER — Ambulatory Visit (HOSPITAL_COMMUNITY): Payer: Medicare Other

## 2022-09-04 ENCOUNTER — Encounter (HOSPITAL_COMMUNITY): Payer: Self-pay | Admitting: Neurosurgery

## 2022-09-04 ENCOUNTER — Ambulatory Visit (HOSPITAL_BASED_OUTPATIENT_CLINIC_OR_DEPARTMENT_OTHER): Payer: Medicare Other | Admitting: Anesthesiology

## 2022-09-04 ENCOUNTER — Ambulatory Visit (HOSPITAL_COMMUNITY): Payer: Medicare Other | Admitting: Anesthesiology

## 2022-09-04 ENCOUNTER — Observation Stay (HOSPITAL_COMMUNITY)
Admission: RE | Admit: 2022-09-04 | Discharge: 2022-09-05 | Disposition: A | Discharge status: Home / Self Care | Payer: Medicare Other | Attending: Neurosurgery | Admitting: Neurosurgery

## 2022-09-04 DIAGNOSIS — M48061 Spinal stenosis, lumbar region without neurogenic claudication: Principal | ICD-10-CM | POA: Insufficient documentation

## 2022-09-04 DIAGNOSIS — Z8546 Personal history of malignant neoplasm of prostate: Secondary | ICD-10-CM | POA: Diagnosis not present

## 2022-09-04 DIAGNOSIS — Z79899 Other long term (current) drug therapy: Secondary | ICD-10-CM | POA: Diagnosis not present

## 2022-09-04 DIAGNOSIS — E119 Type 2 diabetes mellitus without complications: Secondary | ICD-10-CM | POA: Insufficient documentation

## 2022-09-04 DIAGNOSIS — M5416 Radiculopathy, lumbar region: Secondary | ICD-10-CM | POA: Insufficient documentation

## 2022-09-04 DIAGNOSIS — I10 Essential (primary) hypertension: Secondary | ICD-10-CM | POA: Diagnosis not present

## 2022-09-04 DIAGNOSIS — Z87891 Personal history of nicotine dependence: Secondary | ICD-10-CM | POA: Diagnosis not present

## 2022-09-04 DIAGNOSIS — Z7984 Long term (current) use of oral hypoglycemic drugs: Secondary | ICD-10-CM | POA: Diagnosis not present

## 2022-09-04 DIAGNOSIS — Z8572 Personal history of non-Hodgkin lymphomas: Secondary | ICD-10-CM | POA: Diagnosis not present

## 2022-09-04 DIAGNOSIS — Z7982 Long term (current) use of aspirin: Secondary | ICD-10-CM | POA: Insufficient documentation

## 2022-09-04 DIAGNOSIS — Z981 Arthrodesis status: Secondary | ICD-10-CM | POA: Diagnosis not present

## 2022-09-04 DIAGNOSIS — E1169 Type 2 diabetes mellitus with other specified complication: Secondary | ICD-10-CM

## 2022-09-04 HISTORY — PX: LUMBAR LAMINECTOMY/DECOMPRESSION MICRODISCECTOMY: SHX5026

## 2022-09-04 LAB — GLUCOSE, CAPILLARY
Glucose-Capillary: 151 mg/dL — ABNORMAL HIGH (ref 70–99)
Glucose-Capillary: 191 mg/dL — ABNORMAL HIGH (ref 70–99)
Glucose-Capillary: 299 mg/dL — ABNORMAL HIGH (ref 70–99)
Glucose-Capillary: 294 mg/dL — ABNORMAL HIGH (ref 70–99)

## 2022-09-04 SURGERY — LUMBAR LAMINECTOMY/DECOMPRESSION MICRODISCECTOMY 1 LEVEL
Anesthesia: General | Site: Back | Laterality: Left

## 2022-09-04 MED ORDER — BUPIVACAINE HCL (PF) 0.25 % IJ SOLN
INTRAMUSCULAR | Status: DC | PRN
  Administered 2022-09-04: 20 mL

## 2022-09-04 MED ORDER — PANTOPRAZOLE SODIUM 40 MG PO TBEC: 40.0000 mg | DELAYED_RELEASE_TABLET | Freq: Every day | ORAL | Status: DC | PRN

## 2022-09-04 MED ORDER — PROPOFOL 10 MG/ML IV BOLUS
INTRAVENOUS | Status: DC | PRN
  Administered 2022-09-04: 100 mg via INTRAVENOUS
  Administered 2022-09-04: 50 mg via INTRAVENOUS

## 2022-09-04 MED ORDER — ATENOLOL 25 MG PO TABS: 25.0000 mg | ORAL_TABLET | Freq: Every morning | ORAL | Status: DC

## 2022-09-04 MED ORDER — THROMBIN (RECOMBINANT) 5000 UNITS EX SOLR
CUTANEOUS | Status: DC | PRN
  Administered 2022-09-04: 10 mL via TOPICAL

## 2022-09-04 MED ORDER — HYDROMORPHONE HCL 1 MG/ML IJ SOLN: 1.0000 mg | INTRAMUSCULAR | Status: DC | PRN

## 2022-09-04 MED ORDER — ONDANSETRON HCL 4 MG/2ML IJ SOLN
INTRAMUSCULAR | Status: DC | PRN
  Administered 2022-09-04: 4 mg via INTRAVENOUS

## 2022-09-04 MED ORDER — ONDANSETRON HCL 4 MG/2ML IJ SOLN: 4.0000 mg | Freq: Four times a day (QID) | INTRAMUSCULAR | Status: DC | PRN

## 2022-09-04 MED ORDER — DEXAMETHASONE SODIUM PHOSPHATE 10 MG/ML IJ SOLN
INTRAMUSCULAR | Status: DC | PRN
  Administered 2022-09-04: 10 mg via INTRAVENOUS

## 2022-09-04 MED ORDER — CHLORHEXIDINE GLUCONATE CLOTH 2 % EX PADS: 6.0000 | MEDICATED_PAD | Freq: Once | CUTANEOUS | Status: DC

## 2022-09-04 MED ORDER — LIDOCAINE 2% (20 MG/ML) 5 ML SYRINGE
INTRAMUSCULAR | Status: DC | PRN
  Administered 2022-09-04: 60 mg via INTRAVENOUS

## 2022-09-04 MED ORDER — FENTANYL CITRATE (PF) 100 MCG/2ML IJ SOLN
25.0000 ug | INTRAMUSCULAR | Status: DC | PRN
  Administered 2022-09-04: 50 ug via INTRAVENOUS

## 2022-09-04 MED ORDER — INSULIN ASPART 100 UNIT/ML IJ SOLN
0.0000 [IU] | Freq: Every day | INTRAMUSCULAR | Status: DC
  Administered 2022-09-04: 3 [IU] via SUBCUTANEOUS

## 2022-09-04 MED ORDER — SALINE SPRAY 0.65 % NA SOLN: 1.0000 | NASAL | Status: DC | PRN

## 2022-09-04 MED ORDER — PHENYLEPHRINE HCL-NACL 20-0.9 MG/250ML-% IV SOLN
INTRAVENOUS | Status: AC
  Filled 2022-09-04: qty 250

## 2022-09-04 MED ORDER — PROPOFOL 500 MG/50ML IV EMUL
INTRAVENOUS | Status: DC | PRN
  Administered 2022-09-04: 100 ug/kg/min via INTRAVENOUS

## 2022-09-04 MED ORDER — HYDROCODONE-ACETAMINOPHEN 10-325 MG PO TABS
1.0000 | ORAL_TABLET | ORAL | Status: DC | PRN
  Administered 2022-09-04 – 2022-09-05 (×3): 1 via ORAL
  Filled 2022-09-04 (×3): qty 1

## 2022-09-04 MED ORDER — INSULIN ASPART 100 UNIT/ML IJ SOLN
0.0000 [IU] | Freq: Three times a day (TID) | INTRAMUSCULAR | Status: DC
  Administered 2022-09-04: 8 [IU] via SUBCUTANEOUS
  Administered 2022-09-05: 3 [IU] via SUBCUTANEOUS

## 2022-09-04 MED ORDER — PHENYLEPHRINE HCL-NACL 20-0.9 MG/250ML-% IV SOLN
INTRAVENOUS | Status: DC | PRN
  Administered 2022-09-04: 25 ug/min via INTRAVENOUS

## 2022-09-04 MED ORDER — ACETAMINOPHEN 650 MG RE SUPP: 650.0000 mg | RECTAL | Status: DC | PRN

## 2022-09-04 MED ORDER — SUGAMMADEX SODIUM 200 MG/2ML IV SOLN
INTRAVENOUS | Status: DC | PRN
  Administered 2022-09-04: 200 mg via INTRAVENOUS

## 2022-09-04 MED ORDER — ONDANSETRON HCL 4 MG PO TABS: 4.0000 mg | ORAL_TABLET | Freq: Four times a day (QID) | ORAL | Status: DC | PRN

## 2022-09-04 MED ORDER — INSULIN ASPART 100 UNIT/ML IJ SOLN: 0.0000 [IU] | INTRAMUSCULAR | Status: DC | PRN

## 2022-09-04 MED ORDER — LORATADINE 10 MG PO TABS: 10.0000 mg | ORAL_TABLET | Freq: Every day | ORAL | Status: DC

## 2022-09-04 MED ORDER — SODIUM CHLORIDE 0.9% FLUSH: 3.0000 mL | INTRAVENOUS | Status: DC | PRN

## 2022-09-04 MED ORDER — ROCURONIUM BROMIDE 10 MG/ML (PF) SYRINGE
PREFILLED_SYRINGE | INTRAVENOUS | Status: DC | PRN
  Administered 2022-09-04: 60 mg via INTRAVENOUS

## 2022-09-04 MED ORDER — ACETAMINOPHEN 325 MG PO TABS: 650.0000 mg | ORAL_TABLET | ORAL | Status: DC | PRN

## 2022-09-04 MED ORDER — OXYBUTYNIN CHLORIDE ER 5 MG PO TB24
5.0000 mg | ORAL_TABLET | Freq: Every day | ORAL | Status: DC
  Administered 2022-09-04: 5 mg via ORAL
  Filled 2022-09-04: qty 1

## 2022-09-04 MED ORDER — FENTANYL CITRATE (PF) 250 MCG/5ML IJ SOLN
INTRAMUSCULAR | Status: DC | PRN
  Administered 2022-09-04 (×2): 100 ug via INTRAVENOUS

## 2022-09-04 MED ORDER — KETOROLAC TROMETHAMINE 15 MG/ML IJ SOLN
7.5000 mg | Freq: Four times a day (QID) | INTRAMUSCULAR | Status: AC
  Administered 2022-09-04 – 2022-09-05 (×4): 7.5 mg via INTRAVENOUS
  Filled 2022-09-04 (×4): qty 1

## 2022-09-04 MED ORDER — PHENOL 1.4 % MT LIQD: 1.0000 | OROMUCOSAL | Status: DC | PRN

## 2022-09-04 MED ORDER — SODIUM CHLORIDE 0.9 % IV SOLN
250.0000 mL | INTRAVENOUS | Status: DC
  Administered 2022-09-04: 250 mL via INTRAVENOUS

## 2022-09-04 MED ORDER — DULOXETINE HCL 30 MG PO CPEP: 30.0000 mg | ORAL_CAPSULE | Freq: Every day | ORAL | Status: DC

## 2022-09-04 MED ORDER — AMLODIPINE BESYLATE 10 MG PO TABS: 10.0000 mg | ORAL_TABLET | Freq: Every morning | ORAL | Status: DC

## 2022-09-04 MED ORDER — ROSUVASTATIN CALCIUM 5 MG PO TABS
10.0000 mg | ORAL_TABLET | Freq: Every day | ORAL | Status: DC
  Administered 2022-09-04: 10 mg via ORAL
  Filled 2022-09-04: qty 2

## 2022-09-04 MED ORDER — KETAMINE HCL 50 MG/5ML IJ SOSY
PREFILLED_SYRINGE | INTRAMUSCULAR | Status: AC
  Filled 2022-09-04: qty 5

## 2022-09-04 MED ORDER — TIMOLOL MALEATE 0.5 % OP SOLN
1.0000 [drp] | Freq: Every morning | OPHTHALMIC | Status: DC
  Filled 2022-09-04: qty 5

## 2022-09-04 MED ORDER — ORAL CARE MOUTH RINSE: 15.0000 mL | Freq: Once | OROMUCOSAL | Status: AC

## 2022-09-04 MED ORDER — ACETAMINOPHEN 500 MG PO TABS
1000.0000 mg | ORAL_TABLET | Freq: Once | ORAL | Status: AC
  Administered 2022-09-04: 1000 mg via ORAL
  Filled 2022-09-04: qty 2

## 2022-09-04 MED ORDER — LISINOPRIL 20 MG PO TABS
20.0000 mg | ORAL_TABLET | Freq: Every morning | ORAL | Status: DC
  Administered 2022-09-04: 20 mg via ORAL
  Filled 2022-09-04: qty 1

## 2022-09-04 MED ORDER — PROPOFOL 10 MG/ML IV BOLUS
INTRAVENOUS | Status: AC
  Filled 2022-09-04: qty 20

## 2022-09-04 MED ORDER — CEFAZOLIN SODIUM-DEXTROSE 1-4 GM/50ML-% IV SOLN
1.0000 g | Freq: Three times a day (TID) | INTRAVENOUS | Status: AC
  Administered 2022-09-04 – 2022-09-05 (×2): 1 g via INTRAVENOUS
  Filled 2022-09-04 (×2): qty 50

## 2022-09-04 MED ORDER — KETAMINE HCL 10 MG/ML IJ SOLN
INTRAMUSCULAR | Status: DC | PRN
  Administered 2022-09-04: 30 mg via INTRAVENOUS

## 2022-09-04 MED ORDER — MELATONIN 3 MG PO TABS: 3.0000 mg | ORAL_TABLET | Freq: Every evening | ORAL | Status: DC | PRN

## 2022-09-04 MED ORDER — FENTANYL CITRATE (PF) 100 MCG/2ML IJ SOLN
INTRAMUSCULAR | Status: AC
  Filled 2022-09-04: qty 2

## 2022-09-04 MED ORDER — HYDROCODONE-ACETAMINOPHEN 5-325 MG PO TABS: 1.0000 | ORAL_TABLET | ORAL | Status: DC | PRN

## 2022-09-04 MED ORDER — CHLORHEXIDINE GLUCONATE 0.12 % MT SOLN
15.0000 mL | Freq: Once | OROMUCOSAL | Status: AC
  Administered 2022-09-04: 15 mL via OROMUCOSAL
  Filled 2022-09-04: qty 15

## 2022-09-04 MED ORDER — CEFAZOLIN SODIUM-DEXTROSE 2-4 GM/100ML-% IV SOLN
2.0000 g | INTRAVENOUS | Status: AC
  Administered 2022-09-04: 2 g via INTRAVENOUS
  Filled 2022-09-04: qty 100

## 2022-09-04 MED ORDER — LACTATED RINGERS IV SOLN: INTRAVENOUS | Status: DC

## 2022-09-04 MED ORDER — ASPIRIN 81 MG PO TBEC
81.0000 mg | DELAYED_RELEASE_TABLET | Freq: Every day | ORAL | Status: DC
  Administered 2022-09-04: 81 mg via ORAL
  Filled 2022-09-04: qty 1

## 2022-09-04 MED ORDER — INSULIN ASPART 100 UNIT/ML IJ SOLN: 0.0000 [IU] | Freq: Three times a day (TID) | INTRAMUSCULAR | Status: DC

## 2022-09-04 MED ORDER — SODIUM CHLORIDE 0.9% FLUSH: 3.0000 mL | Freq: Two times a day (BID) | INTRAVENOUS | Status: DC

## 2022-09-04 MED ORDER — 0.9 % SODIUM CHLORIDE (POUR BTL) OPTIME
TOPICAL | Status: DC | PRN
  Administered 2022-09-04: 1000 mL

## 2022-09-04 MED ORDER — SPIRONOLACTONE 25 MG PO TABS
25.0000 mg | ORAL_TABLET | Freq: Every day | ORAL | Status: DC
  Administered 2022-09-04: 25 mg via ORAL
  Filled 2022-09-04: qty 1

## 2022-09-04 MED ORDER — CYCLOBENZAPRINE HCL 10 MG PO TABS: 10.0000 mg | ORAL_TABLET | Freq: Three times a day (TID) | ORAL | Status: DC | PRN

## 2022-09-04 MED ORDER — MENTHOL 3 MG MT LOZG: 1.0000 | LOZENGE | OROMUCOSAL | Status: DC | PRN

## 2022-09-04 MED ORDER — THROMBIN 5000 UNITS EX SOLR
CUTANEOUS | Status: AC
  Filled 2022-09-04: qty 10000

## 2022-09-04 MED ORDER — METFORMIN HCL 500 MG PO TABS
1000.0000 mg | ORAL_TABLET | Freq: Every day | ORAL | Status: DC
  Administered 2022-09-04 – 2022-09-05 (×2): 1000 mg via ORAL
  Filled 2022-09-04 (×2): qty 2

## 2022-09-04 MED ORDER — FENTANYL CITRATE (PF) 250 MCG/5ML IJ SOLN
INTRAMUSCULAR | Status: AC
  Filled 2022-09-04: qty 5

## 2022-09-04 MED ORDER — BUPIVACAINE HCL (PF) 0.25 % IJ SOLN
INTRAMUSCULAR | Status: AC
  Filled 2022-09-04: qty 30

## 2022-09-04 SURGICAL SUPPLY — 47 items
ADH SKN CLS APL DERMABOND .7 (GAUZE/BANDAGES/DRESSINGS) ×1
APL SKNCLS STERI-STRIP NONHPOA (GAUZE/BANDAGES/DRESSINGS) ×1
BAG COUNTER SPONGE SURGICOUNT (BAG) ×2 IMPLANT
BAG DECANTER FOR FLEXI CONT (MISCELLANEOUS) ×2 IMPLANT
BAG SPNG CNTER NS LX DISP (BAG) ×1
BAND INSRT 18 STRL LF DISP RB (MISCELLANEOUS) ×2
BAND RUBBER #18 3X1/16 STRL (MISCELLANEOUS) ×4 IMPLANT
BENZOIN TINCTURE PRP APPL 2/3 (GAUZE/BANDAGES/DRESSINGS) ×2 IMPLANT
BLADE CLIPPER SURG (BLADE) IMPLANT
BUR CUTTER 7.0 ROUND (BURR) ×2 IMPLANT
CANISTER SUCT 3000ML PPV (MISCELLANEOUS) ×2 IMPLANT
DERMABOND ADVANCED .7 DNX12 (GAUZE/BANDAGES/DRESSINGS) ×2 IMPLANT
DRAPE HALF SHEET 40X57 (DRAPES) IMPLANT
DRAPE LAPAROTOMY 100X72X124 (DRAPES) ×2 IMPLANT
DRAPE MICROSCOPE SLANT 54X150 (MISCELLANEOUS) ×2 IMPLANT
DRAPE SURG 17X23 STRL (DRAPES) ×4 IMPLANT
DRSG OPSITE POSTOP 4X6 (GAUZE/BANDAGES/DRESSINGS) IMPLANT
DURAPREP 26ML APPLICATOR (WOUND CARE) ×2 IMPLANT
ELECT REM PT RETURN 9FT ADLT (ELECTROSURGICAL) ×1
ELECTRODE REM PT RTRN 9FT ADLT (ELECTROSURGICAL) ×2 IMPLANT
GAUZE 4X4 16PLY ~~LOC~~+RFID DBL (SPONGE) IMPLANT
GAUZE SPONGE 4X4 12PLY STRL (GAUZE/BANDAGES/DRESSINGS) ×2 IMPLANT
GLOVE BIO SURGEON STRL SZ 6.5 (GLOVE) ×2 IMPLANT
GLOVE BIOGEL PI IND STRL 6.5 (GLOVE) ×2 IMPLANT
GLOVE ECLIPSE 9.0 STRL (GLOVE) ×2 IMPLANT
GLOVE EXAM NITRILE XL STR (GLOVE) IMPLANT
GOWN STRL REUS W/ TWL LRG LVL3 (GOWN DISPOSABLE) IMPLANT
GOWN STRL REUS W/ TWL XL LVL3 (GOWN DISPOSABLE) ×2 IMPLANT
GOWN STRL REUS W/TWL 2XL LVL3 (GOWN DISPOSABLE) IMPLANT
GOWN STRL REUS W/TWL LRG LVL3 (GOWN DISPOSABLE) ×3
GOWN STRL REUS W/TWL XL LVL3 (GOWN DISPOSABLE) ×1
KIT BASIN OR (CUSTOM PROCEDURE TRAY) ×2 IMPLANT
KIT TURNOVER KIT B (KITS) ×2 IMPLANT
NDL SPNL 22GX3.5 QUINCKE BK (NEEDLE) IMPLANT
NEEDLE HYPO 22GX1.5 SAFETY (NEEDLE) ×2 IMPLANT
NEEDLE SPNL 22GX3.5 QUINCKE BK (NEEDLE) IMPLANT
NS IRRIG 1000ML POUR BTL (IV SOLUTION) ×2 IMPLANT
PACK LAMINECTOMY NEURO (CUSTOM PROCEDURE TRAY) ×2 IMPLANT
PAD ARMBOARD 7.5X6 YLW CONV (MISCELLANEOUS) ×6 IMPLANT
SPIKE FLUID TRANSFER (MISCELLANEOUS) ×2 IMPLANT
SPONGE SURGIFOAM ABS GEL SZ50 (HEMOSTASIS) ×2 IMPLANT
STRIP CLOSURE SKIN 1/2X4 (GAUZE/BANDAGES/DRESSINGS) ×2 IMPLANT
SUT VIC AB 2-0 CT1 18 (SUTURE) ×2 IMPLANT
SUT VIC AB 3-0 SH 8-18 (SUTURE) ×2 IMPLANT
TOWEL GREEN STERILE (TOWEL DISPOSABLE) ×2 IMPLANT
TOWEL GREEN STERILE FF (TOWEL DISPOSABLE) ×2 IMPLANT
WATER STERILE IRR 1000ML POUR (IV SOLUTION) ×2 IMPLANT

## 2022-09-04 NOTE — Anesthesia Postprocedure Evaluation (Signed)
Anesthesia Post Note  Patient: [name]  Procedure(s) Performed: Left Lumbar Four-Five Extraforaminal Decompression (Left: Back)     Patient location during evaluation: PACU Anesthesia Type: General Level of consciousness: awake and alert Pain management: pain level controlled Vital Signs Assessment: post-procedure vital signs reviewed and stable Respiratory status: spontaneous breathing, nonlabored ventilation, respiratory function stable and patient connected to nasal cannula oxygen Cardiovascular status: blood pressure returned to baseline and stable Postop Assessment: no apparent nausea or vomiting Anesthetic complications: no  No notable events documented.  Last Vitals:  Vitals:   09/04/22 1208 09/04/22 1229  BP: (!) 151/79 (!) 141/76  Pulse: (!) 57 (!) 48  Resp: 13   Temp: 36.5 C 36.6 C  SpO2: 94% 97%    Last Pain:  Vitals:   09/04/22 1229  TempSrc: Oral  PainSc:                  Camara Rosander L Gargi Berch

## 2022-09-04 NOTE — Anesthesia Procedure Notes (Signed)
Procedure Name: Intubation Date/Time: 09/04/2022 9:51 AM  Performed by: Georgia Duff, CRNAPre-anesthesia Checklist: Patient identified, Emergency Drugs available, Suction available and Patient being monitored Patient Re-evaluated:Patient Re-evaluated prior to induction Oxygen Delivery Method: Circle System Utilized Preoxygenation: Pre-oxygenation with 100% oxygen Induction Type: IV induction Ventilation: Mask ventilation without difficulty Laryngoscope Size: Mac and 4 Grade View: Grade II Tube type: Oral Tube size: 7.5 mm Number of attempts: 1 Airway Equipment and Method: Stylet and Oral airway Placement Confirmation: ETT inserted through vocal cords under direct vision, positive ETCO2 and breath sounds checked- equal and bilateral Secured at: 22 cm Tube secured with: Tape Dental Injury: Teeth and Oropharynx as per pre-operative assessment

## 2022-09-04 NOTE — Op Note (Signed)
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  Asst. Surgeon: Christella Noa, MD, Reinaldo Meeker, NP  Anesthesia: General  Indication: 82 year old male status post L3-4 and L4-5 decompressive surgery with good relief of most of his primary symptoms however the patient has some new left-sided L4 radicular symptoms which have not responded to conservative management or injection management.  Patient with significant foraminal stenosis particularly laterally at L4-5.  Patient presents now for L4-5 decompressive foraminotomy in hopes of improving his symptoms.  Operative note: After induction of anesthesia, patient positioned prone on the Wilson frame and appropriately padded.  Lumbar region prepped and draped sterilely.  Incision made overlying L4-5.  Dissection performed on the left.  Retractor placed.  X-ray taken.  Level confirmed.  Extraforaminal approach was then performed using high-speed drill and Kerrison rongeurs to remove the superior aspect of the superior articular process of L5.  Intertransverse ligament was elevated and resected.  The lateral aspect of the pars interarticularis was also resected.  Epidural fibrotic tissue was dissected free overlying the left L4 nerve root.  Microscope brought in field used microdissection the spinal canal.  The left L4 nerve root was identified.  It was mobilized in the disc below it was visualized and found to be free of any herniation or obvious tear.  The lateral foramen was further opened using Kerrison rongeurs and curettes to remove overgrown soft tissue.  At this point a very thorough decompression had been achieved.  There was no evidence of injury to the thecal sac and nerve roots.  Wound was then  irrigated with antibiotic solution.  Gelfoam was placed topically for hemostasis.  Wound is then closed in layers with Vicryl sutures.  Steri-Strips and sterile dressing were applied.  No apparent complications.  Patient tolerated the procedure well and he returns to the recovery room postop.

## 2022-09-04 NOTE — Transfer of Care (Signed)
Immediate Anesthesia Transfer of Care Note  Patient: STANISLAUS BODKINS  Procedure(s) Performed: Left Lumbar Four-Five Extraforaminal Decompression (Left: Back)  Patient Location: PACU  Anesthesia Type:General  Level of Consciousness: drowsy and patient cooperative  Airway & Oxygen Therapy: Patient Spontanous Breathing and Patient connected to nasal cannula oxygen  Post-op Assessment: Report given to RN and Post -op Vital signs reviewed and stable  Post vital signs: Reviewed and stable  Last Vitals:  Vitals Value Taken Time  BP    Temp    Pulse 54 09/04/22 1125  Resp 13 09/04/22 1125  SpO2 94 % 09/04/22 1125  Vitals shown include unvalidated device data.  Last Pain:  Vitals:   09/04/22 0748  TempSrc:   PainSc: 5       Patients Stated Pain Goal: 0 ([id number])  Complications: No notable events documented.

## 2022-09-04 NOTE — H&P (Signed)
[name] is an 82 y.o. male.   Chief Complaint: Left leg pain. HPI: 82 year old male status post L3-4 and L4-5 decompressive surgery presents with recurrent left lower extremity radicular pain consistent with a left-sided L4 radiculopathy which is failed conservative management.  Workup demonstrates evidence of some lateral foraminal stenosis at L4-5 with ongoing compression of the left L4 nerve root.  Patient has failed conservative management and presents now for left-sided L4-5 extraforaminal decompression in hopes of improving his symptoms.  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   Cancer ([place]) 2010   prostate   Deviated septum    chronic sinusitis, nasal turbinate hypertrophy   Diabetes mellitus without complication ([place])    on metformin   GERD (gastroesophageal reflux disease)    Hypertension    Pneumonia    Sleep apnea    wears CPAP   Wears glasses     Past Surgical History:  Procedure Laterality Date   back injection     to lower back   BACK SURGERY     lower back   EYE SURGERY Bilateral 2019   cataract   KNEE ARTHROSCOPY Right 03/27/2015   Procedure: RIGHT ARTHROSCOPY KNEE WITH DEBRIDEMENT, PARTIAL LATERAL MENISCECTOMY;  Surgeon: Gaynelle Arabian, MD;  Location: [facility];  Service: Orthopedics;  Laterality: Right;   LUMBAR LAMINECTOMY/DECOMPRESSION MICRODISCECTOMY Bilateral 01/27/2022   Procedure: Laminectomy and Foraminotomy - Bilateral - Lumbar Three-Four/Lumbar Four-Five;  Surgeon: Earnie Larsson, MD;  Location: [place];  Service: Neurosurgery;  Laterality: Bilateral;  3C   NASAL SEPTOPLASTY W/ TURBINOPLASTY Bilateral 04/01/2016   Procedure: NASAL SEPTOPLASTY WITH BILATERAL INFERIOR  TURBINATE REDUCTION;  Surgeon: Jerrell Belfast, MD;  Location: [facility] OR;  Service: ENT;  Laterality: Bilateral;   PROSTATECTOMY  07/20/2008   SINUS ENDO WITH FUSION Right  04/01/2016   Procedure: RIGHT ENDOSCOPIC SINUS SURGERY,AND CONSISTING  OF ANTERIOR ETHMOIDECTOMY;  Surgeon: Jerrell Belfast, MD;  Location: [facility] OR;  Service: ENT;  Laterality: Right;   surgery for collapsed lung Left 35 to 40 years ago    Family History  Problem Relation Age of Onset   Heart attack Father    Heart attack Sister    Social History:  reports that he quit smoking about 34 years ago. His smoking use included cigarettes. He has a 20.00 pack-year smoking history. He has never used smokeless tobacco. He reports that he does not drink alcohol and does not use drugs.  Allergies:  Allergies  Allergen Reactions   Atorvastatin     Other reaction(s): thrombocytopenia   Tizanidine Hcl     Other reaction(s): dizziness    Medications Prior to Admission  Medication Sig Dispense Refill   amLODipine (NORVASC) 10 MG tablet Take 10 mg by mouth every morning.      aspirin EC 81 MG tablet Take 81 mg by mouth daily. Swallow whole.     atenolol (TENORMIN) 50 MG tablet Take 25 mg by mouth every morning.     cetirizine (ZYRTEC) 10 MG tablet Take 10 mg by mouth daily as needed for allergies.     DULoxetine (CYMBALTA) 30 MG capsule TAKE 1 CAPSULE BY MOUTH EVERY DAY 90 capsule 4   lisinopril (PRINIVIL,ZESTRIL) 20 MG tablet Take 20 mg by mouth every morning.      Melatonin 3 MG TABS Take 3 mg by mouth at bedtime as  needed (sleep).     metFORMIN (GLUCOPHAGE) 1000 MG tablet Take 1,000 mg by mouth daily.     NON FORMULARY Pt uses a cpap nightly     oxybutynin (DITROPAN-XL) 5 MG 24 hr tablet Take 1 tablet (5 mg total) by mouth at bedtime. 30 tablet 6   pantoprazole (PROTONIX) 40 MG tablet Take 40 mg by mouth daily as needed (heartburn).  6   rosuvastatin (CRESTOR) 10 MG tablet Take 10 mg by mouth at bedtime.     sodium chloride (OCEAN) 0.65 % SOLN nasal spray Place 1 spray into both nostrils as needed for congestion.     spironolactone (ALDACTONE) 25 MG tablet Take 25 mg by mouth daily.     timolol  (TIMOPTIC) 0.5 % ophthalmic solution Place 1 drop into both eyes every morning.     HYDROcodone-acetaminophen (NORCO/VICODIN) 5-325 MG tablet Take 1 tablet by mouth every 4 (four) hours as needed for moderate pain ((score 4 to 6)). 30 tablet 0    Results for orders placed or performed during the hospital encounter of 09/04/22 (from the past 48 hour(s))  Glucose, capillary     Status: Abnormal   Collection Time: 09/04/22  7:41 AM  Result Value Ref Range   Glucose-Capillary 151 (H) 70 - 99 mg/dL    Comment: Glucose reference range applies only to samples taken after fasting for at least 8 hours.   No results found.  Pertinent items noted in HPI and remainder of comprehensive ROS otherwise negative.  Blood pressure (!) 151/72, pulse 60, temperature 98.7 F (37.1 C), temperature source Oral, resp. rate 20, height 5' 11"$  (1.803 m), weight 98 kg, SpO2 98 %.  Patient is awake and alert.  He is oriented and appropriate.  Speech is fluent.  Judgment insight are intact.  Cranial nerve function normal bilateral motor examination reveals intact motor strength bilaterally except he has some mild weakness of his left quadriceps muscle group.  Sensory examination with decrease sensation pinprick light touch in his left L4 dermatome.  Deep tendon reflexes normal active symptoms Achilles reflexes absent.  Gait antalgic.  Posture normal.  Examination head ears eyes nose and throat is unremarked.  Chest and abdomen are benign. Assessment/Plan Left L3-4 foraminal stenosis.  Plan left L3-4 lateral foraminotomies.  Some benefits been explained.  Patient wishes to proceed.  Cooper Render Ebonie Westerlund 09/04/2022, 9:17 AM

## 2022-09-04 NOTE — Brief Op Note (Signed)
09/04/2022  11:14 AM  PATIENT:  [name]  82 y.o. male  PRE-OPERATIVE DIAGNOSIS:  Stenosis  POST-OPERATIVE DIAGNOSIS:  Stenosis  PROCEDURE:  Procedure(s) with comments: Left Lumbar Four-Five Extraforaminal Decompression (Left) - 3C  SURGEON:  Surgeon(s) and Role:    Earnie Larsson, MD - Primary  PHYSICIAN ASSISTANT:   ASSISTANTS: Kyla Balzarine   ANESTHESIA:   general  EBL:  40cc   BLOOD ADMINISTERED:none  DRAINS: none   LOCAL MEDICATIONS USED:  MARCAINE     SPECIMEN:  No Specimen  DISPOSITION OF SPECIMEN:  N/A  COUNTS:  YES  TOURNIQUET:  * No tourniquets in log *  DICTATION: .Dragon Dictation  PLAN OF CARE: Admit for overnight observation  PATIENT DISPOSITION:  PACU - hemodynamically stable.   Delay start of Pharmacological VTE agent (>24hrs) due to surgical blood loss or risk of bleeding: yes

## 2022-09-05 ENCOUNTER — Encounter (HOSPITAL_COMMUNITY): Payer: Self-pay | Admitting: Neurosurgery

## 2022-09-05 DIAGNOSIS — Z8572 Personal history of non-Hodgkin lymphomas: Secondary | ICD-10-CM | POA: Diagnosis not present

## 2022-09-05 DIAGNOSIS — M48061 Spinal stenosis, lumbar region without neurogenic claudication: Secondary | ICD-10-CM | POA: Diagnosis not present

## 2022-09-05 DIAGNOSIS — E119 Type 2 diabetes mellitus without complications: Secondary | ICD-10-CM | POA: Diagnosis not present

## 2022-09-05 DIAGNOSIS — I10 Essential (primary) hypertension: Secondary | ICD-10-CM | POA: Diagnosis not present

## 2022-09-05 DIAGNOSIS — Z8546 Personal history of malignant neoplasm of prostate: Secondary | ICD-10-CM | POA: Diagnosis not present

## 2022-09-05 DIAGNOSIS — M5416 Radiculopathy, lumbar region: Secondary | ICD-10-CM | POA: Diagnosis not present

## 2022-09-05 LAB — GLUCOSE, CAPILLARY: Glucose-Capillary: 157 mg/dL — ABNORMAL HIGH (ref 70–99)

## 2022-09-05 MED ORDER — HYDROCODONE-ACETAMINOPHEN 5-325 MG PO TABS: 1.0000 | ORAL_TABLET | ORAL | 0 refills | Status: AC | PRN

## 2022-09-05 NOTE — Discharge Instructions (Signed)
Wound Care Keep incision covered and dry for three days.  .  Do not put any creams, lotions, or ointments on incision. Leave steri-strips on back.  They will fall off by themselves. Activity Walk each and every day, increasing distance each day. No lifting greater than 5 lbs.  Avoid excessive neck motion. No driving for 2 weeks; may ride as a passenger locally.  Diet Resume your normal diet.   Call Your Doctor If Any of These Occur Redness, drainage, or swelling at the wound.  Temperature greater than 101 degrees. Severe pain not relieved by pain medication. Incision starts to come apart. Follow Up Appt Call [phone])  for problems.  If you have any hardware placed in your spine, you will need an x-ray before your appointment.

## 2022-09-05 NOTE — Care Management (Signed)
Patient with order to DC to home today. Unit staff to provide DME needed for home.   No HH needs identified   Patient will have family/ friends provide transportation home. No other TOC needs identified for DC

## 2022-09-05 NOTE — Plan of Care (Signed)
  Problem: Education: Goal: Ability to verbalize activity precautions or restrictions will improve Outcome: Completed/Met Goal: Knowledge of the prescribed therapeutic regimen will improve Outcome: Completed/Met Goal: Understanding of discharge needs will improve Outcome: Completed/Met   Problem: Activity: Goal: Ability to avoid complications of mobility impairment will improve Outcome: Completed/Met Goal: Ability to tolerate increased activity will improve Outcome: Completed/Met Goal: Will remain free from falls Outcome: Completed/Met   Problem: Bowel/Gastric: Goal: Gastrointestinal status for postoperative course will improve Outcome: Completed/Met   Problem: Clinical Measurements: Goal: Ability to maintain clinical measurements within normal limits will improve Outcome: Completed/Met Goal: Postoperative complications will be avoided or minimized Outcome: Completed/Met Goal: Diagnostic test results will improve Outcome: Completed/Met   Problem: Pain Management: Goal: Pain level will decrease Outcome: Completed/Met   Problem: Skin Integrity: Goal: Will show signs of wound healing Outcome: Completed/Met   Problem: Health Behavior/Discharge Planning: Goal: Identification of resources available to assist in meeting health care needs will improve Outcome: Completed/Met   Problem: Bladder/Genitourinary: Goal: Urinary functional status for postoperative course will improve Outcome: Completed/Met  Patient alert and oriented, void, VSS, ambulate. Surgical site clean and dry. D/c instructions explain and given all questions answered. Pt. D/c home per order.

## 2022-09-05 NOTE — Evaluation (Signed)
Occupational Therapy Evaluation Patient Details Name: [name] MRN: [id number] DOB: 1941/03/15 Today's Date: 09/05/2022   History of Present Illness 82 y.o. M admitted on 2/16 s/p L4-5 lateral discectomy. PMH significant for arthritis, Cancer, DM2, GERD, HTN.   Clinical Impression   Patient admitted for procedure listed above. PTA pt reported that he was independent with all ADL's and IADL's. At this time pt presenting with mild pain. OT provided education on spinal precautions and he was able to demonstrate good follow through. Overall, pt is presenting at a mod I level with ADL's and requiring minimal cuing for recall with precautions. Pt has no further skilled OT needs and acute OT will sign off.       Recommendations for follow up therapy are one component of a multi-disciplinary discharge planning process, led by the attending physician.  Recommendations may be updated based on patient status, additional functional criteria and insurance authorization.   Follow Up Recommendations  No OT follow up     Assistance Recommended at Discharge PRN  Patient can return home with the following A little help with bathing/dressing/bathroom;Assistance with cooking/housework;Help with stairs or ramp for entrance    Functional Status Assessment  Patient has had a recent decline in their functional status and demonstrates the ability to make significant improvements in function in a reasonable and predictable amount of time.  Equipment Recommendations  None recommended by OT    Recommendations for Other Services       Precautions / Restrictions Precautions Precautions: Fall Restrictions Weight Bearing Restrictions: No      Mobility Bed Mobility Overal bed mobility: Modified Independent             General bed mobility comments: Following log roll technique from flat bed, no concerns.    Transfers Overall transfer level: Modified independent Equipment used: None                General transfer comment: Sit to stand and ambulate to bathroom, no difficulties      Balance Overall balance assessment: Mild deficits observed, not formally tested                                         ADL either performed or assessed with clinical judgement   ADL Overall ADL's : Modified independent                                       General ADL Comments: Following spinal precautions well, no concerns during ADL's or IADL's.     Vision Baseline Vision/History: 1 Wears glasses Ability to See in Adequate Light: 0 Adequate Patient Visual Report: No change from baseline Vision Assessment?: No apparent visual deficits     Perception Perception Perception Tested?: No   Praxis Praxis Praxis tested?: Not tested    Pertinent Vitals/Pain Pain Assessment Pain Assessment: 0-10 Pain Score: 2  Pain Location: Low back Pain Descriptors / Indicators: Aching, Discomfort, Grimacing Pain Intervention(s): Limited activity within patient's tolerance, Monitored during session, Repositioned     Hand Dominance Right   Extremity/Trunk Assessment Upper Extremity Assessment Upper Extremity Assessment: Overall WFL for tasks assessed   Lower Extremity Assessment Lower Extremity Assessment: Overall WFL for tasks assessed   Cervical / Trunk Assessment Cervical / Trunk Assessment: Back Surgery   Communication Communication  Communication: No difficulties   Cognition Arousal/Alertness: Awake/alert Behavior During Therapy: WFL for tasks assessed/performed Overall Cognitive Status: Within Functional Limits for tasks assessed                                       General Comments  VSS on RA    Exercises     Shoulder Instructions      Home Living Family/patient expects to be discharged to:: Private residence Living Arrangements: Spouse/significant other Available Help at Discharge: Family Type of Home:  House Home Access: Stairs to enter Technical brewer of Steps: 3 Entrance Stairs-Rails: Right Home Layout: Two level;Able to live on main level with bedroom/bathroom     Bathroom Shower/Tub: Occupational psychologist: Handicapped height Bathroom Accessibility: Yes How Accessible: Accessible via walker Home Equipment: [place] - single point;Rolling Walker (2 wheels)          Prior Functioning/Environment Prior Level of Function : Independent/Modified Independent             Mobility Comments: uses cane ADLs Comments: ind with ADLs        OT Problem List: Decreased strength;Decreased activity tolerance;Decreased knowledge of precautions      OT Treatment/Interventions:      OT Goals(Current goals can be found in the care plan section) Acute Rehab OT Goals Patient Stated Goal: To go home OT Goal Formulation: With patient Time For Goal Achievement: 09/05/22 Potential to Achieve Goals: Good  OT Frequency:      Co-evaluation              AM-PAC OT "6 Clicks" Daily Activity     Outcome Measure Help from another person eating meals?: None Help from another person taking care of personal grooming?: None Help from another person toileting, which includes using toliet, bedpan, or urinal?: None Help from another person bathing (including washing, rinsing, drying)?: None Help from another person to put on and taking off regular upper body clothing?: None Help from another person to put on and taking off regular lower body clothing?: None 6 Click Score: 24   End of Session Nurse Communication: Mobility status  Activity Tolerance: Patient tolerated treatment well Patient left: in bed;with call bell/phone within reach;with family/visitor present  OT Visit Diagnosis: Unsteadiness on feet (R26.81);Other abnormalities of gait and mobility (R26.89);Muscle weakness (generalized) (M62.81)                Time: [id number] OT Time Calculation (min): 29 min Charges:   OT General Charges $OT Visit: 1 Visit OT Evaluation $OT Eval Moderate Complexity: 1 Mod OT Treatments $Self Care/Home Management : 8-22 mins  Paulita Fujita, OTR/L [facility]  Brenetta Penny Elane Yolanda Bonine 09/05/2022, 8:51 AM

## 2022-09-05 NOTE — Progress Notes (Signed)
Neurosurgery Service Progress Note  Subjective: No acute events overnight, radicular pain resolved post-op, he's very pleased   Objective: Vitals:   09/04/22 1535 09/04/22 2051 09/05/22 0009 09/05/22 0550  BP: (!) 146/84 (!) 146/76 124/71 (!) 152/76  Pulse: (!) 59 69 67 62  Resp: 16 20 20 20  $ Temp: 97.7 F (36.5 C) 98 F (36.7 C) 98.5 F (36.9 C) 98 F (36.7 C)  TempSrc: Oral Oral Oral Oral  SpO2: 99% 98% 96% 98%  Weight:      Height:        Physical Exam: Strength 5/5 x4, incision c/d/i  Assessment & Plan: 82 y.o. man s/p far lateral 4-5 discectomy, recovering well.  -discharge home today  Judith Part  09/05/22 7:38 AM

## 2022-09-05 NOTE — Discharge Summary (Signed)
Discharge Summary  Date of Admission: 09/04/2022  Date of Discharge: 09/05/22  Attending Physician: Earnie Larsson, MD  Hospital Course: Patient was admitted following an uncomplicated [id number] far lateral discectomy. They were recovered in PACU and transferred to [facility]. Their preop symptoms were improved, their hospital course was uncomplicated and the patient was discharged home. They will follow up in clinic with me in clinic in 2 weeks.  Neurologic exam at discharge:  Strength 5/5 x4   Discharge diagnosis: Lumbar radiculopathy  Judith Part, MD 09/05/22 7:39 AM

## 2022-09-07 MED FILL — Thrombin For Soln 5000 Unit: CUTANEOUS | Qty: 2 | Status: AC

## 2022-09-17 ENCOUNTER — Encounter: Payer: Self-pay | Admitting: Neurology

## 2022-09-17 ENCOUNTER — Ambulatory Visit (INDEPENDENT_AMBULATORY_CARE_PROVIDER_SITE_OTHER): Payer: Medicare Other | Admitting: Neurology

## 2022-09-17 ENCOUNTER — Telehealth: Payer: Self-pay | Admitting: Neurology

## 2022-09-17 VITALS — BP 143/70 | HR 55 | Ht 71.0 in | Wt 219.5 lb

## 2022-09-17 DIAGNOSIS — R269 Unspecified abnormalities of gait and mobility: Secondary | ICD-10-CM

## 2022-09-17 DIAGNOSIS — R9089 Other abnormal findings on diagnostic imaging of central nervous system: Secondary | ICD-10-CM | POA: Diagnosis not present

## 2022-09-17 DIAGNOSIS — M4802 Spinal stenosis, cervical region: Secondary | ICD-10-CM | POA: Diagnosis not present

## 2022-09-17 DIAGNOSIS — C61 Malignant neoplasm of prostate: Secondary | ICD-10-CM

## 2022-09-17 DIAGNOSIS — M47816 Spondylosis without myelopathy or radiculopathy, lumbar region: Secondary | ICD-10-CM

## 2022-09-17 DIAGNOSIS — G3184 Mild cognitive impairment, so stated: Secondary | ICD-10-CM

## 2022-09-17 MED ORDER — OXYBUTYNIN CHLORIDE ER 10 MG PO TB24: 10.0000 mg | ORAL_TABLET | Freq: Every day | ORAL | 5 refills | Status: DC

## 2022-09-17 NOTE — Progress Notes (Signed)
Chief Complaint  Patient presents with   Procedure    Rm 12. Alone. Pt had back surgery on 09/04/22.    ASSESSMENT AND PLAN  [name] is a 82 y.o. male    1.  Status post lumbar decompression surgery July 2023, Feb 2024 -Improvement with second surgery, pain is less, walking is steadier  2.  Cognitive impairment -Improved, MOCA 26/30, here alone today, appears very knowledgeable -Repeat MRI of the brain for comparison, MRI of the brain in September 2023, generalized atrophy, ventriculomegaly, seems to be out of proportion to the extent of cortical atrophy -Cognitive impairment, gait abnormalities, urinary incontinence have occurred gradually.  Since no acute onset, not especially suggestive of normal pressure hydrocephalus  3.  Gait abnormality -MRI cervical spine October 2023 at C6-7 moderate spinal stenosis, varying degree of foraminal narrowing, he denies neck pain -Gait improving since second lumbar surgery   4.  Nocturia, urinary incontinence, history of prostate cancer, prostatectomy -Increase oxybutynin ER 10 mg at bedtime, referral to urology, again as mentioned repeat MRI of the brain for comparison  DIAGNOSTIC DATA (LABS, IMAGING, TESTING) - I reviewed patient records, labs, notes, testing and imaging myself where available.  Laboratory evaluation February 2023, triglyceride 124, LDL 73 A1c 7.2, normal CMP, CBC with hemoglobin of 13.0, A1c 7.2,  MEDICAL HISTORY:  [name], is a 82 year old male seen in request by his primary care physician Dr. Felipa Eth, Christiane Ha, for evaluation of hand tremor, he is accompanied by his wife at today's visit on November 05, 2021  I reviewed and summarized the referring note. PMHX. 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  He has long history of chronic low back pain, has lumbar decompression surgery in the past, which  did help his symptoms some,  He began to noticed worsening low back pain since January 2023, radiating down to bilateral buttock, and posterior thigh, getting worse with change positions such as getting up and down  He has been under pain management Dr. 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 Deri Fuelling January 27, 2022  This has helped his low back pain, while in the rehab he was doing very well, able to ambulate much better  But since discharge, wife reported that he continued to regress, tends to sit, feels frustrated easily, rely on his walker sometimes, bending over, he has not started home physical therapy yet  He describes low back pain with positional change, such as getting up or sitting down otherwise he denies significant low back pain, denies significant bowel and bladder incontinence  Wife noted he has mild memory trouble, tends to be irritable, today's MoCA examination 21/30,  Update April 14, 2022 He came in with his wife to review MRI of the brain from March 21, 2022, generalized cortical atrophy, ventriculomegaly, seems to be out of proportion to the extent of cortical atrophy, mild small vessel disease  But  patient's slow onset gait abnormality, low back pain, recent noticeable mild cognitive impairment, and urinary urgency, frequent nocturia, are all slow progress, there was no acute change, history does not  suggestive of normal pressure hydrocephalus in addition, his gait does not consistent with typical magnetic gait, or parkinsonian features,  There was no previous imaging to compare, will repeat MRI of the brain in 6 months  He does complains of bilateral hip muscle weakness, limited by left hip pain, MRI of left hip is pending by orthopedic surgeon  Update September 17, 2022 SS: [place] 26/30, 2/16 L4-5 discectomy with Dr. Trenton Gammon. He thinks his strength and walking has improved. He is alone today. He doesn't recall any memory issue, his wife was the one concerned. We called his wife, she thinks he is age appropriate, doing well. He denies any neck pain. Still having nocturia, wearing pull ups at night for almost a year. Taking Oxybutynin 5 mg at bedtime, no real benefit. Had prostate removed about 15 years ago. Last night got up 4-5 times during the night. He is motivated to get back to this walking in the park. He is very knowledgeable, look well today.    MRI cervical spine (without) demonstrating: - At C6-7 posterior disc bulging and facet hypertrophy with moderate spinal stenosis and severe bilateral foraminal stenosis. - At C4-5 posterior leftward disc bulging and facet hypertrophy with mild spinal stenosis and severe left foraminal stenosis. - At C5-6 broad posterior disc bulging and facet hypertrophy with mild spinal stenosis and severe bilateral foraminal stenosis. - Compared to MRI from 02/28/2015 there has been mild progression of degenerative changes.  PHYSICAL EXAM:   Vitals:   09/17/22 1448  BP: (!) 143/70  Pulse: (!) 55  Weight: 219 lb 8 oz (99.6 kg)  Height: '5\' 11"'$  (1.803 m)   Not recorded     Body mass index is 30.61 kg/m.     09/17/2022    2:52 PM 03/17/2022   11:00 AM  Montreal Cognitive Assessment   Visuospatial/ Executive (0/5) 4 3  Naming (0/3) 3 3  Attention: Read list of digits (0/2) 2 2  Attention: Read list of letters (0/1) 1 0  Attention: Serial 7 subtraction  starting at 100 (0/3) 2 2  Language: Repeat phrase (0/2) 2 2  Language : Fluency (0/1) 1 1  Abstraction (0/2) 2 2  Delayed Recall (0/5) 3 0  Orientation (0/6) 6 6  Total 26 21    Physical Exam  General: The patient is alert and cooperative at the time of the examination.  Skin: No significant peripheral edema is noted.  Neurologic Exam  Mental status: The patient is alert and oriented x 3 at the time of the examination. The patient has apparent normal recent and remote memory, with an apparently normal attention span and concentration ability.  Cranial nerves: Facial symmetry is present. Speech is normal, no aphasia or dysarthria is noted. Extraocular movements are full. Visual fields are full.  Motor: The patient has good strength in all 4 extremities.  Mild left-sided hip flexion weakness.  Sensory examination: Soft touch sensation is symmetric on the face, arms, and legs.  Coordination: The patient has good finger-nose-finger and heel-to-shin bilaterally.  Gait and station: The patient has a slightly wide based, cautious gait, can walk independently, has a single-point cane in the hallway if needed  Reflexes: Deep tendon reflexes are symmetric.  I did not appreciate any obvious increased reflexes.   REVIEW OF SYSTEMS:  Full 14 system  review of systems performed and notable only for as above All other review of systems were negative.   ALLERGIES: Allergies  Allergen Reactions   Atorvastatin     Other reaction(s): thrombocytopenia   Tizanidine Hcl     Other reaction(s): dizziness    HOME MEDICATIONS: Current Outpatient Medications  Medication Sig Dispense Refill   amLODipine (NORVASC) 10 MG tablet Take 10 mg by mouth every morning.      aspirin EC 81 MG tablet Take 81 mg by mouth daily. Swallow whole.     atenolol (TENORMIN) 50 MG tablet Take 25 mg by mouth every morning.     cetirizine (ZYRTEC) 10 MG tablet Take 10 mg by mouth daily as needed for allergies.      DULoxetine (CYMBALTA) 30 MG capsule TAKE 1 CAPSULE BY MOUTH EVERY DAY 90 capsule 4   HYDROcodone-acetaminophen (NORCO/VICODIN) 5-325 MG tablet Take 1 tablet by mouth every 4 (four) hours as needed (pain). 30 tablet 0   lisinopril (PRINIVIL,ZESTRIL) 20 MG tablet Take 20 mg by mouth every morning.      Melatonin 3 MG TABS Take 3 mg by mouth at bedtime as needed (sleep).     metFORMIN (GLUCOPHAGE) 1000 MG tablet Take 1,000 mg by mouth daily.     NON FORMULARY Pt uses a cpap nightly     oxybutynin (DITROPAN XL) 10 MG 24 hr tablet Take 1 tablet (10 mg total) by mouth at bedtime. 30 tablet 5   pantoprazole (PROTONIX) 40 MG tablet Take 40 mg by mouth daily as needed (heartburn).  6   rosuvastatin (CRESTOR) 10 MG tablet Take 10 mg by mouth at bedtime.     sodium chloride (OCEAN) 0.65 % SOLN nasal spray Place 1 spray into both nostrils as needed for congestion.     spironolactone (ALDACTONE) 25 MG tablet Take 25 mg by mouth daily.     timolol (TIMOPTIC) 0.5 % ophthalmic solution Place 1 drop into both eyes every morning.     No current facility-administered medications for this visit.    PAST MEDICAL HISTORY: Past Medical History:  Diagnosis Date   Acute meniscal tear of right knee    Arthritis    Cancer ([facility]) 2015   lymphoma new dx area removed behind right ear   Cancer ([facility]) 2010   prostate   Deviated septum    chronic sinusitis, nasal turbinate hypertrophy   Diabetes mellitus without complication ([facility])    on metformin   GERD (gastroesophageal reflux disease)    Hypertension    Pneumonia    Sleep apnea    wears CPAP   Wears glasses     PAST SURGICAL HISTORY: Past Surgical History:  Procedure Laterality Date   back injection     to lower back   BACK SURGERY     lower back   EYE SURGERY Bilateral 2019   cataract   KNEE ARTHROSCOPY Right 03/27/2015   Procedure: RIGHT ARTHROSCOPY KNEE WITH DEBRIDEMENT, PARTIAL LATERAL MENISCECTOMY;  Surgeon: Gaynelle Arabian, MD;  Location: [facility];   Service: Orthopedics;  Laterality: Right;   LUMBAR LAMINECTOMY/DECOMPRESSION MICRODISCECTOMY Bilateral 01/27/2022   Procedure: Laminectomy and Foraminotomy - Bilateral - Lumbar Three-Four/Lumbar Four-Five;  Surgeon: Earnie Larsson, MD;  Location: [facility];  Service: Neurosurgery;  Laterality: Bilateral;  3C   LUMBAR LAMINECTOMY/DECOMPRESSION MICRODISCECTOMY Left 09/04/2022   Procedure: Left Lumbar Four-Five Extraforaminal Decompression;  Surgeon: Earnie Larsson, MD;  Location: [facility];  Service: Neurosurgery;  Laterality: Left;  3C   NASAL SEPTOPLASTY W/ TURBINOPLASTY  Bilateral 04/01/2016   Procedure: NASAL SEPTOPLASTY WITH BILATERAL INFERIOR  TURBINATE REDUCTION;  Surgeon: Jerrell Belfast, MD;  Location: [facility];  Service: ENT;  Laterality: Bilateral;   PROSTATECTOMY  07/20/2008   SINUS ENDO WITH FUSION Right 04/01/2016   Procedure: RIGHT ENDOSCOPIC SINUS SURGERY,AND CONSISTING  OF ANTERIOR ETHMOIDECTOMY;  Surgeon: Jerrell Belfast, MD;  Location[facility];  Service: ENT;  Laterality: Right;   surgery for collapsed lung Left 35 to 40 years ago    FAMILY HISTORY: Family History  Problem Relation Age of Onset   Heart attack Father    Heart attack Sister     SOCIAL HISTORY: Social History   Socioeconomic History   Marital status: Married    Spouse name: Not on file   Number of children: Not on file   Years of education: Not on file   Highest education level: Not on file  Occupational History   Not on file  Tobacco Use   Smoking status: Former    Packs/day: 1.00    Years: 20.00    Total pack years: 20.00    Types: Cigarettes    Quit date: 72    Years since quitting: 34.1   Smokeless tobacco: Never  Vaping Use   Vaping Use: Never used  Substance and Sexual Activity   Alcohol use: No   Drug use: No   Sexual activity: Not on file  Other Topics Concern   Not on file  Social History Narrative   Not on file   Social Determinants of Health   Financial Resource Strain: Not on file  Food  Insecurity: Not on file  Transportation Needs: Not on file  Physical Activity: Not on file  Stress: Not on file  Social Connections: Not on file  Intimate Partner Violence: Not on file     Butler Denmark, Laqueta Jean, [facility] [address] [place], [place] [address] [phone]

## 2022-09-17 NOTE — Telephone Encounter (Signed)
medicare NPR sent to [facility] [phone]

## 2022-09-17 NOTE — Patient Instructions (Addendum)
I will increase your Oxybutynin to 10 mg at bedtime, referral to urology   Check MRI of the brain for comparison   Will see you back in 6 months  Meds ordered this encounter  Medications   oxybutynin (DITROPAN XL) 10 MG 24 hr tablet    Sig: Take 1 tablet (10 mg total) by mouth at bedtime.    Dispense:  30 tablet    Refill:  5

## 2022-09-18 ENCOUNTER — Ambulatory Visit
Admission: RE | Admit: 2022-09-18 | Discharge: 2022-09-18 | Disposition: A | Discharge status: Home / Self Care | Payer: Medicare Other | Source: Ambulatory Visit | Attending: Neurology | Admitting: Neurology

## 2022-09-18 DIAGNOSIS — R269 Unspecified abnormalities of gait and mobility: Secondary | ICD-10-CM

## 2022-09-18 DIAGNOSIS — G3184 Mild cognitive impairment, so stated: Secondary | ICD-10-CM | POA: Diagnosis not present

## 2022-09-21 ENCOUNTER — Telehealth: Payer: Self-pay | Admitting: Neurology

## 2022-09-21 NOTE — Progress Notes (Signed)
Chart reviewed, agree above plan ?

## 2022-09-21 NOTE — Telephone Encounter (Signed)
Referral for urology fax to [facility]. [phone], Fax: [phone]

## 2022-09-29 ENCOUNTER — Ambulatory Visit: Payer: Medicare Other | Admitting: Neurology

## 2022-10-19 DIAGNOSIS — Z1331 Encounter for screening for depression: Secondary | ICD-10-CM | POA: Diagnosis not present

## 2022-10-19 DIAGNOSIS — E7800 Pure hypercholesterolemia, unspecified: Secondary | ICD-10-CM | POA: Diagnosis not present

## 2022-10-19 DIAGNOSIS — M48062 Spinal stenosis, lumbar region with neurogenic claudication: Secondary | ICD-10-CM | POA: Diagnosis not present

## 2022-10-19 DIAGNOSIS — E1121 Type 2 diabetes mellitus with diabetic nephropathy: Secondary | ICD-10-CM | POA: Diagnosis not present

## 2022-10-19 DIAGNOSIS — K5901 Slow transit constipation: Secondary | ICD-10-CM | POA: Diagnosis not present

## 2022-10-19 DIAGNOSIS — N1831 Chronic kidney disease, stage 3a: Secondary | ICD-10-CM | POA: Diagnosis not present

## 2022-10-19 DIAGNOSIS — R269 Unspecified abnormalities of gait and mobility: Secondary | ICD-10-CM | POA: Diagnosis not present

## 2022-10-19 DIAGNOSIS — D696 Thrombocytopenia, unspecified: Secondary | ICD-10-CM | POA: Diagnosis not present

## 2022-10-19 DIAGNOSIS — E1169 Type 2 diabetes mellitus with other specified complication: Secondary | ICD-10-CM | POA: Diagnosis not present

## 2022-10-19 DIAGNOSIS — Z0000 Encounter for general adult medical examination without abnormal findings: Secondary | ICD-10-CM | POA: Diagnosis not present

## 2022-10-19 DIAGNOSIS — Z79899 Other long term (current) drug therapy: Secondary | ICD-10-CM | POA: Diagnosis not present

## 2022-10-19 DIAGNOSIS — I129 Hypertensive chronic kidney disease with stage 1 through stage 4 chronic kidney disease, or unspecified chronic kidney disease: Secondary | ICD-10-CM | POA: Diagnosis not present

## 2022-10-19 DIAGNOSIS — Z23 Encounter for immunization: Secondary | ICD-10-CM | POA: Diagnosis not present

## 2022-10-19 DIAGNOSIS — C8590 Non-Hodgkin lymphoma, unspecified, unspecified site: Secondary | ICD-10-CM | POA: Diagnosis not present

## 2022-10-19 DIAGNOSIS — I700 Atherosclerosis of aorta: Secondary | ICD-10-CM | POA: Diagnosis not present

## 2022-10-20 DIAGNOSIS — R262 Difficulty in walking, not elsewhere classified: Secondary | ICD-10-CM | POA: Diagnosis not present

## 2022-10-20 DIAGNOSIS — M6281 Muscle weakness (generalized): Secondary | ICD-10-CM | POA: Diagnosis not present

## 2022-10-20 DIAGNOSIS — M5459 Other low back pain: Secondary | ICD-10-CM | POA: Diagnosis not present

## 2022-10-20 DIAGNOSIS — M79605 Pain in left leg: Secondary | ICD-10-CM | POA: Diagnosis not present

## 2022-10-22 DIAGNOSIS — M5459 Other low back pain: Secondary | ICD-10-CM | POA: Diagnosis not present

## 2022-10-22 DIAGNOSIS — R262 Difficulty in walking, not elsewhere classified: Secondary | ICD-10-CM | POA: Diagnosis not present

## 2022-10-22 DIAGNOSIS — M6281 Muscle weakness (generalized): Secondary | ICD-10-CM | POA: Diagnosis not present

## 2022-10-22 DIAGNOSIS — M79605 Pain in left leg: Secondary | ICD-10-CM | POA: Diagnosis not present

## 2022-10-27 DIAGNOSIS — M6281 Muscle weakness (generalized): Secondary | ICD-10-CM | POA: Diagnosis not present

## 2022-10-27 DIAGNOSIS — M5459 Other low back pain: Secondary | ICD-10-CM | POA: Diagnosis not present

## 2022-10-27 DIAGNOSIS — M79605 Pain in left leg: Secondary | ICD-10-CM | POA: Diagnosis not present

## 2022-10-27 DIAGNOSIS — R262 Difficulty in walking, not elsewhere classified: Secondary | ICD-10-CM | POA: Diagnosis not present

## 2022-10-29 DIAGNOSIS — M79605 Pain in left leg: Secondary | ICD-10-CM | POA: Diagnosis not present

## 2022-10-29 DIAGNOSIS — M6281 Muscle weakness (generalized): Secondary | ICD-10-CM | POA: Diagnosis not present

## 2022-10-29 DIAGNOSIS — M5459 Other low back pain: Secondary | ICD-10-CM | POA: Diagnosis not present

## 2022-10-29 DIAGNOSIS — R262 Difficulty in walking, not elsewhere classified: Secondary | ICD-10-CM | POA: Diagnosis not present

## 2022-11-02 ENCOUNTER — Ambulatory Visit: Payer: Medicare Other | Admitting: Podiatry

## 2022-11-03 DIAGNOSIS — M5459 Other low back pain: Secondary | ICD-10-CM | POA: Diagnosis not present

## 2022-11-03 DIAGNOSIS — M79605 Pain in left leg: Secondary | ICD-10-CM | POA: Diagnosis not present

## 2022-11-03 DIAGNOSIS — R262 Difficulty in walking, not elsewhere classified: Secondary | ICD-10-CM | POA: Diagnosis not present

## 2022-11-03 DIAGNOSIS — M6281 Muscle weakness (generalized): Secondary | ICD-10-CM | POA: Diagnosis not present

## 2022-11-05 DIAGNOSIS — R262 Difficulty in walking, not elsewhere classified: Secondary | ICD-10-CM | POA: Diagnosis not present

## 2022-11-05 DIAGNOSIS — M5459 Other low back pain: Secondary | ICD-10-CM | POA: Diagnosis not present

## 2022-11-05 DIAGNOSIS — M79605 Pain in left leg: Secondary | ICD-10-CM | POA: Diagnosis not present

## 2022-11-05 DIAGNOSIS — M6281 Muscle weakness (generalized): Secondary | ICD-10-CM | POA: Diagnosis not present

## 2022-11-10 ENCOUNTER — Encounter: Payer: Self-pay | Admitting: Podiatry

## 2022-11-10 ENCOUNTER — Ambulatory Visit (INDEPENDENT_AMBULATORY_CARE_PROVIDER_SITE_OTHER): Payer: Medicare Other | Admitting: Podiatry

## 2022-11-10 DIAGNOSIS — Z794 Long term (current) use of insulin: Secondary | ICD-10-CM | POA: Diagnosis not present

## 2022-11-10 DIAGNOSIS — M79675 Pain in left toe(s): Secondary | ICD-10-CM | POA: Diagnosis not present

## 2022-11-10 DIAGNOSIS — M79605 Pain in left leg: Secondary | ICD-10-CM | POA: Diagnosis not present

## 2022-11-10 DIAGNOSIS — R262 Difficulty in walking, not elsewhere classified: Secondary | ICD-10-CM | POA: Diagnosis not present

## 2022-11-10 DIAGNOSIS — R801 Persistent proteinuria, unspecified: Secondary | ICD-10-CM | POA: Insufficient documentation

## 2022-11-10 DIAGNOSIS — M6281 Muscle weakness (generalized): Secondary | ICD-10-CM | POA: Diagnosis not present

## 2022-11-10 DIAGNOSIS — E1169 Type 2 diabetes mellitus with other specified complication: Secondary | ICD-10-CM | POA: Diagnosis not present

## 2022-11-10 DIAGNOSIS — B351 Tinea unguium: Secondary | ICD-10-CM

## 2022-11-10 DIAGNOSIS — M79674 Pain in right toe(s): Secondary | ICD-10-CM | POA: Diagnosis not present

## 2022-11-10 DIAGNOSIS — M5459 Other low back pain: Secondary | ICD-10-CM | POA: Diagnosis not present

## 2022-11-10 NOTE — Progress Notes (Signed)
  Subjective:  Patient ID: [name], male    DOB: 1940/12/29,   MRN: [id number]  Chief Complaint  Patient presents with   Diabetes    Foot exam - diabetic - last A1c was 7.1, PCP recommended he start having them trimmed   New Patient (Initial Visit)    82 y.o. male presents for concern of thickened elongated and painful nails that are difficult to trim. Requesting to have them trimmed today. Denies burning and tingling in their feet. Patient is diabetic and last A1c was  Lab Results  Component Value Date   HGBA1C 7.1 (H) 09/01/2022   .   PCP:  Emilio Aspen, MD    . Denies any other pedal complaints. Denies n/v/f/c.   Past Medical History:  Diagnosis Date   Acute meniscal tear of right knee    Arthritis    Cancer 2015   lymphoma new dx area removed behind right ear   Cancer 2010   prostate   Deviated septum    chronic sinusitis, nasal turbinate hypertrophy   Diabetes mellitus without complication    on metformin   GERD (gastroesophageal reflux disease)    Hypertension    Pneumonia    Sleep apnea    wears CPAP   Wears glasses     Objective:  Vascular: DP/PT pulses 2/4 bilateral. CFT <3 seconds. Absent hair growth on digits. Edema noted to bilateral lower extremities. Xerosis noted bilaterally.  Skin. No lacerations or abrasions bilateral feet. Nails 1-5 bilateral  are thickened discolored and elongated with subungual debris.  Musculoskeletal: MMT 5/5 bilateral lower extremities in DF, PF, Inversion and Eversion. Deceased ROM in DF of ankle joint.  Neurological: Sensation intact to light touch. Protective sensation diminished bilateral.    Assessment:   1. Pain due to onychomycosis of toenails of both feet   2. Type 2 diabetes mellitus with other specified complication, with long-term current use of insulin      Plan:  Patient was evaluated and treated and all questions answered. -Discussed and educated patient on diabetic foot care, especially  with  regards to the vascular, neurological and musculoskeletal systems.  -Stressed the importance of good glycemic control and the detriment of not  controlling glucose levels in relation to the foot. -Discussed supportive shoes at all times and checking feet regularly.  -Mechanically debrided all nails 1-5 bilateral using sterile nail nipper and filed with dremel without incident  -Answered all patient questions -Patient to return  in 3 months for at risk foot care -Patient advised to call the office if any problems or questions arise in the meantime.   Louann Sjogren, DPM

## 2022-11-11 DIAGNOSIS — Z8546 Personal history of malignant neoplasm of prostate: Secondary | ICD-10-CM | POA: Diagnosis not present

## 2022-11-11 DIAGNOSIS — N393 Stress incontinence (female) (male): Secondary | ICD-10-CM | POA: Diagnosis not present

## 2022-11-11 DIAGNOSIS — N5201 Erectile dysfunction due to arterial insufficiency: Secondary | ICD-10-CM | POA: Diagnosis not present

## 2022-11-17 DIAGNOSIS — M5459 Other low back pain: Secondary | ICD-10-CM | POA: Diagnosis not present

## 2022-11-17 DIAGNOSIS — R262 Difficulty in walking, not elsewhere classified: Secondary | ICD-10-CM | POA: Diagnosis not present

## 2022-11-17 DIAGNOSIS — M79605 Pain in left leg: Secondary | ICD-10-CM | POA: Diagnosis not present

## 2022-11-17 DIAGNOSIS — M6281 Muscle weakness (generalized): Secondary | ICD-10-CM | POA: Diagnosis not present

## 2022-11-19 DIAGNOSIS — M6281 Muscle weakness (generalized): Secondary | ICD-10-CM | POA: Diagnosis not present

## 2022-11-19 DIAGNOSIS — M5459 Other low back pain: Secondary | ICD-10-CM | POA: Diagnosis not present

## 2022-11-19 DIAGNOSIS — M79605 Pain in left leg: Secondary | ICD-10-CM | POA: Diagnosis not present

## 2022-11-19 DIAGNOSIS — R262 Difficulty in walking, not elsewhere classified: Secondary | ICD-10-CM | POA: Diagnosis not present

## 2022-11-24 DIAGNOSIS — M6281 Muscle weakness (generalized): Secondary | ICD-10-CM | POA: Diagnosis not present

## 2022-11-24 DIAGNOSIS — M5459 Other low back pain: Secondary | ICD-10-CM | POA: Diagnosis not present

## 2022-11-24 DIAGNOSIS — M79605 Pain in left leg: Secondary | ICD-10-CM | POA: Diagnosis not present

## 2022-11-24 DIAGNOSIS — R262 Difficulty in walking, not elsewhere classified: Secondary | ICD-10-CM | POA: Diagnosis not present

## 2022-11-26 DIAGNOSIS — M5459 Other low back pain: Secondary | ICD-10-CM | POA: Diagnosis not present

## 2022-11-26 DIAGNOSIS — R262 Difficulty in walking, not elsewhere classified: Secondary | ICD-10-CM | POA: Diagnosis not present

## 2022-11-26 DIAGNOSIS — M79605 Pain in left leg: Secondary | ICD-10-CM | POA: Diagnosis not present

## 2022-11-26 DIAGNOSIS — M6281 Muscle weakness (generalized): Secondary | ICD-10-CM | POA: Diagnosis not present

## 2022-12-01 DIAGNOSIS — M79605 Pain in left leg: Secondary | ICD-10-CM | POA: Diagnosis not present

## 2022-12-01 DIAGNOSIS — R262 Difficulty in walking, not elsewhere classified: Secondary | ICD-10-CM | POA: Diagnosis not present

## 2022-12-01 DIAGNOSIS — M5459 Other low back pain: Secondary | ICD-10-CM | POA: Diagnosis not present

## 2022-12-01 DIAGNOSIS — M6281 Muscle weakness (generalized): Secondary | ICD-10-CM | POA: Diagnosis not present

## 2022-12-03 DIAGNOSIS — M6281 Muscle weakness (generalized): Secondary | ICD-10-CM | POA: Diagnosis not present

## 2022-12-03 DIAGNOSIS — M79605 Pain in left leg: Secondary | ICD-10-CM | POA: Diagnosis not present

## 2022-12-03 DIAGNOSIS — M5459 Other low back pain: Secondary | ICD-10-CM | POA: Diagnosis not present

## 2022-12-03 DIAGNOSIS — R262 Difficulty in walking, not elsewhere classified: Secondary | ICD-10-CM | POA: Diagnosis not present

## 2022-12-08 DIAGNOSIS — M5459 Other low back pain: Secondary | ICD-10-CM | POA: Diagnosis not present

## 2022-12-08 DIAGNOSIS — R262 Difficulty in walking, not elsewhere classified: Secondary | ICD-10-CM | POA: Diagnosis not present

## 2022-12-08 DIAGNOSIS — M79605 Pain in left leg: Secondary | ICD-10-CM | POA: Diagnosis not present

## 2022-12-08 DIAGNOSIS — M6281 Muscle weakness (generalized): Secondary | ICD-10-CM | POA: Diagnosis not present

## 2022-12-09 DIAGNOSIS — M48061 Spinal stenosis, lumbar region without neurogenic claudication: Secondary | ICD-10-CM | POA: Diagnosis not present

## 2022-12-15 DIAGNOSIS — M79605 Pain in left leg: Secondary | ICD-10-CM | POA: Diagnosis not present

## 2022-12-15 DIAGNOSIS — R262 Difficulty in walking, not elsewhere classified: Secondary | ICD-10-CM | POA: Diagnosis not present

## 2022-12-15 DIAGNOSIS — M5459 Other low back pain: Secondary | ICD-10-CM | POA: Diagnosis not present

## 2022-12-15 DIAGNOSIS — M6281 Muscle weakness (generalized): Secondary | ICD-10-CM | POA: Diagnosis not present

## 2022-12-16 DIAGNOSIS — N189 Chronic kidney disease, unspecified: Secondary | ICD-10-CM | POA: Diagnosis not present

## 2022-12-16 DIAGNOSIS — N183 Chronic kidney disease, stage 3 unspecified: Secondary | ICD-10-CM | POA: Diagnosis not present

## 2022-12-16 DIAGNOSIS — E7800 Pure hypercholesterolemia, unspecified: Secondary | ICD-10-CM | POA: Diagnosis not present

## 2022-12-16 DIAGNOSIS — R809 Proteinuria, unspecified: Secondary | ICD-10-CM | POA: Diagnosis not present

## 2022-12-16 DIAGNOSIS — I129 Hypertensive chronic kidney disease with stage 1 through stage 4 chronic kidney disease, or unspecified chronic kidney disease: Secondary | ICD-10-CM | POA: Diagnosis not present

## 2022-12-16 DIAGNOSIS — E1122 Type 2 diabetes mellitus with diabetic chronic kidney disease: Secondary | ICD-10-CM | POA: Diagnosis not present

## 2022-12-22 ENCOUNTER — Other Ambulatory Visit: Payer: Self-pay | Admitting: Internal Medicine

## 2022-12-22 DIAGNOSIS — E7800 Pure hypercholesterolemia, unspecified: Secondary | ICD-10-CM

## 2022-12-22 DIAGNOSIS — R809 Proteinuria, unspecified: Secondary | ICD-10-CM

## 2022-12-22 DIAGNOSIS — I129 Hypertensive chronic kidney disease with stage 1 through stage 4 chronic kidney disease, or unspecified chronic kidney disease: Secondary | ICD-10-CM

## 2022-12-22 DIAGNOSIS — E1122 Type 2 diabetes mellitus with diabetic chronic kidney disease: Secondary | ICD-10-CM

## 2022-12-23 ENCOUNTER — Ambulatory Visit
Admission: RE | Admit: 2022-12-23 | Discharge: 2022-12-23 | Disposition: A | Discharge status: Home / Self Care | Payer: Medicare Other | Source: Ambulatory Visit | Attending: Internal Medicine | Admitting: Internal Medicine

## 2022-12-23 DIAGNOSIS — E1122 Type 2 diabetes mellitus with diabetic chronic kidney disease: Secondary | ICD-10-CM

## 2022-12-23 DIAGNOSIS — R809 Proteinuria, unspecified: Secondary | ICD-10-CM

## 2022-12-23 DIAGNOSIS — I129 Hypertensive chronic kidney disease with stage 1 through stage 4 chronic kidney disease, or unspecified chronic kidney disease: Secondary | ICD-10-CM

## 2022-12-23 DIAGNOSIS — E7800 Pure hypercholesterolemia, unspecified: Secondary | ICD-10-CM

## 2023-01-26 ENCOUNTER — Inpatient Hospital Stay: Payer: Medicare Other | Attending: Oncology | Admitting: Oncology

## 2023-01-26 VITALS — BP 142/64 | HR 65 | Temp 98.1°F | Resp 18 | Ht 71.0 in | Wt 219.1 lb

## 2023-01-26 DIAGNOSIS — C8300 Small cell B-cell lymphoma, unspecified site: Secondary | ICD-10-CM | POA: Diagnosis not present

## 2023-01-26 DIAGNOSIS — I10 Essential (primary) hypertension: Secondary | ICD-10-CM | POA: Diagnosis not present

## 2023-01-26 DIAGNOSIS — E785 Hyperlipidemia, unspecified: Secondary | ICD-10-CM | POA: Diagnosis not present

## 2023-01-26 DIAGNOSIS — C884 Extranodal marginal zone B-cell lymphoma of mucosa-associated lymphoid tissue [MALT-lymphoma]: Secondary | ICD-10-CM | POA: Diagnosis not present

## 2023-01-26 DIAGNOSIS — E119 Type 2 diabetes mellitus without complications: Secondary | ICD-10-CM | POA: Insufficient documentation

## 2023-01-26 DIAGNOSIS — Z9079 Acquired absence of other genital organ(s): Secondary | ICD-10-CM | POA: Diagnosis not present

## 2023-01-26 DIAGNOSIS — Z79899 Other long term (current) drug therapy: Secondary | ICD-10-CM | POA: Diagnosis not present

## 2023-01-26 DIAGNOSIS — M549 Dorsalgia, unspecified: Secondary | ICD-10-CM | POA: Insufficient documentation

## 2023-01-26 NOTE — Progress Notes (Signed)
  [facility] OFFICE PROGRESS NOTE   Diagnosis: Non-Hodgkin's Phoma  INTERVAL HISTORY:   Mr. [name] returns as scheduled.  He feels well.  Good appetite.  No fever or night sweats.  He reports 1 "sweats "while at church.  No palpable change at the right ear.  Back pain improved partially following a left L4-5 foraminotomy in February.  Objective:  Vital signs in last 24 hours:  Blood pressure (!) 142/64, pulse 65, temperature 98.1 F (36.7 C), temperature source Oral, resp. rate 18, height 5\' 11"  (1.803 m), weight 219 lb 1.6 oz (99.4 kg), SpO2 100 %.    HEENT: Oropharynx without visible mass, neck without mass, right postauricular scar without evidence of recurrent tumor Lymphatics: No cervical, supraclavicular, axillary, or inguinal nodes Resp: Lungs clear bilaterally Cardio: Regular rate and rhythm GI: No hepatosplenomegaly, no mass, nontender Vascular: No leg edema  Lab Results:  Lab Results  Component Value Date   WBC 6.6 09/01/2022   HGB 12.6 (L) 09/01/2022   HCT 37.4 (L) 09/01/2022   MCV 92.1 09/01/2022   PLT 190 09/01/2022   NEUTROABS 4.4 04/30/2014    CMP  Lab Results  Component Value Date   NA 136 09/01/2022   K 4.2 09/01/2022   CL 105 09/01/2022   CO2 25 09/01/2022   GLUCOSE 170 (H) 09/01/2022   BUN 13 09/01/2022   CREATININE 1.12 09/01/2022   CALCIUM 9.0 09/01/2022   PROT 8.3 04/30/2014   ALBUMIN 4.2 04/30/2014   AST 18 04/30/2014   ALT 22 04/30/2014   ALKPHOS 58 04/30/2014   BILITOT 0.35 04/30/2014   GFRNONAA >60 09/01/2022   GFRAA >60 03/30/2016     Medications: I have reviewed the patient's current medications.   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  Prostate cancer diagnosed in 2009, status post prostatectomy   3.   Diabetes   4.   Hypertension   5.   hyperlipidemiq   6.  Zoster rash right forehead/periorbital region April 2018  7.  Positional vertigo June 2021  8.  Left L4-5 foraminotomy 09/04/2022     Disposition: Mr. [name] remains in clinical remission from the marginal zone lymphoma.  He would like continue follow-up with the cancer center.  He will return for an office visit in 1 year.  He will call for new symptoms.  Thornton Papas, MD  01/26/2023  12:50 PM

## 2023-02-03 DIAGNOSIS — H40053 Ocular hypertension, bilateral: Secondary | ICD-10-CM | POA: Diagnosis not present

## 2023-02-09 ENCOUNTER — Ambulatory Visit: Payer: Medicare Other | Admitting: Podiatry

## 2023-02-15 ENCOUNTER — Encounter: Payer: Self-pay | Admitting: Podiatry

## 2023-02-15 ENCOUNTER — Ambulatory Visit: Payer: Medicare Other | Admitting: Podiatry

## 2023-02-15 DIAGNOSIS — Z794 Long term (current) use of insulin: Secondary | ICD-10-CM | POA: Diagnosis not present

## 2023-02-15 DIAGNOSIS — E1169 Type 2 diabetes mellitus with other specified complication: Secondary | ICD-10-CM | POA: Diagnosis not present

## 2023-02-15 DIAGNOSIS — M79674 Pain in right toe(s): Secondary | ICD-10-CM | POA: Diagnosis not present

## 2023-02-15 DIAGNOSIS — M79675 Pain in left toe(s): Secondary | ICD-10-CM

## 2023-02-15 DIAGNOSIS — B351 Tinea unguium: Secondary | ICD-10-CM

## 2023-02-15 NOTE — Progress Notes (Signed)
  Subjective:  Patient ID: [name], male    DOB: 15-Nov-1940,   MRN: [id number]  No chief complaint on file.   82 y.o. male presents for concern of thickened elongated and painful nails that are difficult to trim. Requesting to have them trimmed today. Denies burning and tingling in their feet. Patient is diabetic and last A1c was  Lab Results  Component Value Date   HGBA1C 7.1 (H) 09/01/2022   .   PCP:  Emilio Aspen, MD    . Denies any other pedal complaints. Denies n/v/f/c.   Past Medical History:  Diagnosis Date   Acute meniscal tear of right knee    Arthritis    Cancer ([facility]) 2015   lymphoma new dx area removed behind right ear   Cancer ([facility]) 2010   prostate   Deviated septum    chronic sinusitis, nasal turbinate hypertrophy   Diabetes mellitus without complication ([facility])    on metformin   GERD (gastroesophageal reflux disease)    Hypertension    Pneumonia    Sleep apnea    wears CPAP   Wears glasses     Objective:  Vascular: DP/PT pulses 2/4 bilateral. CFT <3 seconds. Absent hair growth on digits. Edema noted to bilateral lower extremities. Xerosis noted bilaterally.  Skin. No lacerations or abrasions bilateral feet. Nails 1-5 bilateral  are thickened discolored and elongated with subungual debris.  Musculoskeletal: MMT 5/5 bilateral lower extremities in DF, PF, Inversion and Eversion. Deceased ROM in DF of ankle joint.  Neurological: Sensation intact to light touch. Protective sensation diminished bilateral.    Assessment:   1. Pain due to onychomycosis of toenails of both feet   2. Type 2 diabetes mellitus with other specified complication, with long-term current use of insulin ([facility])       Plan:  Patient was evaluated and treated and all questions answered. -Discussed and educated patient on diabetic foot care, especially with  regards to the vascular, neurological and musculoskeletal systems.  -Stressed the importance of good glycemic  control and the detriment of not  controlling glucose levels in relation to the foot. -Discussed supportive shoes at all times and checking feet regularly.  -Mechanically debrided all nails 1-5 bilateral using sterile nail nipper and filed with dremel without incident  -Answered all patient questions -Patient to return  in 3 months for at risk foot care -Patient advised to call the office if any problems or questions arise in the meantime.   Louann Sjogren, DPM

## 2023-02-18 DIAGNOSIS — I129 Hypertensive chronic kidney disease with stage 1 through stage 4 chronic kidney disease, or unspecified chronic kidney disease: Secondary | ICD-10-CM | POA: Diagnosis not present

## 2023-02-18 DIAGNOSIS — E7800 Pure hypercholesterolemia, unspecified: Secondary | ICD-10-CM | POA: Diagnosis not present

## 2023-02-18 DIAGNOSIS — E1122 Type 2 diabetes mellitus with diabetic chronic kidney disease: Secondary | ICD-10-CM | POA: Diagnosis not present

## 2023-02-18 DIAGNOSIS — R809 Proteinuria, unspecified: Secondary | ICD-10-CM | POA: Diagnosis not present

## 2023-02-18 DIAGNOSIS — E669 Obesity, unspecified: Secondary | ICD-10-CM | POA: Diagnosis not present

## 2023-02-22 DIAGNOSIS — N5201 Erectile dysfunction due to arterial insufficiency: Secondary | ICD-10-CM | POA: Diagnosis not present

## 2023-02-22 DIAGNOSIS — C61 Malignant neoplasm of prostate: Secondary | ICD-10-CM | POA: Diagnosis not present

## 2023-02-22 DIAGNOSIS — N393 Stress incontinence (female) (male): Secondary | ICD-10-CM | POA: Diagnosis not present

## 2023-03-09 DIAGNOSIS — L814 Other melanin hyperpigmentation: Secondary | ICD-10-CM | POA: Diagnosis not present

## 2023-03-09 DIAGNOSIS — L988 Other specified disorders of the skin and subcutaneous tissue: Secondary | ICD-10-CM | POA: Diagnosis not present

## 2023-03-09 DIAGNOSIS — Z85828 Personal history of other malignant neoplasm of skin: Secondary | ICD-10-CM | POA: Diagnosis not present

## 2023-03-09 DIAGNOSIS — D485 Neoplasm of uncertain behavior of skin: Secondary | ICD-10-CM | POA: Diagnosis not present

## 2023-03-09 DIAGNOSIS — L821 Other seborrheic keratosis: Secondary | ICD-10-CM | POA: Diagnosis not present

## 2023-03-18 ENCOUNTER — Encounter: Payer: Self-pay | Admitting: Neurology

## 2023-03-18 ENCOUNTER — Ambulatory Visit (INDEPENDENT_AMBULATORY_CARE_PROVIDER_SITE_OTHER): Payer: Medicare Other | Admitting: Neurology

## 2023-03-18 VITALS — BP 124/70 | HR 88 | Ht 71.0 in | Wt 218.0 lb

## 2023-03-18 DIAGNOSIS — G3184 Mild cognitive impairment, so stated: Secondary | ICD-10-CM

## 2023-03-18 DIAGNOSIS — R269 Unspecified abnormalities of gait and mobility: Secondary | ICD-10-CM

## 2023-03-18 DIAGNOSIS — M47816 Spondylosis without myelopathy or radiculopathy, lumbar region: Secondary | ICD-10-CM

## 2023-03-18 MED ORDER — DULOXETINE HCL 30 MG PO CPEP: 30.0000 mg | ORAL_CAPSULE | Freq: Every day | ORAL | 4 refills | Status: DC

## 2023-03-18 NOTE — Progress Notes (Signed)
Chief Complaint  Patient presents with   Follow-up    Rm 14. Patient reports doing well, thinks memory is okay. Will complete [facility]   ASSESSMENT AND PLAN  [name] is a 82 y.o. male    1.  Status post lumbar decompression surgery July 2023, Feb 2024 -Improvement with second surgery, pain is less, walking is steadier  2.  Cognitive impairment -Improved, MOCA 27/30, here alone today, appears very knowledgeable -Repeat MRI of the brain for comparison, MRI of the brain in September 2023, generalized atrophy, ventriculomegaly, seems to be out of proportion to the extent of cortical atrophy, repeat MRI of the brain over 23-month span showed no significant change, His cognitive impairment, gait abnormalities, urinary incontinence have occurred gradually.  Also had a history of prostate cancer, prostatectomy, -The history are most consistent with central nervous system degenerative disorder, no rapid decline in his cognitive malfunction, does not support normal pressure hydrocephalus,  3.  Gait abnormality -MRI cervical spine October 2023 at C6-7 moderate spinal stenosis, varying degree of foraminal narrowing, he denies neck pain   4.  Nocturia, urinary incontinence, history of prostate cancer, prostatectomy Seen by urologist, overall doing well,  Follow up with PCP, only return for new issues  DIAGNOSTIC DATA (LABS, IMAGING, TESTING) - I reviewed patient records, labs, notes, testing and imaging myself where available.  Laboratory evaluation February 2023, triglyceride 124, LDL 73 A1c 7.2, normal CMP, CBC with hemoglobin of 13.0, A1c 7.2,  MEDICAL HISTORY:  [name], is a 82 year old male seen in request by his primary care physician Dr. Pete Glatter, Ann Maki, for evaluation of hand tremor, he is accompanied by his wife at today's visit on November 05, 2021  I reviewed and summarized the referring note. PMHX. 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  He has long history of chronic low back pain, has lumbar decompression surgery in the past, which did help his symptoms some,  He began to noticed worsening low back pain since January 2023, radiating down to bilateral buttock, and posterior thigh, getting worse with change positions such as getting up and down  He has been under pain management Dr. Murray Hodgkins over the past few months, had injection to lower lumbar region, will is only good for 3 weeks  Personally reviewed MRI of lumbar without contrast November 2022, multilevel degenerative changes, significant spinal stenosis L3-4, variable degree of foraminal narrowing,  He has started physical therapy recently, which seems to help him some, he denies bowel and bladder incontinence denied persistent lower extremity muscle weakness  UPDATE March 17 2922: He is accompanied by his wife at today's clinical visit, he underwent bilateral L3-4, L4-5 decompressive laminotomy with foraminotomy by Dr. Lelon Perla January 27, 2022  This has helped his low back pain, while in the rehab he was doing very well, able to ambulate much better  But since discharge, wife reported that he continued to regress, tends to sit, feels frustrated easily, rely on his walker sometimes, bending over, he has not started home physical therapy yet  He describes low back pain with positional change, such as getting up or sitting down otherwise he denies significant low back pain, denies significant bowel and bladder incontinence  Wife noted he has mild memory trouble, tends to be irritable, today's MoCA examination 21/30,  Update April 14, 2022 He came in with his wife to review MRI of the  brain from March 21, 2022, generalized cortical atrophy, ventriculomegaly, seems to be out of proportion to the extent of cortical atrophy, mild small vessel disease  But patient's slow onset gait  abnormality, low back pain, recent noticeable mild cognitive impairment, and urinary urgency, frequent nocturia, are all slow progress, there was no acute change, history does not suggestive of normal pressure hydrocephalus in addition, his gait does not consistent with typical magnetic gait, or parkinsonian features,  There was no previous imaging to compare, will repeat MRI of the brain in 6 months  He does complains of bilateral hip muscle weakness, limited by left hip pain, MRI of left hip is pending by orthopedic surgeon  UPDATE August 29th 2024: He drove to clinic today, overall doing well, [place], tends to be sedentary  He continued on Cymbalta 30 mg daily, which seems to help his achy pain better, Moca examination 27/30  Mild gait abnormality, denies significant neck pain, personally reviewed MRI cervical spine October 2023, multilevel degenerative changes, most obvious at C 6 7, with moderate canal stenosis, variable degree of foraminal narrowing  Personally reviewed MRI of the brain in March 2024 in comparison to September 2023, generalized atrophy, moderate ventriculomegaly, no significant change compared to previous scan  He was seen by urologist for his urinary problem, no longer taking oxybutynin, melatonin 3 mg  every night has been helpful  PHYSICAL EXAM:   Vitals:   03/18/23 1030 03/18/23 1039  BP: (!) 143/68 124/70  Pulse: 88 88  Weight: 218 lb (98.9 kg)   Height: 5\' 11"  (1.803 m)    Body mass index is 30.4 kg/m.     03/18/2023   10:31 AM 09/17/2022    2:52 PM 03/17/2022   11:00 AM  Montreal Cognitive Assessment   Visuospatial/ Executive (0/5) 3 4 3   Naming (0/3) 3 3 3   Attention: Read list of digits (0/2) 2 2 2   Attention: Read list of letters (0/1) 0 1 0  Attention: Serial 7 subtraction starting at 100 (0/3) 3 2 2   Language: Repeat phrase (0/2) 2 2 2   Language : Fluency (0/1) 1 1 1   Abstraction (0/2) 2 2 2   Delayed Recall (0/5) 5 3 0   Orientation (0/6) 6 6 6   Total 27 26 21     Physical Exam  General: The patient is alert and cooperative at the time of the examination.  Skin: No significant peripheral edema is noted.  Neurologic Exam  Mental status: The patient is alert and oriented x 3 at the time of the examination. The patient has apparent normal recent and remote memory, with an apparently normal attention span and concentration ability.  Cranial nerves: Facial symmetry is present. Speech is normal, no aphasia or dysarthria is noted. Extraocular movements are full. Visual fields are full.  Motor: The patient has good strength in all 4 extremities.  Mild left-sided hip flexion weakness.  Sensory examination: Soft touch sensation is symmetric on the face, arms, and legs.  Coordination: The patient has good finger-nose-finger and heel-to-shin bilaterally.  Gait and station: The patient has a slightly wide based, cautious gait, can walk independently,    Reflexes: Deep tendon reflexes are symmetric.    REVIEW OF SYSTEMS:  Full 14 system review of systems performed and notable only for as above All other review of systems were negative.   ALLERGIES: Allergies  Allergen Reactions   Atorvastatin     Other reaction(s): thrombocytopenia   Tizanidine Hcl  Other reaction(s): dizziness    HOME MEDICATIONS: Current Outpatient Medications  Medication Sig Dispense Refill   amLODipine (NORVASC) 10 MG tablet Take 10 mg by mouth every morning.      aspirin EC 81 MG tablet Take 81 mg by mouth daily. Swallow whole.     atenolol (TENORMIN) 50 MG tablet Take 25 mg by mouth every morning.     cetirizine (ZYRTEC) 10 MG tablet Take 10 mg by mouth daily as needed for allergies.     DULoxetine (CYMBALTA) 30 MG capsule TAKE 1 CAPSULE BY MOUTH EVERY DAY 90 capsule 4   HYDROcodone-acetaminophen (NORCO/VICODIN) 5-325 MG tablet Take 1 tablet by mouth every 4 (four) hours as needed (pain). 30 tablet 0   lisinopril  (PRINIVIL,ZESTRIL) 20 MG tablet Take 20 mg by mouth every morning.      Melatonin 3 MG TABS Take 3 mg by mouth at bedtime as needed (sleep).     metFORMIN (GLUCOPHAGE) 1000 MG tablet Take 1,000 mg by mouth daily.     NON FORMULARY Pt uses a cpap nightly     pantoprazole (PROTONIX) 40 MG tablet Take 40 mg by mouth daily as needed (heartburn).  6   rosuvastatin (CRESTOR) 10 MG tablet Take 10 mg by mouth at bedtime.     sodium chloride (OCEAN) 0.65 % SOLN nasal spray Place 1 spray into both nostrils as needed for congestion.     spironolactone (ALDACTONE) 25 MG tablet Take 25 mg by mouth daily.     timolol (TIMOPTIC) 0.5 % ophthalmic solution Place 1 drop into both eyes every morning.     oxybutynin (DITROPAN XL) 10 MG 24 hr tablet Take 1 tablet (10 mg total) by mouth at bedtime. 30 tablet 5   No current facility-administered medications for this visit.    PAST MEDICAL HISTORY: Past Medical History:  Diagnosis Date   Acute meniscal tear of right knee    Arthritis    Cancer (HCC) 2015   lymphoma new dx area removed behind right ear   Cancer (HCC) 2010   prostate   Deviated septum    chronic sinusitis, nasal turbinate hypertrophy   Diabetes mellitus without complication (HCC)    on metformin   GERD (gastroesophageal reflux disease)    Hypertension    Pneumonia    Sleep apnea    wears CPAP   Wears glasses     PAST SURGICAL HISTORY: Past Surgical History:  Procedure Laterality Date   back injection     to lower back   BACK SURGERY     lower back   EYE SURGERY Bilateral 2019   cataract   KNEE ARTHROSCOPY Right 03/27/2015   Procedure: RIGHT ARTHROSCOPY KNEE WITH DEBRIDEMENT, PARTIAL LATERAL MENISCECTOMY;  Surgeon: Ollen Gross, MD;  Location: [facility] ORS;  Service: Orthopedics;  Laterality: Right;   LUMBAR LAMINECTOMY/DECOMPRESSION MICRODISCECTOMY Bilateral 01/27/2022   Procedure: Laminectomy and Foraminotomy - Bilateral - Lumbar Three-Four/Lumbar Four-Five;  Surgeon: Julio Sicks,  MD;  Location: [facility] OR;  Service: Neurosurgery;  Laterality: Bilateral;  3C   LUMBAR LAMINECTOMY/DECOMPRESSION MICRODISCECTOMY Left 09/04/2022   Procedure: Left Lumbar Four-Five Extraforaminal Decompression;  Surgeon: Julio Sicks, MD;  Location: [facility] OR;  Service: Neurosurgery;  Laterality: Left;  3C   NASAL SEPTOPLASTY W/ TURBINOPLASTY Bilateral 04/01/2016   Procedure: NASAL SEPTOPLASTY WITH BILATERAL INFERIOR  TURBINATE REDUCTION;  Surgeon: Osborn Coho, MD;  Location: [facility] OR;  Service: ENT;  Laterality: Bilateral;   PROSTATECTOMY  07/20/2008   SINUS ENDO WITH FUSION Right 04/01/2016  Procedure: RIGHT ENDOSCOPIC SINUS SURGERY,AND CONSISTING  OF ANTERIOR ETHMOIDECTOMY;  Surgeon: Osborn Coho, MD;  Location: [facility] OR;  Service: ENT;  Laterality: Right;   surgery for collapsed lung Left 35 to 40 years ago    FAMILY HISTORY: Family History  Problem Relation Age of Onset   Heart attack Father    Heart attack Sister     SOCIAL HISTORY: Social History   Socioeconomic History   Marital status: Married    Spouse name: Not on file   Number of children: Not on file   Years of education: Not on file   Highest education level: Not on file  Occupational History   Not on file  Tobacco Use   Smoking status: Former    Current packs/day: 0.00    Average packs/day: 1 pack/day for 20.0 years (20.0 ttl pk-yrs)    Types: Cigarettes    Start date: 79    Quit date: 93    Years since quitting: 34.6   Smokeless tobacco: Never  Vaping Use   Vaping status: Never Used  Substance and Sexual Activity   Alcohol use: No   Drug use: No   Sexual activity: Not on file  Other Topics Concern   Not on file  Social History Narrative   Not on file   Social Determinants of Health   Financial Resource Strain: Not on file  Food Insecurity: Not on file  Transportation Needs: Not on file  Physical Activity: Not on file  Stress: Not on file  Social Connections: Not on file  Intimate Partner Violence:  Not on file     Levert Feinstein, M.D. Ph.D.  [facility] [address] [place], [place] [address] Phone: [phone] Fax:      [phone]

## 2023-03-23 DIAGNOSIS — R809 Proteinuria, unspecified: Secondary | ICD-10-CM | POA: Diagnosis not present

## 2023-03-30 DIAGNOSIS — Z23 Encounter for immunization: Secondary | ICD-10-CM | POA: Diagnosis not present

## 2023-04-21 DIAGNOSIS — M5442 Lumbago with sciatica, left side: Secondary | ICD-10-CM | POA: Diagnosis not present

## 2023-04-21 DIAGNOSIS — R801 Persistent proteinuria, unspecified: Secondary | ICD-10-CM | POA: Diagnosis not present

## 2023-04-21 DIAGNOSIS — I129 Hypertensive chronic kidney disease with stage 1 through stage 4 chronic kidney disease, or unspecified chronic kidney disease: Secondary | ICD-10-CM | POA: Diagnosis not present

## 2023-04-21 DIAGNOSIS — N1831 Chronic kidney disease, stage 3a: Secondary | ICD-10-CM | POA: Diagnosis not present

## 2023-04-21 DIAGNOSIS — E1121 Type 2 diabetes mellitus with diabetic nephropathy: Secondary | ICD-10-CM | POA: Diagnosis not present

## 2023-04-21 DIAGNOSIS — E1169 Type 2 diabetes mellitus with other specified complication: Secondary | ICD-10-CM | POA: Diagnosis not present

## 2023-04-21 DIAGNOSIS — C8590 Non-Hodgkin lymphoma, unspecified, unspecified site: Secondary | ICD-10-CM | POA: Diagnosis not present

## 2023-04-21 DIAGNOSIS — M48062 Spinal stenosis, lumbar region with neurogenic claudication: Secondary | ICD-10-CM | POA: Diagnosis not present

## 2023-04-21 DIAGNOSIS — K219 Gastro-esophageal reflux disease without esophagitis: Secondary | ICD-10-CM | POA: Diagnosis not present

## 2023-04-21 DIAGNOSIS — I700 Atherosclerosis of aorta: Secondary | ICD-10-CM | POA: Diagnosis not present

## 2023-04-21 DIAGNOSIS — D649 Anemia, unspecified: Secondary | ICD-10-CM | POA: Diagnosis not present

## 2023-05-13 DIAGNOSIS — M48061 Spinal stenosis, lumbar region without neurogenic claudication: Secondary | ICD-10-CM | POA: Diagnosis not present

## 2023-05-17 DIAGNOSIS — E1122 Type 2 diabetes mellitus with diabetic chronic kidney disease: Secondary | ICD-10-CM | POA: Diagnosis not present

## 2023-05-17 DIAGNOSIS — I129 Hypertensive chronic kidney disease with stage 1 through stage 4 chronic kidney disease, or unspecified chronic kidney disease: Secondary | ICD-10-CM | POA: Diagnosis not present

## 2023-05-17 DIAGNOSIS — R197 Diarrhea, unspecified: Secondary | ICD-10-CM | POA: Diagnosis not present

## 2023-05-18 ENCOUNTER — Ambulatory Visit (INDEPENDENT_AMBULATORY_CARE_PROVIDER_SITE_OTHER): Payer: Medicare Other | Admitting: Podiatry

## 2023-05-18 ENCOUNTER — Encounter: Payer: Self-pay | Admitting: Podiatry

## 2023-05-18 DIAGNOSIS — E1169 Type 2 diabetes mellitus with other specified complication: Secondary | ICD-10-CM

## 2023-05-18 DIAGNOSIS — M79674 Pain in right toe(s): Secondary | ICD-10-CM

## 2023-05-18 DIAGNOSIS — Z794 Long term (current) use of insulin: Secondary | ICD-10-CM | POA: Diagnosis not present

## 2023-05-18 DIAGNOSIS — B351 Tinea unguium: Secondary | ICD-10-CM

## 2023-05-18 DIAGNOSIS — M79675 Pain in left toe(s): Secondary | ICD-10-CM

## 2023-05-18 NOTE — Progress Notes (Signed)
Subjective:  Patient ID: [name], male    DOB: 1941-04-19,   MRN: [id number]  Chief Complaint  Patient presents with   [facility]    RM#22 [facility] no concerns today just foot care.    82 y.o. male presents for concern of thickened elongated and painful nails that are difficult to trim. Requesting to have them trimmed today. Denies burning and tingling in their feet. Patient is diabetic and last A1c was  Lab Results  Component Value Date   HGBA1C 7.1 (H) 09/01/2022   .   PCP:  Emilio Aspen, MD    . Denies any other pedal complaints. Denies n/v/f/c.   Past Medical History:  Diagnosis Date   Acute meniscal tear of right knee    Arthritis    Cancer (HCC) 2015   lymphoma new dx area removed behind right ear   Cancer (HCC) 2010   prostate   Deviated septum    chronic sinusitis, nasal turbinate hypertrophy   Diabetes mellitus without complication (HCC)    on metformin   GERD (gastroesophageal reflux disease)    Hypertension    Pneumonia    Sleep apnea    wears CPAP   Wears glasses     Objective:  Vascular: DP/PT pulses 2/4 bilateral. CFT <3 seconds. Absent hair growth on digits. Edema noted to bilateral lower extremities. Xerosis noted bilaterally.  Skin. No lacerations or abrasions bilateral feet. Nails 1-5 bilateral  are thickened discolored and elongated with subungual debris.  Musculoskeletal: MMT 5/5 bilateral lower extremities in DF, PF, Inversion and Eversion. Deceased ROM in DF of ankle joint.  Neurological: Sensation intact to light touch. Protective sensation diminished bilateral.    Assessment:   1. Pain due to onychomycosis of toenails of both feet   2. Type 2 diabetes mellitus with other specified complication, with long-term current use of insulin (HCC)       Plan:  Patient was evaluated and treated and all questions answered. -Discussed and educated patient on diabetic foot care, especially with  regards to the vascular, neurological and  musculoskeletal systems.  -Stressed the importance of good glycemic control and the detriment of not  controlling glucose levels in relation to the foot. -Discussed supportive shoes at all times and checking feet regularly.  -Mechanically debrided all nails 1-5 bilateral using sterile nail nipper and filed with dremel without incident  -Answered all patient questions -Patient to return  in 3 months for at risk foot care -Patient advised to call the office if any problems or questions arise in the meantime.   Louann Sjogren, DPM

## 2023-06-03 DIAGNOSIS — M47816 Spondylosis without myelopathy or radiculopathy, lumbar region: Secondary | ICD-10-CM | POA: Diagnosis not present

## 2023-06-03 DIAGNOSIS — M5126 Other intervertebral disc displacement, lumbar region: Secondary | ICD-10-CM | POA: Diagnosis not present

## 2023-06-03 DIAGNOSIS — M48061 Spinal stenosis, lumbar region without neurogenic claudication: Secondary | ICD-10-CM | POA: Diagnosis not present

## 2023-06-04 DIAGNOSIS — E1122 Type 2 diabetes mellitus with diabetic chronic kidney disease: Secondary | ICD-10-CM | POA: Diagnosis not present

## 2023-06-04 DIAGNOSIS — C61 Malignant neoplasm of prostate: Secondary | ICD-10-CM | POA: Diagnosis not present

## 2023-06-04 DIAGNOSIS — I129 Hypertensive chronic kidney disease with stage 1 through stage 4 chronic kidney disease, or unspecified chronic kidney disease: Secondary | ICD-10-CM | POA: Diagnosis not present

## 2023-06-04 DIAGNOSIS — R809 Proteinuria, unspecified: Secondary | ICD-10-CM | POA: Diagnosis not present

## 2023-06-24 DIAGNOSIS — M5126 Other intervertebral disc displacement, lumbar region: Secondary | ICD-10-CM | POA: Diagnosis not present

## 2023-06-24 DIAGNOSIS — M48062 Spinal stenosis, lumbar region with neurogenic claudication: Secondary | ICD-10-CM | POA: Diagnosis not present

## 2023-06-28 DIAGNOSIS — M48062 Spinal stenosis, lumbar region with neurogenic claudication: Secondary | ICD-10-CM | POA: Diagnosis not present

## 2023-07-05 DIAGNOSIS — I129 Hypertensive chronic kidney disease with stage 1 through stage 4 chronic kidney disease, or unspecified chronic kidney disease: Secondary | ICD-10-CM | POA: Diagnosis not present

## 2023-07-05 DIAGNOSIS — K219 Gastro-esophageal reflux disease without esophagitis: Secondary | ICD-10-CM | POA: Diagnosis not present

## 2023-07-05 DIAGNOSIS — I700 Atherosclerosis of aorta: Secondary | ICD-10-CM | POA: Diagnosis not present

## 2023-07-05 DIAGNOSIS — N1831 Chronic kidney disease, stage 3a: Secondary | ICD-10-CM | POA: Diagnosis not present

## 2023-07-05 DIAGNOSIS — M48062 Spinal stenosis, lumbar region with neurogenic claudication: Secondary | ICD-10-CM | POA: Diagnosis not present

## 2023-07-05 DIAGNOSIS — E1169 Type 2 diabetes mellitus with other specified complication: Secondary | ICD-10-CM | POA: Diagnosis not present

## 2023-07-05 DIAGNOSIS — C8590 Non-Hodgkin lymphoma, unspecified, unspecified site: Secondary | ICD-10-CM | POA: Diagnosis not present

## 2023-07-05 DIAGNOSIS — R801 Persistent proteinuria, unspecified: Secondary | ICD-10-CM | POA: Diagnosis not present

## 2023-07-05 DIAGNOSIS — M5442 Lumbago with sciatica, left side: Secondary | ICD-10-CM | POA: Diagnosis not present

## 2023-07-05 DIAGNOSIS — R197 Diarrhea, unspecified: Secondary | ICD-10-CM | POA: Diagnosis not present

## 2023-07-05 DIAGNOSIS — D649 Anemia, unspecified: Secondary | ICD-10-CM | POA: Diagnosis not present

## 2023-07-05 DIAGNOSIS — E1121 Type 2 diabetes mellitus with diabetic nephropathy: Secondary | ICD-10-CM | POA: Diagnosis not present

## 2023-08-18 ENCOUNTER — Ambulatory Visit (INDEPENDENT_AMBULATORY_CARE_PROVIDER_SITE_OTHER): Payer: Medicare Other | Admitting: Podiatry

## 2023-08-18 ENCOUNTER — Encounter: Payer: Self-pay | Admitting: Podiatry

## 2023-08-18 DIAGNOSIS — B351 Tinea unguium: Secondary | ICD-10-CM

## 2023-08-18 DIAGNOSIS — E1169 Type 2 diabetes mellitus with other specified complication: Secondary | ICD-10-CM

## 2023-08-18 DIAGNOSIS — M79675 Pain in left toe(s): Secondary | ICD-10-CM

## 2023-08-18 DIAGNOSIS — M79674 Pain in right toe(s): Secondary | ICD-10-CM | POA: Diagnosis not present

## 2023-08-18 DIAGNOSIS — Z794 Long term (current) use of insulin: Secondary | ICD-10-CM

## 2023-08-18 NOTE — Progress Notes (Signed)
  Subjective:  Patient ID: [name], male    DOB: [birth year],   MRN: [id number]  No chief complaint on file.   83 y.o. male presents for concern of thickened elongated and painful nails that are difficult to trim. Requesting to have them trimmed today. Denies burning and tingling in their feet. Patient is diabetic and last A1c was  Lab Results  Component Value Date   HGBA1C 7.1 (H) 09/01/2022   .   PCP:  Emilio Aspen, MD    . Denies any other pedal complaints. Denies n/v/f/c.   Past Medical History:  Diagnosis Date   Acute meniscal tear of right knee    Arthritis    Cancer (HCC) 2015   lymphoma new dx area removed behind right ear   Cancer (HCC) 2010   prostate   Deviated septum    chronic sinusitis, nasal turbinate hypertrophy   Diabetes mellitus without complication (HCC)    on metformin   GERD (gastroesophageal reflux disease)    Hypertension    Pneumonia    Sleep apnea    wears CPAP   Wears glasses     Objective:  Vascular: DP/PT pulses 2/4 bilateral. CFT <3 seconds. Absent hair growth on digits. Edema noted to bilateral lower extremities. Xerosis noted bilaterally.  Skin. No lacerations or abrasions bilateral feet. Nails 1-5 bilateral  are thickened discolored and elongated with subungual debris.  Musculoskeletal: MMT 5/5 bilateral lower extremities in DF, PF, Inversion and Eversion. Deceased ROM in DF of ankle joint.  Neurological: Sensation intact to light touch. Protective sensation diminished bilateral.    Assessment:   1. Pain due to onychomycosis of toenails of both feet   2. Type 2 diabetes mellitus with other specified complication, with long-term current use of insulin (HCC)       Plan:  Patient was evaluated and treated and all questions answered. -Discussed and educated patient on diabetic foot care, especially with  regards to the vascular, neurological and musculoskeletal systems.  -Stressed the importance of good glycemic  control and the detriment of not  controlling glucose levels in relation to the foot. -Discussed supportive shoes at all times and checking feet regularly.  -Mechanically debrided all nails 1-5 bilateral using sterile nail nipper and filed with dremel without incident  -Answered all patient questions -Patient to return  in 3 months for at risk foot care -Patient advised to call the office if any problems or questions arise in the meantime.   Louann Sjogren, DPM

## 2023-08-19 DIAGNOSIS — E1169 Type 2 diabetes mellitus with other specified complication: Secondary | ICD-10-CM | POA: Diagnosis not present

## 2023-08-19 DIAGNOSIS — R197 Diarrhea, unspecified: Secondary | ICD-10-CM | POA: Diagnosis not present

## 2023-08-25 DIAGNOSIS — Z6830 Body mass index (BMI) 30.0-30.9, adult: Secondary | ICD-10-CM | POA: Diagnosis not present

## 2023-08-25 DIAGNOSIS — M5126 Other intervertebral disc displacement, lumbar region: Secondary | ICD-10-CM | POA: Diagnosis not present

## 2023-08-30 DIAGNOSIS — Z961 Presence of intraocular lens: Secondary | ICD-10-CM | POA: Diagnosis not present

## 2023-08-30 DIAGNOSIS — E119 Type 2 diabetes mellitus without complications: Secondary | ICD-10-CM | POA: Diagnosis not present

## 2023-08-30 DIAGNOSIS — H40053 Ocular hypertension, bilateral: Secondary | ICD-10-CM | POA: Diagnosis not present

## 2023-08-30 DIAGNOSIS — H52203 Unspecified astigmatism, bilateral: Secondary | ICD-10-CM | POA: Diagnosis not present

## 2023-09-13 DIAGNOSIS — M48062 Spinal stenosis, lumbar region with neurogenic claudication: Secondary | ICD-10-CM | POA: Diagnosis not present

## 2023-10-20 DIAGNOSIS — I700 Atherosclerosis of aorta: Secondary | ICD-10-CM | POA: Diagnosis not present

## 2023-10-20 DIAGNOSIS — Z0000 Encounter for general adult medical examination without abnormal findings: Secondary | ICD-10-CM | POA: Diagnosis not present

## 2023-10-20 DIAGNOSIS — Z79899 Other long term (current) drug therapy: Secondary | ICD-10-CM | POA: Diagnosis not present

## 2023-10-20 DIAGNOSIS — E1169 Type 2 diabetes mellitus with other specified complication: Secondary | ICD-10-CM | POA: Diagnosis not present

## 2023-10-20 DIAGNOSIS — M5442 Lumbago with sciatica, left side: Secondary | ICD-10-CM | POA: Diagnosis not present

## 2023-10-20 DIAGNOSIS — C8590 Non-Hodgkin lymphoma, unspecified, unspecified site: Secondary | ICD-10-CM | POA: Diagnosis not present

## 2023-10-20 DIAGNOSIS — E1121 Type 2 diabetes mellitus with diabetic nephropathy: Secondary | ICD-10-CM | POA: Diagnosis not present

## 2023-10-20 DIAGNOSIS — N1831 Chronic kidney disease, stage 3a: Secondary | ICD-10-CM | POA: Diagnosis not present

## 2023-10-20 DIAGNOSIS — E559 Vitamin D deficiency, unspecified: Secondary | ICD-10-CM | POA: Diagnosis not present

## 2023-10-20 DIAGNOSIS — I129 Hypertensive chronic kidney disease with stage 1 through stage 4 chronic kidney disease, or unspecified chronic kidney disease: Secondary | ICD-10-CM | POA: Diagnosis not present

## 2023-10-20 DIAGNOSIS — D649 Anemia, unspecified: Secondary | ICD-10-CM | POA: Diagnosis not present

## 2023-10-20 DIAGNOSIS — Z1331 Encounter for screening for depression: Secondary | ICD-10-CM | POA: Diagnosis not present

## 2023-10-20 DIAGNOSIS — K219 Gastro-esophageal reflux disease without esophagitis: Secondary | ICD-10-CM | POA: Diagnosis not present

## 2023-10-20 DIAGNOSIS — E7800 Pure hypercholesterolemia, unspecified: Secondary | ICD-10-CM | POA: Diagnosis not present

## 2023-10-20 DIAGNOSIS — M48062 Spinal stenosis, lumbar region with neurogenic claudication: Secondary | ICD-10-CM | POA: Diagnosis not present

## 2023-10-20 DIAGNOSIS — R801 Persistent proteinuria, unspecified: Secondary | ICD-10-CM | POA: Diagnosis not present

## 2023-10-28 DIAGNOSIS — M48062 Spinal stenosis, lumbar region with neurogenic claudication: Secondary | ICD-10-CM | POA: Diagnosis not present

## 2023-10-28 DIAGNOSIS — Z6831 Body mass index (BMI) 31.0-31.9, adult: Secondary | ICD-10-CM | POA: Diagnosis not present

## 2023-11-17 ENCOUNTER — Encounter: Payer: Self-pay | Admitting: Podiatry

## 2023-11-17 ENCOUNTER — Ambulatory Visit (INDEPENDENT_AMBULATORY_CARE_PROVIDER_SITE_OTHER): Payer: Medicare Other | Admitting: Podiatry

## 2023-11-17 DIAGNOSIS — M79674 Pain in right toe(s): Secondary | ICD-10-CM

## 2023-11-17 DIAGNOSIS — Z794 Long term (current) use of insulin: Secondary | ICD-10-CM

## 2023-11-17 DIAGNOSIS — B351 Tinea unguium: Secondary | ICD-10-CM | POA: Diagnosis not present

## 2023-11-17 DIAGNOSIS — E1169 Type 2 diabetes mellitus with other specified complication: Secondary | ICD-10-CM

## 2023-11-17 DIAGNOSIS — M79675 Pain in left toe(s): Secondary | ICD-10-CM

## 2023-11-17 NOTE — Progress Notes (Signed)
  Subjective:  Patient ID: [name], male    DOB: [birth year],   MRN: [id number]  No chief complaint on file.   83 y.o. male presents for concern of thickened elongated and painful nails that are difficult to trim. Requesting to have them trimmed today. Denies burning and tingling in their feet. Patient is diabetic and last A1c was  Lab Results  Component Value Date   HGBA1C 7.1 (H) 09/01/2022   .   PCP:  Emilio Aspen, MD    . Denies any other pedal complaints. Denies n/v/f/c.   Past Medical History:  Diagnosis Date   Acute meniscal tear of right knee    Arthritis    Cancer (HCC) 2015   lymphoma new dx area removed behind right ear   Cancer (HCC) 2010   prostate   Deviated septum    chronic sinusitis, nasal turbinate hypertrophy   Diabetes mellitus without complication (HCC)    on metformin   GERD (gastroesophageal reflux disease)    Hypertension    Pneumonia    Sleep apnea    wears CPAP   Wears glasses     Objective:  Vascular: DP/PT pulses 2/4 bilateral. CFT <3 seconds. Absent hair growth on digits. Edema noted to bilateral lower extremities. Xerosis noted bilaterally.  Skin. No lacerations or abrasions bilateral feet. Nails 1-5 bilateral  are thickened discolored and elongated with subungual debris.  Musculoskeletal: MMT 5/5 bilateral lower extremities in DF, PF, Inversion and Eversion. Deceased ROM in DF of ankle joint.  Neurological: Sensation intact to light touch. Protective sensation diminished bilateral.    Assessment:   1. Pain due to onychomycosis of toenails of both feet   2. Type 2 diabetes mellitus with other specified complication, with long-term current use of insulin (HCC)       Plan:  Patient was evaluated and treated and all questions answered. -Discussed and educated patient on diabetic foot care, especially with  regards to the vascular, neurological and musculoskeletal systems.  -Stressed the importance of good glycemic  control and the detriment of not  controlling glucose levels in relation to the foot. -Discussed supportive shoes at all times and checking feet regularly.  -Mechanically debrided all nails 1-5 bilateral using sterile nail nipper and filed with dremel without incident  -Answered all patient questions -Patient to return  in 3 months for at risk foot care -Patient advised to call the office if any problems or questions arise in the meantime.   Louann Sjogren, DPM

## 2023-12-06 DIAGNOSIS — I129 Hypertensive chronic kidney disease with stage 1 through stage 4 chronic kidney disease, or unspecified chronic kidney disease: Secondary | ICD-10-CM | POA: Diagnosis not present

## 2023-12-09 DIAGNOSIS — H16213 Exposure keratoconjunctivitis, bilateral: Secondary | ICD-10-CM | POA: Diagnosis not present

## 2023-12-09 DIAGNOSIS — H02834 Dermatochalasis of left upper eyelid: Secondary | ICD-10-CM | POA: Diagnosis not present

## 2023-12-09 DIAGNOSIS — H02135 Senile ectropion of left lower eyelid: Secondary | ICD-10-CM | POA: Diagnosis not present

## 2023-12-09 DIAGNOSIS — H02132 Senile ectropion of right lower eyelid: Secondary | ICD-10-CM | POA: Diagnosis not present

## 2023-12-09 DIAGNOSIS — H02421 Myogenic ptosis of right eyelid: Secondary | ICD-10-CM | POA: Diagnosis not present

## 2023-12-09 DIAGNOSIS — H04563 Stenosis of bilateral lacrimal punctum: Secondary | ICD-10-CM | POA: Diagnosis not present

## 2023-12-09 DIAGNOSIS — H04223 Epiphora due to insufficient drainage, bilateral lacrimal glands: Secondary | ICD-10-CM | POA: Diagnosis not present

## 2023-12-09 DIAGNOSIS — H57813 Brow ptosis, bilateral: Secondary | ICD-10-CM | POA: Diagnosis not present

## 2023-12-09 DIAGNOSIS — H02423 Myogenic ptosis of bilateral eyelids: Secondary | ICD-10-CM | POA: Diagnosis not present

## 2023-12-09 DIAGNOSIS — H02422 Myogenic ptosis of left eyelid: Secondary | ICD-10-CM | POA: Diagnosis not present

## 2023-12-09 DIAGNOSIS — H04123 Dry eye syndrome of bilateral lacrimal glands: Secondary | ICD-10-CM | POA: Diagnosis not present

## 2023-12-09 DIAGNOSIS — H02831 Dermatochalasis of right upper eyelid: Secondary | ICD-10-CM | POA: Diagnosis not present

## 2023-12-14 DIAGNOSIS — M48062 Spinal stenosis, lumbar region with neurogenic claudication: Secondary | ICD-10-CM | POA: Diagnosis not present

## 2023-12-15 DIAGNOSIS — E1122 Type 2 diabetes mellitus with diabetic chronic kidney disease: Secondary | ICD-10-CM | POA: Diagnosis not present

## 2023-12-15 DIAGNOSIS — D582 Other hemoglobinopathies: Secondary | ICD-10-CM | POA: Diagnosis not present

## 2023-12-15 DIAGNOSIS — I129 Hypertensive chronic kidney disease with stage 1 through stage 4 chronic kidney disease, or unspecified chronic kidney disease: Secondary | ICD-10-CM | POA: Diagnosis not present

## 2023-12-15 DIAGNOSIS — R809 Proteinuria, unspecified: Secondary | ICD-10-CM | POA: Diagnosis not present

## 2023-12-15 DIAGNOSIS — E669 Obesity, unspecified: Secondary | ICD-10-CM | POA: Diagnosis not present

## 2023-12-15 DIAGNOSIS — E7800 Pure hypercholesterolemia, unspecified: Secondary | ICD-10-CM | POA: Diagnosis not present

## 2023-12-29 DIAGNOSIS — H04223 Epiphora due to insufficient drainage, bilateral lacrimal glands: Secondary | ICD-10-CM | POA: Diagnosis not present

## 2023-12-29 DIAGNOSIS — H02834 Dermatochalasis of left upper eyelid: Secondary | ICD-10-CM | POA: Diagnosis not present

## 2023-12-29 DIAGNOSIS — H02135 Senile ectropion of left lower eyelid: Secondary | ICD-10-CM | POA: Diagnosis not present

## 2023-12-29 DIAGNOSIS — H02422 Myogenic ptosis of left eyelid: Secondary | ICD-10-CM | POA: Diagnosis not present

## 2023-12-29 DIAGNOSIS — H02535 Eyelid retraction left lower eyelid: Secondary | ICD-10-CM | POA: Diagnosis not present

## 2023-12-29 DIAGNOSIS — H02532 Eyelid retraction right lower eyelid: Secondary | ICD-10-CM | POA: Diagnosis not present

## 2023-12-29 DIAGNOSIS — H02421 Myogenic ptosis of right eyelid: Secondary | ICD-10-CM | POA: Diagnosis not present

## 2023-12-29 DIAGNOSIS — H02423 Myogenic ptosis of bilateral eyelids: Secondary | ICD-10-CM | POA: Diagnosis not present

## 2023-12-29 DIAGNOSIS — H02132 Senile ectropion of right lower eyelid: Secondary | ICD-10-CM | POA: Diagnosis not present

## 2023-12-29 DIAGNOSIS — H16213 Exposure keratoconjunctivitis, bilateral: Secondary | ICD-10-CM | POA: Diagnosis not present

## 2023-12-29 DIAGNOSIS — H04563 Stenosis of bilateral lacrimal punctum: Secondary | ICD-10-CM | POA: Diagnosis not present

## 2023-12-29 DIAGNOSIS — H02831 Dermatochalasis of right upper eyelid: Secondary | ICD-10-CM | POA: Diagnosis not present

## 2024-01-25 ENCOUNTER — Inpatient Hospital Stay: Payer: Medicare Other | Attending: Oncology | Admitting: Oncology

## 2024-01-25 VITALS — BP 132/70 | HR 60 | Temp 98.2°F | Resp 18 | Ht 71.0 in | Wt 223.0 lb

## 2024-01-25 DIAGNOSIS — C8300 Small cell B-cell lymphoma, unspecified site: Secondary | ICD-10-CM | POA: Diagnosis not present

## 2024-01-25 DIAGNOSIS — C884 Extranodal marginal zone b-cell lymphoma of mucosa-associated lymphoid tissue (malt-lymphoma) not having achieved remission: Secondary | ICD-10-CM | POA: Diagnosis not present

## 2024-01-25 DIAGNOSIS — G8929 Other chronic pain: Secondary | ICD-10-CM | POA: Diagnosis not present

## 2024-01-25 DIAGNOSIS — Z9079 Acquired absence of other genital organ(s): Secondary | ICD-10-CM | POA: Insufficient documentation

## 2024-01-25 DIAGNOSIS — E785 Hyperlipidemia, unspecified: Secondary | ICD-10-CM | POA: Insufficient documentation

## 2024-01-25 DIAGNOSIS — Z79899 Other long term (current) drug therapy: Secondary | ICD-10-CM | POA: Insufficient documentation

## 2024-01-25 DIAGNOSIS — E119 Type 2 diabetes mellitus without complications: Secondary | ICD-10-CM | POA: Insufficient documentation

## 2024-01-25 DIAGNOSIS — I10 Essential (primary) hypertension: Secondary | ICD-10-CM | POA: Insufficient documentation

## 2024-01-25 NOTE — Progress Notes (Signed)
  [name]   Diagnosis: Marginal zone lymphoma  INTERVAL HISTORY:   [name] returns as scheduled.  He feels well.  No fever or night sweats.  Good appetite.  He has chronic back pain.  Objective:  Vital signs in last 24 hours:  Blood pressure 132/70, pulse 60, temperature 98.2 F (36.8 C), resp. rate 18, height 5' 11 (1.803 m), weight 223 lb (101.2 kg), SpO2 100%.    HEENT: No evidence of recurrent tumor at the right postauricular region Lymphatics: No cervical, supraclavicular, axillary, or inguinal nodes Resp: Lungs clear bilaterally Cardio: Regular rate and rhythm GI: No hepatosplenomegaly Vascular: Trace lower leg edema bilaterally  Lab Results:  Lab Results  Component Value Date   WBC 6.6 09/01/2022   HGB 12.6 (L) 09/01/2022   HCT 37.4 (L) 09/01/2022   MCV 92.1 09/01/2022   PLT 190 09/01/2022   NEUTROABS 4.4 04/30/2014    CMP  Lab Results  Component Value Date   NA 136 09/01/2022   K 4.2 09/01/2022   CL 105 09/01/2022   CO2 25 09/01/2022   GLUCOSE 170 (H) 09/01/2022   BUN 13 09/01/2022   CREATININE 1.12 09/01/2022   CALCIUM  9.0 09/01/2022   PROT 8.3 04/30/2014   ALBUMIN 4.2 04/30/2014   AST 18 04/30/2014   ALT 22 04/30/2014   ALKPHOS 58 04/30/2014   BILITOT 0.35 04/30/2014   GFRNONAA >60 09/01/2022   GFRAA >60 03/30/2016     Medications: I have reviewed the patient's current medications.   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 Prostate cancer diagnosed in 2009, status post prostatectomy   3.   Diabetes   4.   Hypertension   5.   hyperlipidemiq   6.  Zoster rash right forehead/periorbital region April 2018  7.  Positional vertigo  June 2021  8.  Left L4-5 foraminotomy 09/04/2022      Disposition: [name] remains in clinical remission from the marginal zone lymphoma.  He would like to continue follow-up in the oncology clinic.  He will return for an office visit in 1 year.  Arley Hof, MD  01/25/2024  12:50 PM

## 2024-01-27 DIAGNOSIS — M48062 Spinal stenosis, lumbar region with neurogenic claudication: Secondary | ICD-10-CM | POA: Diagnosis not present

## 2024-02-16 ENCOUNTER — Encounter: Payer: Self-pay | Admitting: Podiatry

## 2024-02-16 ENCOUNTER — Ambulatory Visit (INDEPENDENT_AMBULATORY_CARE_PROVIDER_SITE_OTHER): Admitting: Podiatry

## 2024-02-16 DIAGNOSIS — E1169 Type 2 diabetes mellitus with other specified complication: Secondary | ICD-10-CM

## 2024-02-16 DIAGNOSIS — Z794 Long term (current) use of insulin: Secondary | ICD-10-CM

## 2024-02-16 DIAGNOSIS — M79674 Pain in right toe(s): Secondary | ICD-10-CM | POA: Diagnosis not present

## 2024-02-16 DIAGNOSIS — B351 Tinea unguium: Secondary | ICD-10-CM

## 2024-02-16 DIAGNOSIS — M79675 Pain in left toe(s): Secondary | ICD-10-CM

## 2024-02-16 NOTE — Progress Notes (Signed)
  Subjective:  Patient ID: [name], male    DOB: 10-Mar-1941,   MRN: [id number]  Chief Complaint  Patient presents with   Diabetes    Cut my toenails.  Dr. Dorn Sauers - 10/20/2023; A1c - 7.1    83 y.o. male presents for concern of thickened elongated and painful nails that are difficult to trim. Requesting to have them trimmed today. Denies burning and tingling in their feet. Patient is diabetic and last A1c was  Lab Results  Component Value Date   HGBA1C 7.1 (H) 09/01/2022   .   PCP:  Sauers Dorn LABOR, MD    . Denies any other pedal complaints. Denies n/v/f/c.   Past Medical History:  Diagnosis Date   Acute meniscal tear of right knee    Arthritis    Cancer (HCC) 2015   lymphoma new dx area removed behind right ear   Cancer (HCC) 2010   prostate   Deviated septum    chronic sinusitis, nasal turbinate hypertrophy   Diabetes mellitus without complication (HCC)    on metformin    GERD (gastroesophageal reflux disease)    Hypertension    Pneumonia    Sleep apnea    wears CPAP   Wears glasses     Objective:  Vascular: DP/PT pulses 2/4 bilateral. CFT <3 seconds. Absent hair growth on digits. Edema noted to bilateral lower extremities. Xerosis noted bilaterally.  Skin. No lacerations or abrasions bilateral feet. Nails 1-5 bilateral  are thickened discolored and elongated with subungual debris.  Musculoskeletal: MMT 5/5 bilateral lower extremities in DF, PF, Inversion and Eversion. Deceased ROM in DF of ankle joint.  Neurological: Sensation intact to light touch. Protective sensation diminished bilateral.    Assessment:   1. Pain due to onychomycosis of toenails of both feet   2. Type 2 diabetes mellitus with other specified complication, with long-term current use of insulin  (HCC)       Plan:  Patient was evaluated and treated and all questions answered. -Discussed and educated patient on diabetic foot care, especially with  regards to the  vascular, neurological and musculoskeletal systems.  -Stressed the importance of good glycemic control and the detriment of not  controlling glucose levels in relation to the foot. -Discussed supportive shoes at all times and checking feet regularly.  -Mechanically debrided all nails 1-5 bilateral using sterile nail nipper and filed with dremel without incident  -Answered all patient questions -Patient to return  in 3 months for at risk foot care -Patient advised to call the office if any problems or questions arise in the meantime.   [name], DPM

## 2024-02-24 DIAGNOSIS — H40053 Ocular hypertension, bilateral: Secondary | ICD-10-CM | POA: Diagnosis not present

## 2024-03-09 DIAGNOSIS — L720 Epidermal cyst: Secondary | ICD-10-CM | POA: Diagnosis not present

## 2024-03-09 DIAGNOSIS — L821 Other seborrheic keratosis: Secondary | ICD-10-CM | POA: Diagnosis not present

## 2024-03-09 DIAGNOSIS — D225 Melanocytic nevi of trunk: Secondary | ICD-10-CM | POA: Diagnosis not present

## 2024-03-09 DIAGNOSIS — D2271 Melanocytic nevi of right lower limb, including hip: Secondary | ICD-10-CM | POA: Diagnosis not present

## 2024-03-31 DIAGNOSIS — R202 Paresthesia of skin: Secondary | ICD-10-CM | POA: Diagnosis not present

## 2024-03-31 DIAGNOSIS — C8590 Non-Hodgkin lymphoma, unspecified, unspecified site: Secondary | ICD-10-CM | POA: Diagnosis not present

## 2024-03-31 DIAGNOSIS — E1169 Type 2 diabetes mellitus with other specified complication: Secondary | ICD-10-CM | POA: Diagnosis not present

## 2024-03-31 DIAGNOSIS — G4733 Obstructive sleep apnea (adult) (pediatric): Secondary | ICD-10-CM | POA: Diagnosis not present

## 2024-03-31 DIAGNOSIS — N1831 Chronic kidney disease, stage 3a: Secondary | ICD-10-CM | POA: Diagnosis not present

## 2024-03-31 DIAGNOSIS — D649 Anemia, unspecified: Secondary | ICD-10-CM | POA: Diagnosis not present

## 2024-04-21 DIAGNOSIS — Z23 Encounter for immunization: Secondary | ICD-10-CM | POA: Diagnosis not present

## 2024-04-21 DIAGNOSIS — I129 Hypertensive chronic kidney disease with stage 1 through stage 4 chronic kidney disease, or unspecified chronic kidney disease: Secondary | ICD-10-CM | POA: Diagnosis not present

## 2024-04-21 DIAGNOSIS — C8590 Non-Hodgkin lymphoma, unspecified, unspecified site: Secondary | ICD-10-CM | POA: Diagnosis not present

## 2024-04-21 DIAGNOSIS — E1169 Type 2 diabetes mellitus with other specified complication: Secondary | ICD-10-CM | POA: Diagnosis not present

## 2024-04-21 DIAGNOSIS — M48062 Spinal stenosis, lumbar region with neurogenic claudication: Secondary | ICD-10-CM | POA: Diagnosis not present

## 2024-04-21 DIAGNOSIS — R202 Paresthesia of skin: Secondary | ICD-10-CM | POA: Diagnosis not present

## 2024-04-21 DIAGNOSIS — K5901 Slow transit constipation: Secondary | ICD-10-CM | POA: Diagnosis not present

## 2024-04-21 DIAGNOSIS — M5442 Lumbago with sciatica, left side: Secondary | ICD-10-CM | POA: Diagnosis not present

## 2024-04-21 DIAGNOSIS — E1121 Type 2 diabetes mellitus with diabetic nephropathy: Secondary | ICD-10-CM | POA: Diagnosis not present

## 2024-04-21 DIAGNOSIS — N1831 Chronic kidney disease, stage 3a: Secondary | ICD-10-CM | POA: Diagnosis not present

## 2024-04-26 DIAGNOSIS — M48062 Spinal stenosis, lumbar region with neurogenic claudication: Secondary | ICD-10-CM | POA: Diagnosis not present

## 2024-05-08 DIAGNOSIS — M48062 Spinal stenosis, lumbar region with neurogenic claudication: Secondary | ICD-10-CM | POA: Diagnosis not present

## 2024-05-11 DIAGNOSIS — R350 Frequency of micturition: Secondary | ICD-10-CM | POA: Diagnosis not present

## 2024-05-11 DIAGNOSIS — N3942 Incontinence without sensory awareness: Secondary | ICD-10-CM | POA: Diagnosis not present

## 2024-05-22 ENCOUNTER — Ambulatory Visit: Admitting: Podiatry

## 2024-05-22 ENCOUNTER — Encounter: Payer: Self-pay | Admitting: Podiatry

## 2024-05-22 DIAGNOSIS — B351 Tinea unguium: Secondary | ICD-10-CM

## 2024-05-22 DIAGNOSIS — Z794 Long term (current) use of insulin: Secondary | ICD-10-CM | POA: Diagnosis not present

## 2024-05-22 DIAGNOSIS — M79674 Pain in right toe(s): Secondary | ICD-10-CM

## 2024-05-22 DIAGNOSIS — M79675 Pain in left toe(s): Secondary | ICD-10-CM

## 2024-05-22 DIAGNOSIS — E1169 Type 2 diabetes mellitus with other specified complication: Secondary | ICD-10-CM | POA: Diagnosis not present

## 2024-05-22 NOTE — Progress Notes (Signed)
  Subjective:  Patient ID: [name], male    DOB: 08-16-1940,   MRN: [id number]  Chief Complaint  Patient presents with   Diabetes    Trim my nails.  Dr. Dorn Sauers -04/2024; A1c - 7.2    83 y.o. male presents for concern of thickened elongated and painful nails that are difficult to trim. Requesting to have them trimmed today. Denies burning and tingling in their feet. Patient is diabetic and last A1c was  Lab Results  Component Value Date   HGBA1C 7.1 (H) 09/01/2022   .   PCP:  Sauers Dorn LABOR, MD    . Denies any other pedal complaints. Denies n/v/f/c.   Past Medical History:  Diagnosis Date   Acute meniscal tear of right knee    Arthritis    Cancer (HCC) 2015   lymphoma new dx area removed behind right ear   Cancer (HCC) 2010   prostate   Deviated septum    chronic sinusitis, nasal turbinate hypertrophy   Diabetes mellitus without complication (HCC)    on metformin    GERD (gastroesophageal reflux disease)    Hypertension    Pneumonia    Sleep apnea    wears CPAP   Wears glasses     Objective:  Vascular: DP/PT pulses 2/4 bilateral. CFT <3 seconds. Absent hair growth on digits. Edema noted to bilateral lower extremities. Xerosis noted bilaterally.  Skin. No lacerations or abrasions bilateral feet. Nails 1-5 bilateral  are thickened discolored and elongated with subungual debris.  Musculoskeletal: MMT 5/5 bilateral lower extremities in DF, PF, Inversion and Eversion. Deceased ROM in DF of ankle joint.  Neurological: Sensation intact to light touch. Protective sensation diminished bilateral.    Assessment:   1. Pain due to onychomycosis of toenails of both feet   2. Type 2 diabetes mellitus with other specified complication, with long-term current use of insulin  (HCC)       Plan:  Patient was evaluated and treated and all questions answered. -Discussed and educated patient on diabetic foot care, especially with  regards to the  vascular, neurological and musculoskeletal systems.  -Stressed the importance of good glycemic control and the detriment of not  controlling glucose levels in relation to the foot. -Discussed supportive shoes at all times and checking feet regularly.  -Mechanically debrided all nails 1-5 bilateral using sterile nail nipper and filed with dremel without incident  -Answered all patient questions -Patient to return  in 3 months for at risk foot care -Patient advised to call the office if any problems or questions arise in the meantime.   Asberry Failing, DPM

## 2024-06-12 DIAGNOSIS — E1122 Type 2 diabetes mellitus with diabetic chronic kidney disease: Secondary | ICD-10-CM | POA: Diagnosis not present

## 2024-06-12 DIAGNOSIS — I129 Hypertensive chronic kidney disease with stage 1 through stage 4 chronic kidney disease, or unspecified chronic kidney disease: Secondary | ICD-10-CM | POA: Diagnosis not present

## 2024-06-19 DIAGNOSIS — R21 Rash and other nonspecific skin eruption: Secondary | ICD-10-CM | POA: Diagnosis not present

## 2024-06-19 DIAGNOSIS — E1122 Type 2 diabetes mellitus with diabetic chronic kidney disease: Secondary | ICD-10-CM | POA: Diagnosis not present

## 2024-06-19 DIAGNOSIS — R809 Proteinuria, unspecified: Secondary | ICD-10-CM | POA: Diagnosis not present

## 2024-06-19 DIAGNOSIS — I129 Hypertensive chronic kidney disease with stage 1 through stage 4 chronic kidney disease, or unspecified chronic kidney disease: Secondary | ICD-10-CM | POA: Diagnosis not present

## 2024-06-20 DIAGNOSIS — R21 Rash and other nonspecific skin eruption: Secondary | ICD-10-CM | POA: Diagnosis not present

## 2024-06-20 DIAGNOSIS — I129 Hypertensive chronic kidney disease with stage 1 through stage 4 chronic kidney disease, or unspecified chronic kidney disease: Secondary | ICD-10-CM | POA: Diagnosis not present

## 2024-06-28 DIAGNOSIS — L301 Dyshidrosis [pompholyx]: Secondary | ICD-10-CM | POA: Diagnosis not present

## 2024-06-28 DIAGNOSIS — L011 Impetiginization of other dermatoses: Secondary | ICD-10-CM | POA: Diagnosis not present

## 2024-06-28 DIAGNOSIS — L0889 Other specified local infections of the skin and subcutaneous tissue: Secondary | ICD-10-CM | POA: Diagnosis not present

## 2024-08-22 ENCOUNTER — Encounter: Payer: Self-pay | Admitting: Podiatry

## 2024-08-22 ENCOUNTER — Ambulatory Visit (INDEPENDENT_AMBULATORY_CARE_PROVIDER_SITE_OTHER): Admitting: Podiatry

## 2024-08-22 DIAGNOSIS — Z794 Long term (current) use of insulin: Secondary | ICD-10-CM

## 2024-08-22 DIAGNOSIS — E1169 Type 2 diabetes mellitus with other specified complication: Secondary | ICD-10-CM | POA: Diagnosis not present

## 2024-08-22 DIAGNOSIS — Z0189 Encounter for other specified special examinations: Secondary | ICD-10-CM

## 2024-08-22 DIAGNOSIS — E119 Type 2 diabetes mellitus without complications: Secondary | ICD-10-CM

## 2024-08-22 DIAGNOSIS — M79674 Pain in right toe(s): Secondary | ICD-10-CM

## 2024-08-22 DIAGNOSIS — B351 Tinea unguium: Secondary | ICD-10-CM | POA: Diagnosis not present

## 2024-08-22 DIAGNOSIS — M79675 Pain in left toe(s): Secondary | ICD-10-CM

## 2024-11-21 ENCOUNTER — Ambulatory Visit: Admitting: Podiatry

## 2025-01-25 ENCOUNTER — Ambulatory Visit: Admitting: Oncology
